# Patient Record
Sex: Female | Born: 1935
Health system: Southern US, Community
[De-identification: ages and names within clinical notes are randomized; demographics above are authoritative.]

## PROBLEM LIST (undated history)

## (undated) DIAGNOSIS — E871 Hypo-osmolality and hyponatremia: Secondary | ICD-10-CM

## (undated) DIAGNOSIS — I4891 Unspecified atrial fibrillation: Secondary | ICD-10-CM

## (undated) DIAGNOSIS — I509 Heart failure, unspecified: Secondary | ICD-10-CM

## (undated) DIAGNOSIS — I272 Pulmonary hypertension, unspecified: Secondary | ICD-10-CM

## (undated) DIAGNOSIS — M1990 Unspecified osteoarthritis, unspecified site: Secondary | ICD-10-CM

## (undated) DIAGNOSIS — E7800 Pure hypercholesterolemia, unspecified: Secondary | ICD-10-CM

## (undated) DIAGNOSIS — R011 Cardiac murmur, unspecified: Secondary | ICD-10-CM

## (undated) DIAGNOSIS — I10 Essential (primary) hypertension: Secondary | ICD-10-CM

## (undated) DIAGNOSIS — T7840XA Allergy, unspecified, initial encounter: Secondary | ICD-10-CM

## (undated) DIAGNOSIS — N189 Chronic kidney disease, unspecified: Secondary | ICD-10-CM

## (undated) DIAGNOSIS — E039 Hypothyroidism, unspecified: Secondary | ICD-10-CM

## (undated) DIAGNOSIS — I519 Heart disease, unspecified: Secondary | ICD-10-CM

## (undated) DIAGNOSIS — K219 Gastro-esophageal reflux disease without esophagitis: Secondary | ICD-10-CM

## (undated) DIAGNOSIS — Z872 Personal history of diseases of the skin and subcutaneous tissue: Secondary | ICD-10-CM

## (undated) HISTORY — PX: CORNEAL TRANSPLANT: SHX108

## (undated) HISTORY — DX: Unspecified atrial fibrillation: I48.91

## (undated) HISTORY — DX: Hypothyroidism, unspecified: E03.9

## (undated) HISTORY — DX: Pulmonary hypertension, unspecified: I27.20

## (undated) HISTORY — PX: ABDOMINAL HYSTERECTOMY: SHX81

## (undated) HISTORY — DX: Personal history of diseases of the skin and subcutaneous tissue: Z87.2

## (undated) HISTORY — DX: Pure hypercholesterolemia, unspecified: E78.00

## (undated) HISTORY — PX: KNEE SURGERY: SHX244

## (undated) HISTORY — PX: CHOLECYSTECTOMY: SHX55

## (undated) HISTORY — DX: Unspecified osteoarthritis, unspecified site: M19.90

## (undated) HISTORY — DX: Chronic kidney disease, unspecified: N18.9

## (undated) HISTORY — DX: Allergy, unspecified, initial encounter: T78.40XA

## (undated) HISTORY — DX: Cardiac murmur, unspecified: R01.1

## (undated) HISTORY — DX: Heart disease, unspecified: I51.9

## (undated) HISTORY — DX: Essential (primary) hypertension: I10

## (undated) HISTORY — DX: Gastro-esophageal reflux disease without esophagitis: K21.9

## (undated) HISTORY — DX: Heart failure, unspecified: I50.9

## (undated) HISTORY — PX: CYST REMOVAL TRUNK: SHX6283

## (undated) HISTORY — DX: Hypo-osmolality and hyponatremia: E87.1

---

## 2004-03-07 ENCOUNTER — Other Ambulatory Visit: Payer: Self-pay

## 2004-04-27 ENCOUNTER — Ambulatory Visit: Payer: Self-pay | Admitting: Unknown Physician Specialty

## 2004-09-20 ENCOUNTER — Inpatient Hospital Stay: Payer: Self-pay | Admitting: Internal Medicine

## 2004-09-20 ENCOUNTER — Other Ambulatory Visit: Payer: Self-pay

## 2005-01-29 ENCOUNTER — Ambulatory Visit: Payer: Self-pay | Admitting: Internal Medicine

## 2005-02-20 ENCOUNTER — Ambulatory Visit: Payer: Self-pay | Admitting: Unknown Physician Specialty

## 2006-01-30 ENCOUNTER — Ambulatory Visit: Payer: Self-pay | Admitting: Internal Medicine

## 2007-05-13 ENCOUNTER — Ambulatory Visit: Payer: Self-pay | Admitting: Internal Medicine

## 2007-10-24 ENCOUNTER — Ambulatory Visit: Payer: Self-pay | Admitting: Internal Medicine

## 2008-03-02 ENCOUNTER — Emergency Department: Payer: Self-pay | Admitting: Emergency Medicine

## 2008-05-17 ENCOUNTER — Ambulatory Visit: Payer: Self-pay | Admitting: Internal Medicine

## 2008-07-30 ENCOUNTER — Ambulatory Visit: Payer: Self-pay | Admitting: Unknown Physician Specialty

## 2008-08-04 ENCOUNTER — Ambulatory Visit: Payer: Self-pay | Admitting: Unknown Physician Specialty

## 2008-08-10 ENCOUNTER — Ambulatory Visit: Payer: Self-pay | Admitting: Unknown Physician Specialty

## 2009-05-10 ENCOUNTER — Ambulatory Visit: Payer: Self-pay | Admitting: Urology

## 2009-05-23 ENCOUNTER — Ambulatory Visit: Payer: Self-pay | Admitting: Internal Medicine

## 2010-04-27 ENCOUNTER — Ambulatory Visit: Payer: Self-pay | Admitting: Internal Medicine

## 2010-08-03 ENCOUNTER — Ambulatory Visit: Payer: Self-pay | Admitting: Internal Medicine

## 2010-08-30 ENCOUNTER — Ambulatory Visit: Payer: Self-pay | Admitting: Unknown Physician Specialty

## 2011-02-06 ENCOUNTER — Ambulatory Visit: Payer: Self-pay | Admitting: Internal Medicine

## 2012-03-28 DIAGNOSIS — I2510 Atherosclerotic heart disease of native coronary artery without angina pectoris: Secondary | ICD-10-CM | POA: Insufficient documentation

## 2013-03-31 ENCOUNTER — Ambulatory Visit: Payer: Self-pay | Admitting: Cardiology

## 2013-03-31 LAB — BASIC METABOLIC PANEL
Anion Gap: 6 — ABNORMAL LOW (ref 7–16)
BUN: 25 mg/dL — ABNORMAL HIGH (ref 7–18)
Calcium, Total: 8.9 mg/dL (ref 8.5–10.1)
Chloride: 102 mmol/L (ref 98–107)
Co2: 26 mmol/L (ref 21–32)
EGFR (African American): 40 — ABNORMAL LOW
EGFR (Non-African Amer.): 35 — ABNORMAL LOW
Osmolality: 273 (ref 275–301)
Potassium: 4 mmol/L (ref 3.5–5.1)
Sodium: 134 mmol/L — ABNORMAL LOW (ref 136–145)

## 2013-03-31 LAB — CBC WITH DIFFERENTIAL/PLATELET
Basophil %: 1.3 %
Eosinophil #: 0.1 10*3/uL (ref 0.0–0.7)
HCT: 35.9 % (ref 35.0–47.0)
Lymphocyte #: 1.5 10*3/uL (ref 1.0–3.6)
MCHC: 33.8 g/dL (ref 32.0–36.0)
MCV: 84 fL (ref 80–100)
Neutrophil %: 69.7 %
RBC: 4.25 10*6/uL (ref 3.80–5.20)
RDW: 14.3 % (ref 11.5–14.5)
WBC: 7.6 10*3/uL (ref 3.6–11.0)

## 2013-03-31 LAB — PROTIME-INR: Prothrombin Time: 13.9 secs (ref 11.5–14.7)

## 2013-04-02 ENCOUNTER — Ambulatory Visit: Payer: Self-pay | Admitting: Cardiology

## 2013-04-07 ENCOUNTER — Ambulatory Visit: Payer: Self-pay | Admitting: Internal Medicine

## 2013-05-03 HISTORY — PX: PACEMAKER INSERTION: SHX728

## 2013-05-18 ENCOUNTER — Ambulatory Visit (INDEPENDENT_AMBULATORY_CARE_PROVIDER_SITE_OTHER): Payer: Medicare Other | Admitting: Internal Medicine

## 2013-05-18 ENCOUNTER — Encounter: Payer: Self-pay | Admitting: Internal Medicine

## 2013-05-18 VITALS — BP 120/70 | HR 70 | Temp 97.7°F | Ht 63.0 in | Wt 173.0 lb

## 2013-05-18 DIAGNOSIS — Z9109 Other allergy status, other than to drugs and biological substances: Secondary | ICD-10-CM

## 2013-05-18 DIAGNOSIS — I10 Essential (primary) hypertension: Secondary | ICD-10-CM

## 2013-05-18 DIAGNOSIS — I4891 Unspecified atrial fibrillation: Secondary | ICD-10-CM

## 2013-05-18 DIAGNOSIS — Z23 Encounter for immunization: Secondary | ICD-10-CM

## 2013-05-18 DIAGNOSIS — E7800 Pure hypercholesterolemia, unspecified: Secondary | ICD-10-CM

## 2013-05-18 DIAGNOSIS — E039 Hypothyroidism, unspecified: Secondary | ICD-10-CM

## 2013-05-20 ENCOUNTER — Encounter: Payer: Self-pay | Admitting: Internal Medicine

## 2013-05-20 DIAGNOSIS — I10 Essential (primary) hypertension: Secondary | ICD-10-CM | POA: Insufficient documentation

## 2013-05-20 DIAGNOSIS — I4891 Unspecified atrial fibrillation: Secondary | ICD-10-CM | POA: Insufficient documentation

## 2013-05-20 DIAGNOSIS — Z9109 Other allergy status, other than to drugs and biological substances: Secondary | ICD-10-CM | POA: Insufficient documentation

## 2013-05-20 DIAGNOSIS — E7800 Pure hypercholesterolemia, unspecified: Secondary | ICD-10-CM | POA: Insufficient documentation

## 2013-05-20 DIAGNOSIS — E039 Hypothyroidism, unspecified: Secondary | ICD-10-CM | POA: Insufficient documentation

## 2013-05-20 NOTE — Assessment & Plan Note (Signed)
Controlled.  

## 2013-05-20 NOTE — Assessment & Plan Note (Signed)
Blood pressure doing well.  Same medication regimen.  Follow met b  

## 2013-05-20 NOTE — Progress Notes (Signed)
  Subjective:    Patient ID: Heidi Baldwin, female    DOB: Nov 07, 1923, 79 y.o.   MRN: [id number]  HPI 79 year old female with past history of hypertension, hypercholesterolemia, GERD, hypothyroidism and atrial fibrillation.  Recently diagnosed with atrial fib.  Had problems with bradycardia (while trying to treat her afib).  Subsequently had pacemaker placed.  Followed by cardiology.  Feels better.  On Eloquis.  No chest pain or tightness.  Breathing stable.  Eating and drinking well.  No nausea or vomiting.  Bowels stable.  Cramps are better.     Past Medical History  Diagnosis Date  . Arthritis   . Allergy   . Heart murmur   . Hypothyroidism   . Hypertension   . Hypercholesterolemia   . Atrial fibrillation     Outpatient Encounter Prescriptions as of 05/18/2013  Medication Sig Dispense Refill  . amLODipine (NORVASC) 5 MG tablet Take 5 mg by mouth daily.      Marland Kitchen apixaban (ELIQUIS) 5 MG TABS tablet Take 5 mg by mouth 2 (two) times daily.       Marland Kitchen levothyroxine (SYNTHROID, LEVOTHROID) 50 MCG tablet Take 50 mcg by mouth daily before breakfast.      . losartan (COZAAR) 100 MG tablet Take 100 mg by mouth daily.      . pantoprazole (PROTONIX) 40 MG tablet Take 40 mg by mouth 2 (two) times daily.       Marland Kitchen spironolactone (ALDACTONE) 25 MG tablet Take 25 mg by mouth daily.       No facility-administered encounter medications on file as of 05/18/2013.    Review of Systems Patient denies any headache, lightheadedness or dizziness.  No sinus or allergy symptoms.  No chest pain, tightness or palpitations.  No increased shortness of breath, cough or congestion.  No nausea or vomiting.  No acid reflux.  On protonix.  No abdominal pain or cramping.  No bowel change, such as diarrhea, constipation, BRBPR or melana.  No urine change.   Leg cramps better.  Feels better.      Objective:   Physical Exam Filed Vitals:   05/18/13 1329  BP: 120/70  Pulse: 70  Temp: 97.7 F (53.63 C)   79 year old female  in no acute distress.   HEENT:  Nares- clear.  Oropharynx - without lesions. NECK:  Supple.  Nontender.  No audible bruit.  HEART:  Appears to be regular. LUNGS:  No crackles or wheezing audible.  Respirations even and unlabored.  RADIAL PULSE:  Equal bilaterally.  CHEST WALL:  Incision site appears to be healing well.  Minimal tenderness.      ABDOMEN:  Soft, nontender.  Bowel sounds present and normal.  No audible abdominal bruit.    EXTREMITIES:  No increased edema present.  DP pulses palpable and equal bilaterally.          Assessment & Plan:  HEALTH MAINTENANCE.  Will schedule her for a physical next visit.  Obtain records for review.  Wants to hold on mammogram given recent pacemaker insertion.  Still sore.

## 2013-05-20 NOTE — Assessment & Plan Note (Signed)
Low cholesterol diet.  Follow lipid panel.    

## 2013-05-20 NOTE — Assessment & Plan Note (Signed)
On thyroid replacement.  Obtain outside labs.  Follow tsh.

## 2013-05-20 NOTE — Assessment & Plan Note (Signed)
Seeing cardiology.  Has pacemaker in place.  On eloquis.  Follow.

## 2013-05-21 ENCOUNTER — Telehealth: Payer: Self-pay | Admitting: Internal Medicine

## 2013-05-21 NOTE — Telephone Encounter (Signed)
Informed pt that we are completely out of samples. I am leaving her a card for a 30 day trial up front to pick up.

## 2013-05-21 NOTE — Telephone Encounter (Signed)
The patient has hit the doughnut hole for medicare and they want to charge her 160.00 for her medication co pay. She is wanting samples of apixaban (ELIQUIS) 5 MG TABS tablet.

## 2013-09-21 ENCOUNTER — Encounter: Payer: Self-pay | Admitting: Internal Medicine

## 2013-09-21 ENCOUNTER — Ambulatory Visit (INDEPENDENT_AMBULATORY_CARE_PROVIDER_SITE_OTHER): Payer: Medicare HMO | Admitting: Internal Medicine

## 2013-09-21 VITALS — BP 120/80 | HR 82 | Temp 98.0°F | Ht 63.0 in | Wt 176.0 lb

## 2013-09-21 DIAGNOSIS — E7800 Pure hypercholesterolemia, unspecified: Secondary | ICD-10-CM

## 2013-09-21 DIAGNOSIS — E039 Hypothyroidism, unspecified: Secondary | ICD-10-CM

## 2013-09-21 DIAGNOSIS — Z9109 Other allergy status, other than to drugs and biological substances: Secondary | ICD-10-CM

## 2013-09-21 DIAGNOSIS — N649 Disorder of breast, unspecified: Secondary | ICD-10-CM

## 2013-09-21 DIAGNOSIS — I4891 Unspecified atrial fibrillation: Secondary | ICD-10-CM

## 2013-09-21 DIAGNOSIS — I10 Essential (primary) hypertension: Secondary | ICD-10-CM

## 2013-09-21 DIAGNOSIS — L988 Other specified disorders of the skin and subcutaneous tissue: Secondary | ICD-10-CM

## 2013-09-21 NOTE — Assessment & Plan Note (Addendum)
Seeing cardiology.  Has pacemaker in place.  On eloquis.  Follow.  Soreness better.

## 2013-09-21 NOTE — Progress Notes (Signed)
  Subjective:    Patient ID: Heidi Baldwin, female    DOB: 1924-05-03, 80 y.o.   MRN: [id number]  HPI 80 year old female with past history of hypertension, hypercholesterolemia, GERD, hypothyroidism and atrial fibrillation.  Recently diagnosed with atrial fib.  Had problems with bradycardia (while trying to treat her afib).  Subsequently had pacemaker placed.  Followed by cardiology.  She comes in today to follow up on these issues as well as for  complete physical exam.  On Eloquis.  No chest pain or tightness.  Breathing stable.  Eating and drinking well.  No nausea or vomiting.  Bowels stable.  Has some issues with leg aching and some burning sensation in her feet.  Discussed other treatment options.  Discussed gabapentin.   She desires to continue tylenol for now.  Has a left breast lesion and some moles on her back.  Wants dermatology referral.  Soreness better around her pacemaker site.  Has improved.     Past Medical History  Diagnosis Date  . Arthritis   . Allergy   . Heart murmur   . Hypothyroidism   . Hypertension   . Hypercholesterolemia   . Atrial fibrillation     Outpatient Encounter Prescriptions as of 09/21/2013  Medication Sig  . amLODipine (NORVASC) 5 MG tablet Take 5 mg by mouth daily.  Marland Kitchen apixaban (ELIQUIS) 5 MG TABS tablet Take 5 mg by mouth 2 (two) times daily.   Marland Kitchen levothyroxine (SYNTHROID, LEVOTHROID) 50 MCG tablet Take 50 mcg by mouth daily before breakfast.  . losartan (COZAAR) 100 MG tablet Take 100 mg by mouth daily.  . pantoprazole (PROTONIX) 40 MG tablet Take 40 mg by mouth 2 (two) times daily.   Marland Kitchen spironolactone (ALDACTONE) 25 MG tablet Take 25 mg by mouth daily.    Review of Systems Patient denies any headache, lightheadedness or dizziness.  No sinus or allergy symptoms.  No chest pain, tightness or palpitations.  No increased shortness of breath, cough or congestion.  No nausea or vomiting.  No acid reflux.  On protonix.  No abdominal pain or cramping.  No bowel  change, such as diarrhea, constipation, BRBPR or melana.  No urine change.   Leg pain and burning in her feet as outlined.  See above.        Objective:   Physical Exam  Filed Vitals:   09/21/13 1331  BP: 120/80  Pulse: 82  Temp: 98 F (36.7 C)   Blood pressure recheck:  6/65  80 year old female in no acute distress.   HEENT:  Nares- clear.  Oropharynx - without lesions. NECK:  Supple.  Nontender.  No audible bruit.  HEART:  Appears to be regular. LUNGS:  No crackles or wheezing audible.  Respirations even and unlabored.  RADIAL PULSE:  Equal bilaterally.    BREASTS:  No nipple discharge or nipple retraction present.  Could not appreciate any distinct nodules or axillary adenopathy.  Erythematous lesion left breast.   ABDOMEN:  Soft, nontender.  Bowel sounds present and normal.  No audible abdominal bruit.  GU:  Not performed.    EXTREMITIES:  No increased edema present.  DP pulses palpable and equal bilaterally.          Assessment & Plan:  HEALTH MAINTENANCE.  Physical today.   Wants to hold on mammogram given recent pacemaker insertion.  Still sore.  Will let me know when agreeable.

## 2013-09-21 NOTE — Assessment & Plan Note (Addendum)
Controlled.  

## 2013-09-21 NOTE — Progress Notes (Signed)
Pre-visit discussion using our clinic review tool. No additional management support is needed unless otherwise documented below in the visit note.  

## 2013-09-21 NOTE — Assessment & Plan Note (Addendum)
Blood pressure doing well.  Same medication regimen.  Follow met b  

## 2013-09-21 NOTE — Assessment & Plan Note (Addendum)
Low cholesterol diet.  Follow lipid panel.    

## 2013-09-24 ENCOUNTER — Encounter: Payer: Self-pay | Admitting: Internal Medicine

## 2013-09-24 DIAGNOSIS — L988 Other specified disorders of the skin and subcutaneous tissue: Secondary | ICD-10-CM | POA: Insufficient documentation

## 2013-09-24 DIAGNOSIS — N649 Disorder of breast, unspecified: Secondary | ICD-10-CM | POA: Insufficient documentation

## 2013-09-24 NOTE — Assessment & Plan Note (Signed)
Persistent left breast skin lesion and moles on her back.  Refer to dermatology for evaluation.  Wants to see Dr Phillip Heal.

## 2013-09-24 NOTE — Assessment & Plan Note (Signed)
On thyroid replacement.  Follow tsh.  

## 2013-10-01 ENCOUNTER — Other Ambulatory Visit (INDEPENDENT_AMBULATORY_CARE_PROVIDER_SITE_OTHER): Payer: Medicare HMO

## 2013-10-01 DIAGNOSIS — E7800 Pure hypercholesterolemia, unspecified: Secondary | ICD-10-CM

## 2013-10-01 DIAGNOSIS — I10 Essential (primary) hypertension: Secondary | ICD-10-CM

## 2013-10-01 DIAGNOSIS — I4891 Unspecified atrial fibrillation: Secondary | ICD-10-CM

## 2013-10-01 LAB — COMPREHENSIVE METABOLIC PANEL
ALT: 17 U/L (ref 0–35)
AST: 20 U/L (ref 0–37)
Albumin: 4.3 g/dL (ref 3.5–5.2)
Alkaline Phosphatase: 61 U/L (ref 39–117)
BUN: 18 mg/dL (ref 6–23)
CO2: 27 mEq/L (ref 19–32)
CREATININE: 1.1 mg/dL (ref 0.4–1.2)
Calcium: 9.5 mg/dL (ref 8.4–10.5)
Chloride: 101 mEq/L (ref 96–112)
GFR: 50.89 mL/min — ABNORMAL LOW (ref >60.00)
Glucose, Bld: 94 mg/dL (ref 70–99)
POTASSIUM: 4.3 meq/L (ref 3.5–5.1)
SODIUM: 136 meq/L (ref 135–145)
Total Bilirubin: 0.9 mg/dL (ref 0.3–1.2)
Total Protein: 7.1 g/dL (ref 6.0–8.3)

## 2013-10-01 LAB — CBC WITH DIFFERENTIAL/PLATELET
Basophils Absolute: 0.1 10*3/uL (ref 0.0–0.1)
Basophils Relative: 1.1 % (ref 0.0–3.0)
EOS ABS: 0.1 10*3/uL (ref 0.0–0.7)
Eosinophils Relative: 1.8 % (ref 0.0–5.0)
HCT: 40.5 % (ref 36.0–46.0)
Hemoglobin: 13.4 g/dL (ref 12.0–15.0)
Lymphocytes Relative: 25.3 % (ref 12.0–46.0)
Lymphs Abs: 1.4 10*3/uL (ref 0.7–4.0)
MCHC: 33 g/dL (ref 30.0–36.0)
MCV: 86.9 fl (ref 78.0–100.0)
MONO ABS: 0.5 10*3/uL (ref 0.1–1.0)
Monocytes Relative: 8.5 % (ref 3.0–12.0)
NEUTROS PCT: 63.3 % (ref 43.0–77.0)
Neutro Abs: 3.6 10*3/uL (ref 1.4–7.7)
PLATELETS: 242 10*3/uL (ref 150.0–400.0)
RBC: 4.66 Mil/uL (ref 3.87–5.11)
RDW: 13.1 % (ref 11.5–14.6)
WBC: 5.7 10*3/uL (ref 4.5–10.5)

## 2013-10-01 LAB — LIPID PANEL
CHOLESTEROL: 213 mg/dL — AB (ref 0–200)
HDL: 75.5 mg/dL (ref >39.00)
LDL Cholesterol: 121 mg/dL — ABNORMAL HIGH (ref 0–99)
TRIGLYCERIDES: 81 mg/dL (ref 0.0–149.0)
Total CHOL/HDL Ratio: 3
VLDL: 16.2 mg/dL (ref 0.0–40.0)

## 2013-10-01 LAB — TSH: TSH: 1.99 u[IU]/mL (ref 0.35–5.50)

## 2013-10-02 ENCOUNTER — Encounter: Payer: Self-pay | Admitting: Internal Medicine

## 2013-10-08 ENCOUNTER — Encounter: Payer: Self-pay | Admitting: Internal Medicine

## 2013-10-08 DIAGNOSIS — M79676 Pain in unspecified toe(s): Principal | ICD-10-CM

## 2013-10-08 DIAGNOSIS — B351 Tinea unguium: Secondary | ICD-10-CM

## 2013-10-09 NOTE — Telephone Encounter (Signed)
Order placed for podiatry referral.   

## 2013-10-20 DIAGNOSIS — I517 Cardiomegaly: Secondary | ICD-10-CM | POA: Insufficient documentation

## 2013-10-20 DIAGNOSIS — I493 Ventricular premature depolarization: Secondary | ICD-10-CM | POA: Insufficient documentation

## 2013-10-21 ENCOUNTER — Encounter: Payer: Self-pay | Admitting: *Deleted

## 2013-11-20 DIAGNOSIS — Z872 Personal history of diseases of the skin and subcutaneous tissue: Secondary | ICD-10-CM

## 2013-11-20 HISTORY — DX: Personal history of diseases of the skin and subcutaneous tissue: Z87.2

## 2013-12-01 ENCOUNTER — Encounter: Payer: Self-pay | Admitting: Internal Medicine

## 2013-12-10 DIAGNOSIS — Z950 Presence of cardiac pacemaker: Secondary | ICD-10-CM | POA: Insufficient documentation

## 2013-12-15 ENCOUNTER — Telehealth: Payer: Self-pay | Admitting: Internal Medicine

## 2013-12-15 NOTE — Telephone Encounter (Signed)
[facility] fax  12/11/13  2:17 Jacques Navy [phone] Dr Serafina Royals Cpt [phone] Dx [phone] # visits 6 Start date 12/10/13 Expires 11/21.15

## 2014-01-20 ENCOUNTER — Encounter: Payer: Self-pay | Admitting: Internal Medicine

## 2014-01-20 ENCOUNTER — Ambulatory Visit (INDEPENDENT_AMBULATORY_CARE_PROVIDER_SITE_OTHER): Payer: Medicare HMO | Admitting: Internal Medicine

## 2014-01-20 VITALS — BP 130/70 | HR 78 | Temp 98.1°F | Ht 63.0 in | Wt 178.5 lb

## 2014-01-20 DIAGNOSIS — Z9109 Other allergy status, other than to drugs and biological substances: Secondary | ICD-10-CM

## 2014-01-20 DIAGNOSIS — M545 Low back pain, unspecified: Secondary | ICD-10-CM

## 2014-01-20 DIAGNOSIS — I4891 Unspecified atrial fibrillation: Secondary | ICD-10-CM

## 2014-01-20 DIAGNOSIS — I482 Chronic atrial fibrillation, unspecified: Secondary | ICD-10-CM

## 2014-01-20 DIAGNOSIS — I10 Essential (primary) hypertension: Secondary | ICD-10-CM

## 2014-01-20 DIAGNOSIS — M5430 Sciatica, unspecified side: Secondary | ICD-10-CM

## 2014-01-20 DIAGNOSIS — E039 Hypothyroidism, unspecified: Secondary | ICD-10-CM

## 2014-01-20 DIAGNOSIS — M5441 Lumbago with sciatica, right side: Secondary | ICD-10-CM

## 2014-01-20 DIAGNOSIS — E7800 Pure hypercholesterolemia, unspecified: Secondary | ICD-10-CM

## 2014-01-20 NOTE — Progress Notes (Signed)
Pre visit review using our clinic review tool, if applicable. No additional management support is needed unless otherwise documented below in the visit note. 

## 2014-01-21 ENCOUNTER — Ambulatory Visit: Payer: Medicare HMO | Admitting: Internal Medicine

## 2014-01-24 ENCOUNTER — Encounter: Payer: Self-pay | Admitting: Internal Medicine

## 2014-01-24 DIAGNOSIS — M549 Dorsalgia, unspecified: Secondary | ICD-10-CM | POA: Insufficient documentation

## 2014-01-24 NOTE — Assessment & Plan Note (Signed)
Back and hip pain as outlined.  Increased.  Will check L-S spine xray and right hip xray.  Avoid increased antiinflammatories and narcotics.  Intolerant to gabapentin.  Discussed referral to Dr Sharlet Salina.  Follow.

## 2014-01-24 NOTE — Assessment & Plan Note (Signed)
Blood pressure doing well.  Same medication regimen.  Follow met b  

## 2014-01-24 NOTE — Assessment & Plan Note (Signed)
On thyroid replacement.  Follow tsh.  

## 2014-01-24 NOTE — Progress Notes (Signed)
  Subjective:    Patient ID: Heidi Baldwin, female    DOB: [birth year], 80 y.o.   MRN: [id number]  HPI 80 year old female with past history of hypertension, hypercholesterolemia, GERD, hypothyroidism and atrial fibrillation.  Recently diagnosed with atrial fib.  Had problems with bradycardia (while trying to treat her afib).  Subsequently had pacemaker placed.  Followed by cardiology.  She comes in today for a scheduled follow up.  On Eloquis.  No chest pain or tightness.  Breathing stable.  Eating and drinking well.  No nausea or vomiting.  Bowels stable.  Has some issues with leg aching and some burning sensation in her feet.  Discussed other treatment options.  Discussed gabapentin.   States she has taken gabapentin previously and did not tolerate.  She desires to continue tylenol for now.   Increased pain recently.  Avoid increased antiinflammatories and narcotics.  Intolerant to gabapentin.      Past Medical History  Diagnosis Date  . Arthritis   . Allergy   . Heart murmur   . Hypothyroidism   . Hypertension   . Hypercholesterolemia   . Atrial fibrillation   . Hx of actinic keratosis May 2015    under left breast    Outpatient Encounter Prescriptions as of 01/20/2014  Medication Sig  . amLODipine (NORVASC) 5 MG tablet Take 5 mg by mouth daily.  Marland Kitchen apixaban (ELIQUIS) 5 MG TABS tablet Take 5 mg by mouth 2 (two) times daily.   Marland Kitchen levothyroxine (SYNTHROID, LEVOTHROID) 50 MCG tablet Take 50 mcg by mouth daily before breakfast.  . losartan (COZAAR) 100 MG tablet Take 100 mg by mouth daily.  . pantoprazole (PROTONIX) 40 MG tablet Take 40 mg by mouth 2 (two) times daily.   Marland Kitchen spironolactone (ALDACTONE) 25 MG tablet Take 25 mg by mouth daily.    Review of Systems Patient denies any headache, lightheadedness or dizziness.  No sinus or allergy symptoms.  No chest pain, tightness or palpitations.  No increased shortness of breath, cough or congestion.  No nausea or vomiting.  No acid reflux.  On  protonix.  No abdominal pain or cramping.  No bowel change, such as diarrhea, constipation, BRBPR or melana.  No urine change.   Leg pain and burning in her feet as outlined.  See above.  Increased pain.  Feels needs something done for the pain.       Objective:   Physical Exam  Filed Vitals:   01/20/14 1351  BP: 130/70  Pulse: 78  Temp: 98.1 F (64.12 C)   80 year old female in no acute distress.   HEENT:  Nares- clear.  Oropharynx - without lesions. NECK:  Supple.  Nontender.  No audible bruit.  HEART:  Appears to be regular. LUNGS:  No crackles or wheezing audible.  Respirations even and unlabored.  RADIAL PULSE:  Equal bilaterally.   ABDOMEN:  Soft, nontender.  Bowel sounds present and normal.  No audible abdominal bruit.    EXTREMITIES:  No increased edema present.  DP pulses palpable and equal bilaterally.          Assessment & Plan:  HEALTH MAINTENANCE.  Physical 10/01/13.   Still wants to hold on mammogram given recent pacemaker insertion.  Still sore.  Will let me know when agreeable.   I spent 25 minutes with the patient and more than 50% of the time was spent in consultation regarding the above.

## 2014-01-24 NOTE — Assessment & Plan Note (Signed)
Seeing cardiology.  Has pacemaker in place.  On eloquis.  Follow.  Soreness better.  Still present.  Follow.

## 2014-01-24 NOTE — Assessment & Plan Note (Signed)
Low cholesterol diet.  Follow lipid panel.    

## 2014-01-24 NOTE — Assessment & Plan Note (Signed)
Controlled.  

## 2014-02-03 ENCOUNTER — Other Ambulatory Visit (INDEPENDENT_AMBULATORY_CARE_PROVIDER_SITE_OTHER): Payer: Medicare HMO

## 2014-02-03 DIAGNOSIS — I10 Essential (primary) hypertension: Secondary | ICD-10-CM

## 2014-02-03 LAB — COMPREHENSIVE METABOLIC PANEL
ALBUMIN: 4 g/dL (ref 3.5–5.2)
ALK PHOS: 57 U/L (ref 39–117)
ALT: 16 U/L (ref 0–35)
AST: 22 U/L (ref 0–37)
BUN: 20 mg/dL (ref 6–23)
CO2: 28 mEq/L (ref 19–32)
Calcium: 9.4 mg/dL (ref 8.4–10.5)
Chloride: 99 mEq/L (ref 96–112)
Creatinine, Ser: 1.1 mg/dL (ref 0.4–1.2)
GFR: 47.77 mL/min — ABNORMAL LOW (ref >60.00)
Glucose, Bld: 108 mg/dL — ABNORMAL HIGH (ref 70–99)
POTASSIUM: 4.3 meq/L (ref 3.5–5.1)
SODIUM: 134 meq/L — AB (ref 135–145)
TOTAL PROTEIN: 7 g/dL (ref 6.0–8.3)
Total Bilirubin: 0.9 mg/dL (ref 0.2–1.2)

## 2014-02-03 LAB — LIPID PANEL
Cholesterol: 204 mg/dL — ABNORMAL HIGH (ref 0–200)
HDL: 70.8 mg/dL (ref >39.00)
LDL CALC: 120 mg/dL — AB (ref 0–99)
NonHDL: 133.2
Total CHOL/HDL Ratio: 3
Triglycerides: 66 mg/dL (ref 0.0–149.0)
VLDL: 13.2 mg/dL (ref 0.0–40.0)

## 2014-02-04 ENCOUNTER — Encounter: Payer: Self-pay | Admitting: Internal Medicine

## 2014-02-04 ENCOUNTER — Other Ambulatory Visit: Payer: Self-pay | Admitting: Internal Medicine

## 2014-02-04 DIAGNOSIS — R739 Hyperglycemia, unspecified: Secondary | ICD-10-CM

## 2014-02-04 DIAGNOSIS — E871 Hypo-osmolality and hyponatremia: Secondary | ICD-10-CM

## 2014-02-04 NOTE — Progress Notes (Signed)
Order placed for f/u lab.   

## 2014-02-04 NOTE — Progress Notes (Signed)
Pt scheduled and notified

## 2014-02-08 NOTE — Telephone Encounter (Signed)
Mailed unread message to pt  

## 2014-02-11 ENCOUNTER — Other Ambulatory Visit (INDEPENDENT_AMBULATORY_CARE_PROVIDER_SITE_OTHER): Payer: Medicare HMO

## 2014-02-11 DIAGNOSIS — E871 Hypo-osmolality and hyponatremia: Secondary | ICD-10-CM

## 2014-02-11 DIAGNOSIS — R7309 Other abnormal glucose: Secondary | ICD-10-CM

## 2014-02-11 DIAGNOSIS — R739 Hyperglycemia, unspecified: Secondary | ICD-10-CM

## 2014-02-11 LAB — HEMOGLOBIN A1C: Hgb A1c MFr Bld: 5.8 % (ref 4.6–6.5)

## 2014-02-11 LAB — SODIUM: Sodium: 137 mEq/L (ref 135–145)

## 2014-02-12 ENCOUNTER — Encounter: Payer: Self-pay | Admitting: Internal Medicine

## 2014-02-12 LAB — GLUCOSE, FASTING: GLUCOSE, FASTING: 83 mg/dL (ref 70–99)

## 2014-04-13 ENCOUNTER — Encounter: Payer: Self-pay | Admitting: Internal Medicine

## 2014-04-13 DIAGNOSIS — M25551 Pain in right hip: Secondary | ICD-10-CM

## 2014-04-14 NOTE — Telephone Encounter (Signed)
Order placed for referral to ortho 

## 2014-04-23 ENCOUNTER — Ambulatory Visit: Payer: Medicare HMO | Admitting: Internal Medicine

## 2014-04-29 DIAGNOSIS — M1710 Unilateral primary osteoarthritis, unspecified knee: Secondary | ICD-10-CM | POA: Insufficient documentation

## 2014-04-29 DIAGNOSIS — M179 Osteoarthritis of knee, unspecified: Secondary | ICD-10-CM | POA: Insufficient documentation

## 2014-05-19 ENCOUNTER — Ambulatory Visit: Payer: Medicare HMO | Admitting: Internal Medicine

## 2014-05-22 ENCOUNTER — Other Ambulatory Visit: Payer: Self-pay | Admitting: Internal Medicine

## 2014-05-25 DIAGNOSIS — I340 Nonrheumatic mitral (valve) insufficiency: Secondary | ICD-10-CM | POA: Insufficient documentation

## 2014-05-25 DIAGNOSIS — R001 Bradycardia, unspecified: Secondary | ICD-10-CM | POA: Insufficient documentation

## 2014-05-25 DIAGNOSIS — I495 Sick sinus syndrome: Secondary | ICD-10-CM | POA: Insufficient documentation

## 2014-05-25 DIAGNOSIS — I272 Pulmonary hypertension, unspecified: Secondary | ICD-10-CM | POA: Insufficient documentation

## 2014-06-22 ENCOUNTER — Other Ambulatory Visit: Payer: Self-pay | Admitting: Internal Medicine

## 2014-07-09 ENCOUNTER — Ambulatory Visit (INDEPENDENT_AMBULATORY_CARE_PROVIDER_SITE_OTHER): Payer: Medicare HMO | Admitting: Internal Medicine

## 2014-07-09 ENCOUNTER — Encounter: Payer: Self-pay | Admitting: Internal Medicine

## 2014-07-09 VITALS — BP 140/80 | HR 77 | Temp 98.5°F | Ht 63.0 in | Wt 173.2 lb

## 2014-07-09 DIAGNOSIS — E7800 Pure hypercholesterolemia, unspecified: Secondary | ICD-10-CM

## 2014-07-09 DIAGNOSIS — I482 Chronic atrial fibrillation, unspecified: Secondary | ICD-10-CM

## 2014-07-09 DIAGNOSIS — R739 Hyperglycemia, unspecified: Secondary | ICD-10-CM

## 2014-07-09 DIAGNOSIS — L988 Other specified disorders of the skin and subcutaneous tissue: Secondary | ICD-10-CM

## 2014-07-09 DIAGNOSIS — N649 Disorder of breast, unspecified: Secondary | ICD-10-CM

## 2014-07-09 DIAGNOSIS — I10 Essential (primary) hypertension: Secondary | ICD-10-CM

## 2014-07-09 DIAGNOSIS — E039 Hypothyroidism, unspecified: Secondary | ICD-10-CM

## 2014-07-09 DIAGNOSIS — Z23 Encounter for immunization: Secondary | ICD-10-CM

## 2014-07-09 DIAGNOSIS — E669 Obesity, unspecified: Secondary | ICD-10-CM

## 2014-07-09 DIAGNOSIS — Z1239 Encounter for other screening for malignant neoplasm of breast: Secondary | ICD-10-CM

## 2014-07-09 NOTE — Progress Notes (Signed)
Pre visit review using our clinic review tool, if applicable. No additional management support is needed unless otherwise documented below in the visit note. 

## 2014-07-18 ENCOUNTER — Encounter: Payer: Self-pay | Admitting: Internal Medicine

## 2014-07-18 DIAGNOSIS — E669 Obesity, unspecified: Secondary | ICD-10-CM | POA: Insufficient documentation

## 2014-07-18 NOTE — Progress Notes (Signed)
Subjective:    Patient ID: Heidi Baldwin, female    DOB: 1924-07-01, 80 y.o.   MRN: [id number]  HPI 80 year old female with past history of hypertension, hypercholesterolemia, GERD, hypothyroidism and atrial fibrillation.  Had problems with bradycardia (while trying to treat her afib).  Subsequently had pacemaker placed.  Followed by cardiology.  She comes in today for a scheduled follow up.  On Eloquis.  No chest pain or tightness.  Breathing stable.  Eating and drinking well.  No nausea or vomiting.  Bowels stable.  Has some issues with leg aching and some burning sensation in her feet.  Have discussed other treatment options.  Discussed gabapentin.   States she has taken gabapentin previously and did not tolerate.  She desires to continue tylenol.   Stable.  She had pneumovax in 2009.  Discussed prevnar.  Has persistent skin lesion uner her left breast.  Refer to dermatology.      Past Medical History  Diagnosis Date  . Arthritis   . Allergy   . Heart murmur   . Hypothyroidism   . Hypertension   . Hypercholesterolemia   . Atrial fibrillation   . Hx of actinic keratosis May 2015    under left breast    Outpatient Encounter Prescriptions as of 07/09/2014  Medication Sig  . amLODipine (NORVASC) 5 MG tablet TAKE 1 TABLET (5 MG TOTAL) BY MOUTH DAILY.  Marland Kitchen apixaban (ELIQUIS) 5 MG TABS tablet Take 5 mg by mouth 2 (two) times daily.   Marland Kitchen levothyroxine (SYNTHROID, LEVOTHROID) 50 MCG tablet TAKE 1 TABLET BY MOUTH DAILY ON EMPTY STOMACH WITH GLASS OF WATER 30-60MINS BEFORE BREAKFAST  . losartan (COZAAR) 100 MG tablet TAKE 1 TABLET BY MOUTH EVERY DAY  . pantoprazole (PROTONIX) 40 MG tablet Take 40 mg by mouth 2 (two) times daily.   Marland Kitchen spironolactone (ALDACTONE) 25 MG tablet TAKE 1/2 TABLET BY MOUTH EVERY DAY    Review of Systems Patient denies any headache, lightheadedness or dizziness.  No sinus or allergy symptoms.  No chest pain, tightness or palpitations. No increased shortness of breath, cough  or congestion.  No nausea or vomiting.  No acid reflux.  On protonix.  No abdominal pain or cramping.  No bowel change, such as diarrhea, constipation, BRBPR or melana.  No urine change.   Leg pain and burning in her feet as outlined.  See above. Stable.  Some arthritis.  Overall she feels she is doing well.       Objective:   Physical Exam  Filed Vitals:   07/09/14 1137  BP: 140/80  Pulse: 77  Temp: 98.5 F (58.52 C)   80 year old female in no acute distress.   HEENT:  Nares- clear.  Oropharynx - without lesions. NECK:  Supple.  Nontender.  No audible bruit.  HEART:  Appears to be regular. LUNGS:  No crackles or wheezing audible.  Respirations even and unlabored.  RADIAL PULSE:  Equal bilaterally.   ABDOMEN:  Soft, nontender.  Bowel sounds present and normal.  No audible abdominal bruit.    EXTREMITIES:  No increased edema present.  DP pulses palpable and equal bilaterally.          Assessment & Plan:  1. Need for prophylactic vaccination against Streptococcus pneumoniae (pneumococcus) - Pneumococcal conjugate vaccine 13-valent  2. Obesity (BMI 30-39.9) Diet and exercise.   3. Skin lesion of breast Refer to dermatology.  Pt request Dr Nehemiah Massed.   4. Hypercholesterolemia Low cholesterol diet and exercise.    -  Lipid panel; Future - Hepatic function panel; Future  5. Hypothyroidism, unspecified hypothyroidism type On thyroid replacement.  Follow tsh.    6. Essential hypertension, benign Blood pressure under reasonable control.  Follow.  Same medication regimen.   - Basic metabolic panel; Future  7. Chronic atrial fibrillation On eloquis.  Rate controlled. Followed by cardiology.    8. Hyperglycemia Low carb diet.  - Hemoglobin A1c; Future  HEALTH MAINTENANCE.  Physical 10/01/13.   Still had wanted to hold on mammogram given recent pacemaker insertion.  She is agreeable now.  Schedule mammogram.    I spent 25 minutes with the patient and more than 50% of the time was  spent in consultation regarding the above.

## 2014-07-27 ENCOUNTER — Encounter: Payer: Self-pay | Admitting: Internal Medicine

## 2014-08-03 ENCOUNTER — Other Ambulatory Visit (INDEPENDENT_AMBULATORY_CARE_PROVIDER_SITE_OTHER): Payer: Commercial Managed Care - HMO

## 2014-08-03 DIAGNOSIS — R739 Hyperglycemia, unspecified: Secondary | ICD-10-CM

## 2014-08-03 DIAGNOSIS — E7800 Pure hypercholesterolemia, unspecified: Secondary | ICD-10-CM

## 2014-08-03 DIAGNOSIS — I10 Essential (primary) hypertension: Secondary | ICD-10-CM | POA: Diagnosis not present

## 2014-08-03 LAB — BASIC METABOLIC PANEL
BUN: 18 mg/dL (ref 6–23)
CHLORIDE: 102 meq/L (ref 96–112)
CO2: 29 mEq/L (ref 19–32)
Calcium: 9.6 mg/dL (ref 8.4–10.5)
Creatinine, Ser: 1 mg/dL (ref 0.4–1.2)
GFR: 53.64 mL/min — ABNORMAL LOW (ref >60.00)
Glucose, Bld: 92 mg/dL (ref 70–99)
Potassium: 5 mEq/L (ref 3.5–5.1)
Sodium: 136 mEq/L (ref 135–145)

## 2014-08-03 LAB — LIPID PANEL
CHOL/HDL RATIO: 3
Cholesterol: 202 mg/dL — ABNORMAL HIGH (ref 0–200)
HDL: 64 mg/dL (ref >39.00)
LDL CALC: 122 mg/dL — AB (ref 0–99)
NONHDL: 138
TRIGLYCERIDES: 78 mg/dL (ref 0.0–149.0)
VLDL: 15.6 mg/dL (ref 0.0–40.0)

## 2014-08-03 LAB — HEPATIC FUNCTION PANEL
ALK PHOS: 67 U/L (ref 39–117)
ALT: 16 U/L (ref 0–35)
AST: 22 U/L (ref 0–37)
Albumin: 4.4 g/dL (ref 3.5–5.2)
BILIRUBIN DIRECT: 0.2 mg/dL (ref 0.0–0.3)
TOTAL PROTEIN: 7 g/dL (ref 6.0–8.3)
Total Bilirubin: 1 mg/dL (ref 0.2–1.2)

## 2014-08-03 LAB — HEMOGLOBIN A1C: HEMOGLOBIN A1C: 5.8 % (ref 4.6–6.5)

## 2014-08-04 ENCOUNTER — Encounter: Payer: Self-pay | Admitting: Internal Medicine

## 2014-08-05 ENCOUNTER — Ambulatory Visit: Payer: Self-pay | Admitting: Internal Medicine

## 2014-08-05 DIAGNOSIS — Z1231 Encounter for screening mammogram for malignant neoplasm of breast: Secondary | ICD-10-CM | POA: Diagnosis not present

## 2014-08-05 LAB — HM MAMMOGRAPHY: HM Mammogram: NEGATIVE

## 2014-08-06 ENCOUNTER — Encounter: Payer: Self-pay | Admitting: *Deleted

## 2014-08-19 DIAGNOSIS — R21 Rash and other nonspecific skin eruption: Secondary | ICD-10-CM | POA: Diagnosis not present

## 2014-08-19 DIAGNOSIS — L918 Other hypertrophic disorders of the skin: Secondary | ICD-10-CM | POA: Diagnosis not present

## 2014-08-19 DIAGNOSIS — L821 Other seborrheic keratosis: Secondary | ICD-10-CM | POA: Diagnosis not present

## 2014-08-20 ENCOUNTER — Encounter: Payer: Self-pay | Admitting: Internal Medicine

## 2014-10-11 ENCOUNTER — Other Ambulatory Visit: Payer: Self-pay | Admitting: Internal Medicine

## 2014-10-27 DIAGNOSIS — Z947 Corneal transplant status: Secondary | ICD-10-CM | POA: Diagnosis not present

## 2014-11-08 ENCOUNTER — Ambulatory Visit (INDEPENDENT_AMBULATORY_CARE_PROVIDER_SITE_OTHER): Payer: Commercial Managed Care - HMO | Admitting: Internal Medicine

## 2014-11-08 ENCOUNTER — Encounter: Payer: Self-pay | Admitting: Internal Medicine

## 2014-11-08 VITALS — BP 138/80 | HR 85 | Temp 97.9°F | Ht 63.0 in | Wt 176.2 lb

## 2014-11-08 DIAGNOSIS — I10 Essential (primary) hypertension: Secondary | ICD-10-CM

## 2014-11-08 DIAGNOSIS — I482 Chronic atrial fibrillation, unspecified: Secondary | ICD-10-CM

## 2014-11-08 DIAGNOSIS — R739 Hyperglycemia, unspecified: Secondary | ICD-10-CM

## 2014-11-08 DIAGNOSIS — L988 Other specified disorders of the skin and subcutaneous tissue: Secondary | ICD-10-CM

## 2014-11-08 DIAGNOSIS — Z0000 Encounter for general adult medical examination without abnormal findings: Secondary | ICD-10-CM

## 2014-11-08 DIAGNOSIS — E669 Obesity, unspecified: Secondary | ICD-10-CM

## 2014-11-08 DIAGNOSIS — M545 Low back pain: Secondary | ICD-10-CM | POA: Diagnosis not present

## 2014-11-08 DIAGNOSIS — E039 Hypothyroidism, unspecified: Secondary | ICD-10-CM | POA: Diagnosis not present

## 2014-11-08 DIAGNOSIS — E7800 Pure hypercholesterolemia, unspecified: Secondary | ICD-10-CM

## 2014-11-08 DIAGNOSIS — N649 Disorder of breast, unspecified: Secondary | ICD-10-CM

## 2014-11-08 NOTE — Progress Notes (Signed)
Patient ID: Heidi Baldwin, female   DOB: [birth year], 81 y.o.   MRN: [id number]   Subjective:    Patient ID: Heidi Baldwin, female    DOB: [birth year], 81 y.o.   MRN: [id number]  HPI  Patient here for a physical exam.  Overall appears to be doing relatively well.  Her leg pain (arthritis) is her main complaint.  She is only taking tylenol occasionally.  We discussed taking tylenol on a scheduled basis.  Eating and drinking well.  Breathing stable.  Bowels stable.  Saw dermatology.  Diagnosed with psoriasis.  Better.     Past Medical History  Diagnosis Date  . Arthritis   . Allergy   . Heart murmur   . Hypothyroidism   . Hypertension   . Hypercholesterolemia   . Atrial fibrillation   . Hx of actinic keratosis May 2015    under left breast    Current Outpatient Prescriptions on File Prior to Visit  Medication Sig Dispense Refill  . amLODipine (NORVASC) 5 MG tablet TAKE 1 TABLET (5 MG TOTAL) BY MOUTH DAILY. 90 tablet 1  . apixaban (ELIQUIS) 5 MG TABS tablet Take 5 mg by mouth 2 (two) times daily.     Marland Kitchen levothyroxine (SYNTHROID, LEVOTHROID) 50 MCG tablet TAKE 1 TABLET BY MOUTH DAILY ON EMPTY STOMACH WITH GLASS OF WATER 30-60MINS BEFORE BREAKFAST 90 tablet 3  . losartan (COZAAR) 100 MG tablet TAKE 1 TABLET BY MOUTH EVERY DAY 90 tablet 1  . pantoprazole (PROTONIX) 40 MG tablet TAKE 1 TABLET BY MOUTH TWICE A DAY 180 tablet 1  . spironolactone (ALDACTONE) 25 MG tablet TAKE 1/2 TABLET BY MOUTH EVERY DAY 45 tablet 1   No current facility-administered medications on file prior to visit.    Review of Systems  Constitutional: Negative for appetite change and unexpected weight change.  HENT: Negative for congestion and sinus pressure.   Eyes: Negative for pain and visual disturbance.  Respiratory: Negative for cough, chest tightness and shortness of breath.   Cardiovascular: Negative for chest pain, palpitations and leg swelling.  Gastrointestinal: Negative for nausea, abdominal pain and diarrhea.   Genitourinary: Negative for dysuria and difficulty urinating.  Musculoskeletal: Negative for joint swelling.       Pain with arthritis.  Leg pain.    Skin: Negative for color change and rash.  Neurological: Negative for dizziness, light-headedness and headaches.  Hematological: Negative for adenopathy. Does not bruise/bleed easily.  Psychiatric/Behavioral: Negative for dysphoric mood and agitation.       Objective:     Blood pressure recheck 128/68  Physical Exam  Constitutional: She is oriented to person, place, and time. She appears well-developed and well-nourished.  HENT:  Nose: Nose normal.  Mouth/Throat: Oropharynx is clear and moist.  Eyes: Right eye exhibits no discharge. Left eye exhibits no discharge. No scleral icterus.  Neck: Neck supple. No thyromegaly present.  Cardiovascular: Normal rate and regular rhythm.   2/6 systolic murmur.    Pulmonary/Chest: Breath sounds normal. No accessory muscle usage. No tachypnea. No respiratory distress. She has no decreased breath sounds. She has no wheezes. She has no rhonchi. Right breast exhibits no inverted nipple, no mass, no nipple discharge and no tenderness (no axillary adenopathy). Left breast exhibits no inverted nipple, no mass, no nipple discharge and no tenderness (no axilarry adenopathy).  Abdominal: Soft. Bowel sounds are normal. There is no tenderness.  Musculoskeletal: She exhibits no edema or tenderness.  Lymphadenopathy:    She has no cervical adenopathy.  Neurological: She is alert and oriented to person, place, and time.  Skin: Skin is warm. No rash noted.  Psychiatric: She has a normal mood and affect. Her behavior is normal.    BP 138/80 mmHg  Pulse 85  Temp(Src) 97.9 F (36.6 C) (Oral)  Ht 5\' 3"  (1.6 m)  Wt 176 lb 4 oz (79.946 kg)  BMI 31.23 kg/m2  SpO2 96% Wt Readings from Last 3 Encounters:  11/08/14 176 lb 4 oz (79.946 kg)  07/09/14 173 lb 4 oz (78.586 kg)  01/20/14 178 lb 8 oz (80.967 kg)      Lab Results  Component Value Date   WBC 5.7 10/01/2013   HGB 13.4 10/01/2013   HCT 40.5 10/01/2013   PLT 242.0 10/01/2013   GLUCOSE 92 08/03/2014   CHOL 202* 08/03/2014   TRIG 78.0 08/03/2014   HDL 64.00 08/03/2014   LDLCALC 122* 08/03/2014   ALT 16 08/03/2014   AST 22 08/03/2014   NA 136 08/03/2014   K 5.0 08/03/2014   CL 102 08/03/2014   CREATININE 1.0 08/03/2014   BUN 18 08/03/2014   CO2 29 08/03/2014   TSH 1.99 10/01/2013   HGBA1C 5.8 08/03/2014       Assessment & Plan:   Problem List Items Addressed This Visit    Atrial fibrillation - Primary    Seeing cardiology.  Has pacemaker in place.  On eliquis.  Stable.        Relevant Orders   CBC with Differential/Platelet   Back pain    Discussed taking tylenol scheduled.  Avoid increased antiinflammatories.  Intolerant to gabapentin.  Follow.        Essential hypertension, benign    Blood pressure doing well.  Same medication regimen.  Follow pressures.  Follow metabolic panel.        Relevant Orders   Basic metabolic panel   Health care maintenance    Physical 11/08/14.  Mammogram 08/05/14 - Birads I.        Hypercholesterolemia    Low cholesterol diet and exercise.  Follow lipid panel.        Relevant Orders   Lipid panel   Hepatic function panel   Hyperglycemia    Sugar slightly elevated.  Check a1c.        Relevant Orders   Hemoglobin A1c   Hypothyroidism    On thyroid replacement.  Follow tsh.        Relevant Orders   TSH   Obesity (BMI 30-39.9)    Diet and exercise.  Follow.        Skin lesion of breast    Saw dermatology.  Diagnosed with psoriasis.  Better.          I spent 25 minutes with the patient and more than 50% of the time was spent in consultation regarding the above.     Einar Pheasant, MD

## 2014-11-08 NOTE — Progress Notes (Signed)
Pre visit review using our clinic review tool, if applicable. No additional management support is needed unless otherwise documented below in the visit note. 

## 2014-11-12 DIAGNOSIS — I10 Essential (primary) hypertension: Secondary | ICD-10-CM | POA: Insufficient documentation

## 2014-11-12 NOTE — Op Note (Signed)
PATIENT NAME:  Heidi Baldwin, MAHRT MR#:  [id number] DATE OF BIRTH:  August 18, 1923  DATE OF PROCEDURE:  04/02/2013  PRIMARY CARE PHYSICIAN: Hortencia Pilar, MD  PREPROCEDURE DIAGNOSES:  1.  Sick sinus syndrome. 2.  Intermittent supraventricular tachycardia.   PROCEDURE: Single-chamber pacemaker implantation.   POSTPROCEDURE DIAGNOSIS: Ventricular pacing.   INDICATION: The patient is an 81 year old female with history of atrial fibrillation and intermittent SVT with bradycardia. The patient has had chronic atrial fibrillation and recently has been bradycardic with rates in the 50s associated with generalized fatigue and lack of exercise tolerance. The procedure, risks, benefits and alternatives of single pacemaker implantation were explained to the patient and informed written consent was obtained.   DESCRIPTION OF PROCEDURE: She was brought to the operating room in a fasting state. The left pectoral region was prepped and draped in the usual sterile manner. Anesthesia was obtained with 1% Xylocaine. A 6 cm incision was performed over the left pectoral region. The pacemaker pocket was generated by electrocautery and blunt dissection. Access was obtained to the left subclavian vein by fine needle aspiration. A ventricular lead was positioned into the right ventricular apical septum ([employer]tronic [id number]).  After proper thresholds were obtained, the lead was sutured in place. The lead was connected to a single-chamber rate responsive pacemaker generator ([employer]tronic Adapta [id number]). The pacemaker pocket was irrigated with gentamicin solution. The pacemaker generator was positioned in the pocket and the pocket was closed with 2-0 and 4-0 Vicryl, respectively. Steri-Strips and pressure dressing were applied.  ____________________________ Isaias Cowman, MD ap:sb D: 04/02/2013 13:00:28 ET T: 04/02/2013 13:23:43 ET JOB#: [id number]  cc: Isaias Cowman, MD, <Dictator> Isaias Cowman MD ELECTRONICALLY SIGNED  05/04/2013 11:22

## 2014-11-14 ENCOUNTER — Encounter: Payer: Self-pay | Admitting: Internal Medicine

## 2014-11-14 DIAGNOSIS — R739 Hyperglycemia, unspecified: Secondary | ICD-10-CM | POA: Insufficient documentation

## 2014-11-14 DIAGNOSIS — Z0000 Encounter for general adult medical examination without abnormal findings: Secondary | ICD-10-CM | POA: Insufficient documentation

## 2014-11-14 NOTE — Assessment & Plan Note (Signed)
Physical 11/08/14.  Mammogram 08/05/14 - Birads I.

## 2014-11-14 NOTE — Assessment & Plan Note (Signed)
On thyroid replacement.  Follow tsh.  

## 2014-11-14 NOTE — Assessment & Plan Note (Signed)
Seeing cardiology.  Has pacemaker in place.  On eliquis.  Stable.

## 2014-11-14 NOTE — Assessment & Plan Note (Signed)
Sugar slightly elevated.  Check a1c.

## 2014-11-14 NOTE — Assessment & Plan Note (Signed)
Blood pressure doing well.  Same medication regimen.  Follow pressures.  Follow metabolic panel.   

## 2014-11-14 NOTE — Assessment & Plan Note (Signed)
Diet and exercise.  Follow.  

## 2014-11-14 NOTE — Assessment & Plan Note (Signed)
Discussed taking tylenol scheduled.  Avoid increased antiinflammatories.  Intolerant to gabapentin.  Follow.

## 2014-11-14 NOTE — Assessment & Plan Note (Signed)
Low cholesterol diet and exercise.  Follow lipid panel.   

## 2014-11-14 NOTE — Assessment & Plan Note (Signed)
Saw dermatology.  Diagnosed with psoriasis.  Better.

## 2014-11-16 DIAGNOSIS — I495 Sick sinus syndrome: Secondary | ICD-10-CM | POA: Diagnosis not present

## 2014-11-25 DIAGNOSIS — E782 Mixed hyperlipidemia: Secondary | ICD-10-CM | POA: Diagnosis not present

## 2014-11-25 DIAGNOSIS — I119 Hypertensive heart disease without heart failure: Secondary | ICD-10-CM | POA: Diagnosis not present

## 2014-11-25 DIAGNOSIS — I495 Sick sinus syndrome: Secondary | ICD-10-CM | POA: Diagnosis not present

## 2014-11-25 DIAGNOSIS — I10 Essential (primary) hypertension: Secondary | ICD-10-CM | POA: Diagnosis not present

## 2014-12-07 ENCOUNTER — Other Ambulatory Visit: Payer: Self-pay | Admitting: Internal Medicine

## 2014-12-15 ENCOUNTER — Other Ambulatory Visit: Payer: Self-pay | Admitting: Internal Medicine

## 2015-03-01 ENCOUNTER — Other Ambulatory Visit: Payer: Self-pay | Admitting: Unknown Physician Specialty

## 2015-03-01 DIAGNOSIS — R1084 Generalized abdominal pain: Secondary | ICD-10-CM | POA: Diagnosis not present

## 2015-03-04 ENCOUNTER — Ambulatory Visit
Admission: RE | Admit: 2015-03-04 | Discharge: 2015-03-04 | Disposition: A | Discharge status: Home / Self Care | Payer: Commercial Managed Care - HMO | Source: Ambulatory Visit | Attending: Unknown Physician Specialty | Admitting: Unknown Physician Specialty

## 2015-03-04 DIAGNOSIS — R1084 Generalized abdominal pain: Secondary | ICD-10-CM

## 2015-03-04 DIAGNOSIS — K769 Liver disease, unspecified: Secondary | ICD-10-CM | POA: Insufficient documentation

## 2015-03-04 DIAGNOSIS — I517 Cardiomegaly: Secondary | ICD-10-CM | POA: Diagnosis not present

## 2015-03-04 DIAGNOSIS — N289 Disorder of kidney and ureter, unspecified: Secondary | ICD-10-CM | POA: Diagnosis not present

## 2015-03-04 DIAGNOSIS — J90 Pleural effusion, not elsewhere classified: Secondary | ICD-10-CM | POA: Insufficient documentation

## 2015-03-04 DIAGNOSIS — N2889 Other specified disorders of kidney and ureter: Secondary | ICD-10-CM | POA: Diagnosis not present

## 2015-03-04 MED ORDER — IOHEXOL 300 MG/ML  SOLN
100.0000 mL | Freq: Once | INTRAMUSCULAR | Status: AC | PRN
  Administered 2015-03-04: 80 mL via INTRAVENOUS

## 2015-03-06 ENCOUNTER — Other Ambulatory Visit: Payer: Self-pay | Admitting: Internal Medicine

## 2015-03-16 ENCOUNTER — Other Ambulatory Visit (INDEPENDENT_AMBULATORY_CARE_PROVIDER_SITE_OTHER): Payer: Commercial Managed Care - HMO

## 2015-03-16 DIAGNOSIS — I482 Chronic atrial fibrillation, unspecified: Secondary | ICD-10-CM

## 2015-03-16 DIAGNOSIS — E7800 Pure hypercholesterolemia, unspecified: Secondary | ICD-10-CM

## 2015-03-16 DIAGNOSIS — R739 Hyperglycemia, unspecified: Secondary | ICD-10-CM | POA: Diagnosis not present

## 2015-03-16 DIAGNOSIS — E039 Hypothyroidism, unspecified: Secondary | ICD-10-CM | POA: Diagnosis not present

## 2015-03-16 DIAGNOSIS — I10 Essential (primary) hypertension: Secondary | ICD-10-CM | POA: Diagnosis not present

## 2015-03-16 LAB — BASIC METABOLIC PANEL
BUN: 17 mg/dL (ref 6–23)
CALCIUM: 9.6 mg/dL (ref 8.4–10.5)
CO2: 30 mEq/L (ref 19–32)
Chloride: 99 mEq/L (ref 96–112)
Creatinine, Ser: 1.05 mg/dL (ref 0.40–1.20)
GFR: 52.39 mL/min — ABNORMAL LOW (ref >60.00)
Glucose, Bld: 87 mg/dL (ref 70–99)
POTASSIUM: 4.4 meq/L (ref 3.5–5.1)
Sodium: 136 mEq/L (ref 135–145)

## 2015-03-16 LAB — LIPID PANEL
Cholesterol: 200 mg/dL (ref 0–200)
HDL: 63.4 mg/dL (ref >39.00)
LDL Cholesterol: 119 mg/dL — ABNORMAL HIGH (ref 0–99)
NONHDL: 136.2
Total CHOL/HDL Ratio: 3
Triglycerides: 86 mg/dL (ref 0.0–149.0)
VLDL: 17.2 mg/dL (ref 0.0–40.0)

## 2015-03-16 LAB — CBC WITH DIFFERENTIAL/PLATELET
Basophils Absolute: 0 10*3/uL (ref 0.0–0.1)
Basophils Relative: 0.8 % (ref 0.0–3.0)
EOS PCT: 1.9 % (ref 0.0–5.0)
Eosinophils Absolute: 0.1 10*3/uL (ref 0.0–0.7)
HEMATOCRIT: 39.9 % (ref 36.0–46.0)
HEMOGLOBIN: 13.5 g/dL (ref 12.0–15.0)
LYMPHS PCT: 25.8 % (ref 12.0–46.0)
Lymphs Abs: 1.4 10*3/uL (ref 0.7–4.0)
MCHC: 33.8 g/dL (ref 30.0–36.0)
MCV: 87.4 fl (ref 78.0–100.0)
MONO ABS: 0.5 10*3/uL (ref 0.1–1.0)
Monocytes Relative: 9.4 % (ref 3.0–12.0)
Neutro Abs: 3.4 10*3/uL (ref 1.4–7.7)
Neutrophils Relative %: 62.1 % (ref 43.0–77.0)
Platelets: 231 10*3/uL (ref 150.0–400.0)
RBC: 4.56 Mil/uL (ref 3.87–5.11)
RDW: 13 % (ref 11.5–15.5)
WBC: 5.5 10*3/uL (ref 4.0–10.5)

## 2015-03-16 LAB — HEPATIC FUNCTION PANEL
ALT: 13 U/L (ref 0–35)
AST: 16 U/L (ref 0–37)
Albumin: 4.1 g/dL (ref 3.5–5.2)
Alkaline Phosphatase: 54 U/L (ref 39–117)
Bilirubin, Direct: 0.2 mg/dL (ref 0.0–0.3)
Total Bilirubin: 0.9 mg/dL (ref 0.2–1.2)
Total Protein: 6.9 g/dL (ref 6.0–8.3)

## 2015-03-16 LAB — HEMOGLOBIN A1C: HEMOGLOBIN A1C: 5.6 % (ref 4.6–6.5)

## 2015-03-16 LAB — TSH: TSH: 3.38 u[IU]/mL (ref 0.35–4.50)

## 2015-03-17 ENCOUNTER — Encounter: Payer: Self-pay | Admitting: Internal Medicine

## 2015-03-21 ENCOUNTER — Encounter: Payer: Self-pay | Admitting: Internal Medicine

## 2015-03-21 ENCOUNTER — Ambulatory Visit (INDEPENDENT_AMBULATORY_CARE_PROVIDER_SITE_OTHER): Payer: Commercial Managed Care - HMO | Admitting: Internal Medicine

## 2015-03-21 VITALS — BP 128/80 | HR 85 | Temp 97.6°F | Ht 63.0 in | Wt 175.4 lb

## 2015-03-21 DIAGNOSIS — E7800 Pure hypercholesterolemia, unspecified: Secondary | ICD-10-CM

## 2015-03-21 DIAGNOSIS — Z23 Encounter for immunization: Secondary | ICD-10-CM

## 2015-03-21 DIAGNOSIS — I482 Chronic atrial fibrillation, unspecified: Secondary | ICD-10-CM

## 2015-03-21 DIAGNOSIS — N2889 Other specified disorders of kidney and ureter: Secondary | ICD-10-CM

## 2015-03-21 DIAGNOSIS — E039 Hypothyroidism, unspecified: Secondary | ICD-10-CM

## 2015-03-21 DIAGNOSIS — R739 Hyperglycemia, unspecified: Secondary | ICD-10-CM

## 2015-03-21 DIAGNOSIS — I10 Essential (primary) hypertension: Secondary | ICD-10-CM

## 2015-03-21 DIAGNOSIS — J948 Other specified pleural conditions: Secondary | ICD-10-CM

## 2015-03-21 DIAGNOSIS — J90 Pleural effusion, not elsewhere classified: Secondary | ICD-10-CM

## 2015-03-21 NOTE — Progress Notes (Signed)
Patient ID: Heidi Baldwin, female   DOB: [birth year], 81 y.o.   MRN: [id number]   Subjective:    Patient ID: Heidi Baldwin, female    DOB: 01-Sep-1923, 81 y.o.   MRN: [id number]  HPI  Patient here for a scheduled follow up.  She saw Dr Tiffany Kocher recently.  Had CT scan abdomen and pelvis.  Found kidney lesion that per report appears to be c/w renal cell carcinoma.  She has been referred to urology and has an appt next week with Dr Elnoria Howard.  Also noted on scan - pleural effusion (small left pleural effusion).  We discussed this as well.  Discussed my desire to obtain chest CT.  She declines at this time.  Wants to see urology first and then will let me know if desires to pursue CT.  Abdominal pain is better.  Eating well.  Bowels are better.  Knee discomfort limits her.     Past Medical History  Diagnosis Date  . Arthritis   . Allergy   . Heart murmur   . Hypothyroidism   . Hypertension   . Hypercholesterolemia   . Atrial fibrillation   . Hx of actinic keratosis May 2015    under left breast   Past Surgical History  Procedure Laterality Date  . Cholecystectomy    . Abdominal hysterectomy    . Pacemaker insertion  05/03/13   Family History  Problem Relation Age of Onset  . Diabetes Mother   . Heart disease Father   . Hyperlipidemia Father   . Heart disease Sister   . Arthritis Son    Social History   Social History  . Marital Status: Widowed    Spouse Name: N/A  . Number of Children: N/A  . Years of Education: N/A   Social History Main Topics  . Smoking status: Never Smoker   . Smokeless tobacco: Never Used  . Alcohol Use: No  . Drug Use: No  . Sexual Activity: Not Asked   Other Topics Concern  . None   Social History Narrative     Review of Systems  Constitutional: Negative for appetite change and unexpected weight change.  HENT: Negative for congestion and sinus pressure.   Respiratory: Negative for cough, chest tightness and shortness of breath.   Cardiovascular:  Negative for chest pain and palpitations.  Gastrointestinal: Negative for nausea, vomiting and diarrhea.       Abdominal pain better.    Genitourinary: Negative for dysuria and difficulty urinating.  Musculoskeletal:       Leg pain limits her activity at times.  She tries to stay active.    Skin: Negative for color change and rash.  Neurological: Negative for dizziness, light-headedness and headaches.  Psychiatric/Behavioral: Negative for dysphoric mood and agitation.       Objective:     Blood pressure rechecked by me:  128/70  Physical Exam  Constitutional: She appears well-developed and well-nourished. No distress.  HENT:  Nose: Nose normal.  Mouth/Throat: Oropharynx is clear and moist.  Neck: Neck supple. No thyromegaly present.  Cardiovascular: Normal rate and regular rhythm.   1/VI systolic murmur.   Pulmonary/Chest: Breath sounds normal. No respiratory distress. She has no wheezes.  Abdominal: Soft. Bowel sounds are normal. There is no tenderness.  Musculoskeletal: She exhibits no edema or tenderness.  Lymphadenopathy:    She has no cervical adenopathy.  Skin: No rash noted. No erythema.  Psychiatric: She has a normal mood and affect. Her behavior is normal.  BP 128/80 mmHg  Pulse 85  Temp(Src) 97.6 F (36.4 C) (Oral)  Ht 5' 3"  (1.6 m)  Wt 175 lb 6 oz (79.55 kg)  BMI 31.07 kg/m2  SpO2 97% Wt Readings from Last 3 Encounters:  03/21/15 175 lb 6 oz (79.55 kg)  11/08/14 176 lb 4 oz (79.946 kg)  07/09/14 173 lb 4 oz (78.586 kg)     Lab Results  Component Value Date   WBC 5.5 03/16/2015   HGB 13.5 03/16/2015   HCT 39.9 03/16/2015   PLT 231.0 03/16/2015   GLUCOSE 87 03/16/2015   CHOL 200 03/16/2015   TRIG 86.0 03/16/2015   HDL 63.40 03/16/2015   LDLCALC 119* 03/16/2015   ALT 13 03/16/2015   AST 16 03/16/2015   NA 136 03/16/2015   K 4.4 03/16/2015   CL 99 03/16/2015   CREATININE 1.05 03/16/2015   BUN 17 03/16/2015   CO2 30 03/16/2015   TSH 3.38  03/16/2015   INR 1.1 03/31/2013   HGBA1C 5.6 03/16/2015    Ct Abdomen Pelvis W Contrast  03/04/2015   CLINICAL DATA:  81 year old female with history of left lower quadrant abdominal pain and left mid abdominal pain for the past 6-7 months. No associated nausea, vomiting or diarrhea.  EXAM: CT ABDOMEN AND PELVIS WITH CONTRAST  TECHNIQUE: Multidetector CT imaging of the abdomen and pelvis was performed using the standard protocol following bolus administration of intravenous contrast.  CONTRAST:  6m OMNIPAQUE IOHEXOL 300 MG/ML  SOLN  COMPARISON:  CT the abdomen and pelvis 08/30/2010.  FINDINGS: Lower chest: Small left pleural effusion. Cardiomegaly. Calcifications of the mitral annulus. Dilatation of right atrium and right ventricle. Pacemaker lead terminating in the right ventricular apex. Small hiatal hernia.  Hepatobiliary: Sub cm low-attenuation lesions are noted throughout the hepatic parenchyma, too small to definitively characterize, but favored to represent tiny cysts. In addition, there is a small area of hyperenhancement in segment 7 of the liver measuring approximately 1.3 cm in diameter (image 18 of series 2), which is very similar in retrospect to prior study from 07/30/2008, presumably a benign lesion such as a small cavernous hemangioma. No other larger more suspicious appearing hepatic lesions are noted. No intra or extrahepatic biliary ductal dilatation. Status post cholecystectomy.  Pancreas: No pancreatic mass. No pancreatic ductal dilatation. No pancreatic or peripancreatic fluid or inflammatory changes.  Spleen: Unremarkable.  Adrenals/Urinary Tract: 4.9 x 4.9 x 5.1 cm hypervascular mass in the posterior aspect of the upper pole of the right kidney. This mass appears to be well-circumscribed, likely encapsulated within Gerota's fascia. The mass does come in close contact with the undersurface of the liver, particularly in the region of segment 7, where there is obliteration of the  intervening fat plane, however, there is no definitive evidence of direct invasion of the liver at this time. The mass is well separated from the right renal vein which is widely patent. Multiple sub cm low-attenuation lesions are noted in the kidneys bilaterally, which are too small to definitively characterize, but favored to represent tiny cysts. In addition, there is a 1.6 cm low-attenuation lesion in the lower pole of the left kidney, compatible with a simple cyst. No hydroureteronephrosis. Urinary bladder is normal in appearance. Bilateral adrenal glands are normal in appearance.  Stomach/Bowel: Normal appearance of the stomach. No pathologic dilatation of small bowel or colon. Numerous colonic diverticulae are noted, most severe in the region of the sigmoid colon, without surrounding inflammatory changes to suggest an acute diverticulitis at  this time.  Vascular/Lymphatic: Single renal arteries bilaterally. Circumaortic left renal vein (normal anatomical variant) incidentally noted. Extensive atherosclerosis throughout the abdominal and pelvic vasculature, without evidence of aneurysm or dissection. No lymphadenopathy noted in the abdomen or pelvis. Specifically, no right perinephric or retroperitoneal lymphadenopathy identified.  Reproductive: Status post hysterectomy. Ovaries are unremarkable in appearance.  Other: No significant volume of ascites.  No pneumoperitoneum.  Musculoskeletal: There are no aggressive appearing lytic or blastic lesions noted in the visualized portions of the skeleton.  IMPRESSION: 1. 4.9 x 4.9 x 5.1 cm avidly enhancing mass in the upper pole of the right kidney, highly concerning for renal cell carcinoma. This does come in close contact with the posterior aspect of segment 7 of the liver, but is favored to be completely encapsulated within Gerota's fascia at this time, and does not involve the right renal vein. No lymphadenopathy and no definite signs of metastatic disease in the  abdomen or pelvis. Based on these findings, this appears represent T1b, N0, Mx disease. Urologic consultation is strongly recommended. 2. Small left pleural effusion. This is of uncertain etiology and significance. Further evaluation with contrast enhanced chest CT is recommended at this time to exclude the possibility of pulmonary or pleural metastasis. 3. Small hypervascular lesion in segment 7 of the liver is unchanged compared to remote prior examination from 07/30/2008, presumably a benign lesion such as a small cavernous hemangioma. 4. Cardiomegaly with dilatation of the right atrium and right ventricle. These results will be called to the ordering clinician or representative by the Radiologist Assistant, and communication documented in the PACS or zVision Dashboard.   Electronically Signed   By: Vinnie Langton M.D.   On: 03/04/2015 10:23       Assessment & Plan:   Problem List Items Addressed This Visit    Atrial fibrillation    Followed by cardiology.  Has pacemaker in place.  On eliquis.  Stable.        Essential hypertension, benign    Blood pressure doing well.  Same medication regimen.  Follow pressures.  Follow metabolic panel.        Hypercholesterolemia    Low cholesterol diet and exercise.  Follow lipid panel.    Lab Results  Component Value Date   CHOL 200 03/16/2015   HDL 63.40 03/16/2015   LDLCALC 119* 03/16/2015   TRIG 86.0 03/16/2015   CHOLHDL 3 03/16/2015        Hyperglycemia    Low carb diet and exercise.  Follow met b and a1c.   Lab Results  Component Value Date   HGBA1C 5.6 03/16/2015        Hypothyroidism    On thyroid replacement.  Follow tsh.        Kidney mass    Found on CT scan.  Due to follow up with urology next week.  Further w/up pending their assessment.        Pleural effusion, left    Discussed with her regarding further w/up - including chest CT.  She declines.  Wants to see what urology recommends.  Will notify me if she changes  her mind.         Other Visit Diagnoses    Encounter for immunization    -  Primary        Einar Pheasant, MD

## 2015-03-21 NOTE — Progress Notes (Signed)
Pre-visit discussion using our clinic review tool. No additional management support is needed unless otherwise documented below in the visit note.  

## 2015-03-27 ENCOUNTER — Encounter: Payer: Self-pay | Admitting: Internal Medicine

## 2015-03-27 DIAGNOSIS — N2889 Other specified disorders of kidney and ureter: Secondary | ICD-10-CM | POA: Insufficient documentation

## 2015-03-27 DIAGNOSIS — J90 Pleural effusion, not elsewhere classified: Secondary | ICD-10-CM | POA: Insufficient documentation

## 2015-03-27 NOTE — Assessment & Plan Note (Signed)
Blood pressure doing well.  Same medication regimen.  Follow pressures.  Follow metabolic panel.   

## 2015-03-27 NOTE — Assessment & Plan Note (Addendum)
Low cholesterol diet and exercise.  Follow lipid panel.    Lab Results  Component Value Date   CHOL 200 03/16/2015   HDL 63.40 03/16/2015   LDLCALC 119* 03/16/2015   TRIG 86.0 03/16/2015   CHOLHDL 3 03/16/2015

## 2015-03-27 NOTE — Assessment & Plan Note (Signed)
Found on CT scan.  Due to follow up with urology next week.  Further w/up pending their assessment.

## 2015-03-27 NOTE — Assessment & Plan Note (Signed)
On thyroid replacement.  Follow tsh.  

## 2015-03-27 NOTE — Assessment & Plan Note (Addendum)
Low carb diet and exercise.  Follow met b and a1c.   Lab Results  Component Value Date   HGBA1C 5.6 03/16/2015

## 2015-03-27 NOTE — Assessment & Plan Note (Signed)
Followed by cardiology.  Has pacemaker in place.  On eliquis.  Stable.

## 2015-03-27 NOTE — Assessment & Plan Note (Signed)
Discussed with her regarding further w/up - including chest CT.  She declines.  Wants to see what urology recommends.  Will notify me if she changes her mind.

## 2015-04-01 ENCOUNTER — Ambulatory Visit (INDEPENDENT_AMBULATORY_CARE_PROVIDER_SITE_OTHER): Payer: Commercial Managed Care - HMO | Admitting: Urology

## 2015-04-01 ENCOUNTER — Encounter: Payer: Self-pay | Admitting: Urology

## 2015-04-01 ENCOUNTER — Encounter: Payer: Self-pay | Admitting: *Deleted

## 2015-04-01 VITALS — BP 157/75 | HR 80 | Ht 64.0 in | Wt 176.4 lb

## 2015-04-01 DIAGNOSIS — N2889 Other specified disorders of kidney and ureter: Secondary | ICD-10-CM | POA: Diagnosis not present

## 2015-04-01 DIAGNOSIS — H547 Unspecified visual loss: Secondary | ICD-10-CM | POA: Insufficient documentation

## 2015-04-01 DIAGNOSIS — K219 Gastro-esophageal reflux disease without esophagitis: Secondary | ICD-10-CM | POA: Insufficient documentation

## 2015-04-01 NOTE — Progress Notes (Signed)
04/01/2015 2:31 PM   Heidi Baldwin July 21, 1924 [id number]  Referring provider: Einar Pheasant, MD [address] [place], [place] [address]  Chief Complaint  Patient presents with  . Abnormal CT scan    referred by Dr. Vira Agar    HPI: Heidi Baldwin is a 81yo seen in consultation for right renal mass. She is referred by Dr. Nicki Reaper. She had an [place] 7 years ago and a mass was found on [place] but was told by her urologist not to worry about mass. She had a CT scan on 03/04/2015 and she was found to have a 5cm right mid pole renal mass. She denies hematuria. She denies LUTS minus nocturia. She has lower extremity edema.  She has hx of cholecystectomy   PMH: Past Medical History  Diagnosis Date  . Arthritis   . Allergy   . Heart murmur   . Hypothyroidism   . Hypertension   . Hypercholesterolemia   . Atrial fibrillation   . Hx of actinic keratosis May 2015    under left breast  . Acid reflux   . Heart disease     Surgical History: Past Surgical History  Procedure Laterality Date  . Cholecystectomy    . Abdominal hysterectomy    . Pacemaker insertion  05/03/13  . Corneal transplant      Right x 3 Left x1  . Knee surgery Left   . Cyst removal trunk      Tail bone    Home Medications:    Medication List       This list is accurate as of: 04/01/15  2:31 PM.  Always use your most recent med list.               amLODipine 5 MG tablet  Commonly known as:  NORVASC  TAKE 1 TABLET (5 MG TOTAL) BY MOUTH DAILY.     ELIQUIS 5 MG Tabs tablet  Generic drug:  apixaban  Take 5 mg by mouth 2 (two) times daily.     levothyroxine 50 MCG tablet  Commonly known as:  SYNTHROID, LEVOTHROID  TAKE 1 TABLET BY MOUTH DAILY ON EMPTY STOMACH WITH GLASS OF WATER 30-60MINS BEFORE BREAKFAST     losartan 100 MG tablet  Commonly known as:  COZAAR  TAKE 1 TABLET BY MOUTH EVERY DAY     pantoprazole 40 MG tablet  Commonly known as:  PROTONIX  TAKE 1 TABLET BY MOUTH TWICE A DAY     spironolactone 25 MG tablet  Commonly known as:  ALDACTONE  TAKE 1/2 TABLET BY MOUTH EVERY DAY        Allergies:  Allergies  Allergen Reactions  . Codeine Other (See Comments)  . Meloxicam Other (See Comments)  . Metronidazole Other (See Comments)  . Nsaids Other (See Comments)  . Penicillins Other (See Comments)  . Statins Other (See Comments)  . Sulfa Antibiotics   . Tramadol Other (See Comments)    Family History: Family History  Problem Relation Age of Onset  . Diabetes Mother   . Heart disease Father   . Hyperlipidemia Father   . Heart disease Sister   . Arthritis Son     Social History:  reports that she has never smoked. She has never used smokeless tobacco. She reports that she does not drink alcohol or use illicit drugs.  ROS: UROLOGY Frequent Urination?: Yes Hard to postpone urination?: Yes Burning/pain with urination?: No Get up at night to urinate?: Yes Leakage of urine?:  Yes Urine stream starts and stops?: No Trouble starting stream?: No Do you have to strain to urinate?: No Blood in urine?: No Urinary tract infection?: No Sexually transmitted disease?: No Injury to kidneys or bladder?: No Painful intercourse?: No Weak stream?: No Currently pregnant?: No Vaginal bleeding?: No Last menstrual period?: n  Gastrointestinal Nausea?: No Vomiting?: No Indigestion/heartburn?: Yes Diarrhea?: No Constipation?: Yes  Constitutional Fever: No Night sweats?: No Weight loss?: No Fatigue?: Yes  Skin Skin rash/lesions?: No Itching?: No  Eyes Blurred vision?: No Double vision?: No  Ears/Nose/Throat Sore throat?: No Sinus problems?: No  Hematologic/Lymphatic Swollen glands?: No Easy bruising?: No  Cardiovascular Leg swelling?: Yes Chest pain?: No  Respiratory Cough?: No Shortness of breath?: No  Endocrine Excessive thirst?: No  Musculoskeletal Back pain?: Yes Joint pain?: Yes  Neurological Headaches?: No Dizziness?:  No  Psychologic Depression?: No Anxiety?: No  Physical Exam: BP 157/75 mmHg  Pulse 80  Ht 5\' 4"  (1.626 m)  Wt 80.015 kg (176 lb 6.4 oz)  BMI 30.26 kg/m2  Constitutional:  Alert and oriented, No acute distress. HEENT: [place] AT, moist mucus membranes.  Trachea midline, no masses. Cardiovascular: No clubbing, cyanosis, or edema. Respiratory: Normal respiratory effort, no increased work of breathing. GI: Abdomen is soft, nontender, nondistended, no abdominal masses GU: No CVA tenderness. Skin: No rashes, bruises or suspicious lesions. Lymph: No cervical or inguinal adenopathy. Neurologic: Grossly intact, no focal deficits, moving all 4 extremities. Psychiatric: Normal mood and affect.  Laboratory Data: Lab Results  Component Value Date   WBC 5.5 03/16/2015   HGB 13.5 03/16/2015   HCT 39.9 03/16/2015   MCV 87.4 03/16/2015   PLT 231.0 03/16/2015    Lab Results  Component Value Date   CREATININE 1.05 03/16/2015    No results found for: PSA  No results found for: TESTOSTERONE  Lab Results  Component Value Date   HGBA1C 5.6 03/16/2015    Urinalysis No results found for: COLORURINE, APPEARANCEUR, LABSPEC, PHURINE, GLUCOSEU, HGBUR, BILIRUBINUR, KETONESUR, PROTEINUR, UROBILINOGEN, NITRITE, LEUKOCYTESUR  Pertinent Imaging: CT w/wo contrast  Assessment & Plan:   1. Right renal mass -IR biopsy of right renal mass -pt will need to stop eliquis prior to biopsy and we will need clearance from cardiology -RTC after biopsy  We discussed the differential diagnosis of renal masses including benign etiologies  (oncocytoma, cysts, angiomyolipoma, etc..) with may compromise as much as 20% of  lesions as well as malignant causes, most commonly renal cell carcinoma and its variants  which compromise approximately 80% of lesions. We then discussed management  options of imaging-based active surveillance (usually reserved for masses &lt;3cm or  bosniak 33F or less cysts, or  patients which significant comorbidity), ablation (cryo,  radiofrequency ablation), or surgical extirpation with and without nephron-sparing  techniques with and without minimally-invasive assistance. We outlined risks / benefits  of each of these strategies as well as their respective possibility of oncologic control with  respect to the patient's overall health status and life-expectancy. We briefly discussed the  role of pre-treatment biopsy, which is controversial as it is may have significant false-  negative rate and may promote tumor spillage and is typically reserved at this point for  special circumstances.  After lengthy and careful discussion, including answering all questions to the patient's  satisfaction, the patient has elected to proceed with biopsy of renal mass.  There are no diagnoses linked to this encounter.  No Follow-up on file.  Heidi Baldwin, Candee Furbish, [facility]  [address] [place], [place] [address] [phone]

## 2015-04-11 ENCOUNTER — Other Ambulatory Visit: Payer: Self-pay | Admitting: Urology

## 2015-04-11 NOTE — Addendum Note (Signed)
Addended by: Cleon Gustin on: 04/11/2015 02:41 PM   Modules accepted: Orders

## 2015-04-12 ENCOUNTER — Telehealth: Payer: Self-pay | Admitting: Urology

## 2015-04-12 NOTE — Telephone Encounter (Signed)
I called and spoke of with Ebony Hail and she informed me that Dr. Melynda Keller had some hesitation with the pt Age/health for CT Renal Biopsy. I informed her that I spoke w/ Dr. Nyoka Cowden the Radiologist on yesterday and he just requested clarity on the test he was ordering. I text Dr.Mckenzie the radiologist phonel number Dr.Green.   I spoke back with Ebony Hail appt scheduled for Tuesday, Sept 27th @ 11:00am, but she needs to arrive 1 hr earlier.

## 2015-04-12 NOTE — Telephone Encounter (Signed)
Pt instructed to discontinue Eliquis 5 days prior to the CT biopsy.

## 2015-04-12 NOTE — Telephone Encounter (Signed)
Pt instructed to be NPO after midnight the night before the procedure and to make sure she have a driver the day of. She needs to arrive to [place] on Tuesday, Sept 27th at 10:00am. She verbally stated understanding, no questions or concerns.

## 2015-04-19 ENCOUNTER — Ambulatory Visit
Admission: RE | Admit: 2015-04-19 | Discharge: 2015-04-19 | Disposition: A | Discharge status: Home / Self Care | Payer: Commercial Managed Care - HMO | Source: Ambulatory Visit | Attending: Urology | Admitting: Urology

## 2015-04-19 DIAGNOSIS — N2889 Other specified disorders of kidney and ureter: Secondary | ICD-10-CM

## 2015-04-19 DIAGNOSIS — C641 Malignant neoplasm of right kidney, except renal pelvis: Secondary | ICD-10-CM | POA: Diagnosis not present

## 2015-04-19 LAB — PROTIME-INR
INR: 1.04
PROTHROMBIN TIME: 13.8 s (ref 11.4–15.0)

## 2015-04-19 LAB — APTT: aPTT: 29 seconds (ref 24–36)

## 2015-04-19 MED ORDER — SODIUM CHLORIDE 0.9 % IV SOLN
INTRAVENOUS | Status: DC
  Administered 2015-04-19: 09:00:00 via INTRAVENOUS

## 2015-04-19 MED ORDER — FENTANYL CITRATE (PF) 100 MCG/2ML IJ SOLN
INTRAMUSCULAR | Status: AC
  Filled 2015-04-19: qty 2

## 2015-04-19 MED ORDER — MIDAZOLAM HCL 2 MG/2ML IJ SOLN
INTRAMUSCULAR | Status: AC
  Filled 2015-04-19: qty 2

## 2015-04-19 MED ORDER — HYDROCODONE-ACETAMINOPHEN 5-325 MG PO TABS
1.0000 | ORAL_TABLET | ORAL | Status: DC | PRN
  Filled 2015-04-19: qty 2

## 2015-04-19 NOTE — OR Nursing (Signed)
Dr Golden Circle told pt discharge two hours post biopsy if stable. Son informed. Will be back to pick up pt, after call

## 2015-04-19 NOTE — Discharge Instructions (Signed)
Liver Biopsy, Care After °Refer to this sheet in the next few weeks. These instructions provide you with information on caring for yourself after your procedure. Your health care provider may also give you more specific instructions. Your treatment has been planned according to current medical practices, but problems sometimes occur. Call your health care provider if you have any problems or questions after your procedure. °WHAT TO EXPECT AFTER THE PROCEDURE °After your procedure, it is typical to have the following: °· A small amount of discomfort in the area where the biopsy was done and in the right shoulder or shoulder blade. °· A small amount of bruising around the area where the biopsy was done and on the skin over the liver. °· Sleepiness and fatigue for the rest of the day. °HOME CARE INSTRUCTIONS  °· Rest at home for 1-2 days or as directed by your health care provider. °· Have a friend or family member stay with you for at least 24 hours. °· Because of the medicines used during the procedure, you should not do the following things in the first 24 hours: °¨ Drive. °¨ Use machinery. °¨ Be responsible for the care of other people. °¨ Sign legal documents. °¨ Take a bath or shower. °· There are many different ways to close and cover an incision, including stitches, skin glue, and adhesive strips. Follow your health care provider's instructions on: °¨ Incision care. °¨ Bandage (dressing) changes and removal. °¨ Incision closure removal. °· Do not drink alcohol in the first week. °· Do not lift more than 5 pounds or play contact sports for 2 weeks after this test. °· Take medicines only as directed by your health care provider. Do not take medicine containing aspirin or non-steroidal anti-inflammatory medicines such as ibuprofen for 1 week after this test. °· It is your responsibility to get your test results. °SEEK MEDICAL CARE IF:  °· You have increased bleeding from an incision that results in more than a  small spot of blood. °· You have redness, swelling, or increasing pain in any incisions. °· You notice a discharge or a bad smell coming from any of your incisions. °· You have a fever or chills. °SEEK IMMEDIATE MEDICAL CARE IF:  °· You develop swelling, bloating, or pain in your abdomen. °· You become dizzy or faint. °· You develop a rash. °· You are nauseous or vomit. °· You have difficulty breathing, feel short of breath, or feel faint. °· You develop chest pain. °· You have problems with your speech or vision. °· You have trouble balancing or moving your arms or legs. °Document Released: 01/26/2005 Document Revised: 11/23/2013 Document Reviewed: 09/04/2013 °Exit[employer]® Patient Information ©2015 [employer]. This information is not intended to replace advice given to you by your health care provider. Make sure you discuss any questions you have with your health care provider. ° °

## 2015-04-19 NOTE — Procedures (Signed)
CT Right Renal biopsy   Complications:  None  Blood Loss: none  See dictation in canopy pacs

## 2015-04-20 ENCOUNTER — Telehealth: Payer: Self-pay | Admitting: *Deleted

## 2015-04-20 LAB — SURGICAL PATHOLOGY

## 2015-04-20 NOTE — Telephone Encounter (Signed)
Noted.  Followed by urology and ordered by urology.  Keep [place] posted.

## 2015-04-20 NOTE — Telephone Encounter (Signed)
Bx report of right renal mass: Impression: Clear cell renal cell carcinoma-grade 2  (Per  Dr. Luana Shu)

## 2015-04-25 ENCOUNTER — Encounter: Payer: Self-pay | Admitting: Urology

## 2015-04-25 ENCOUNTER — Ambulatory Visit (INDEPENDENT_AMBULATORY_CARE_PROVIDER_SITE_OTHER): Payer: Commercial Managed Care - HMO | Admitting: Urology

## 2015-04-25 VITALS — BP 165/80 | HR 86 | Ht 64.0 in | Wt 177.1 lb

## 2015-04-25 DIAGNOSIS — C649 Malignant neoplasm of unspecified kidney, except renal pelvis: Secondary | ICD-10-CM | POA: Insufficient documentation

## 2015-04-25 DIAGNOSIS — N2889 Other specified disorders of kidney and ureter: Secondary | ICD-10-CM

## 2015-04-25 DIAGNOSIS — C641 Malignant neoplasm of right kidney, except renal pelvis: Secondary | ICD-10-CM

## 2015-04-25 LAB — URINALYSIS, COMPLETE
BILIRUBIN UA: NEGATIVE
Glucose, UA: NEGATIVE
Ketones, UA: NEGATIVE
Nitrite, UA: NEGATIVE
PROTEIN UA: NEGATIVE
Specific Gravity, UA: 1.015 (ref 1.005–1.030)
Urobilinogen, Ur: 1 mg/dL (ref 0.2–1.0)
pH, UA: 6.5 (ref 5.0–7.5)

## 2015-04-25 LAB — MICROSCOPIC EXAMINATION

## 2015-04-25 NOTE — Progress Notes (Signed)
04/25/2015 1:42 PM   Heidi Baldwin 03/31/1924 [id number]  Referring provider: Einar Pheasant, MD [address] [place], [place] [address]  Chief Complaint  Patient presents with  . Results    HPI: Heidi Baldwin is a 81yo with multiple medical problems who presents today for followup for a right renal mass. She underwent CT guided biopsy which revealed RCC [place] grade 2. She denies any pain biopsy. No hematuria. She has a pacemaker and is on eliquis. She gets short of breathe with walking up a flight of stairs.   PMH: Past Medical History  Diagnosis Date  . Arthritis   . Allergy   . Heart murmur   . Hypothyroidism   . Hypertension   . Hypercholesterolemia   . Atrial fibrillation   . Hx of actinic keratosis May 2015    under left breast  . Acid reflux   . Heart disease     Surgical History: Past Surgical History  Procedure Laterality Date  . Cholecystectomy    . Abdominal hysterectomy    . Pacemaker insertion  05/03/13  . Corneal transplant      Right x 3 Left x1  . Knee surgery Left   . Cyst removal trunk      Tail bone    Home Medications:    Medication List       This list is accurate as of: 04/25/15  1:42 PM.  Always use your most recent med list.               amLODipine 5 MG tablet  Commonly known as:  NORVASC  TAKE 1 TABLET (5 MG TOTAL) BY MOUTH DAILY.     ELIQUIS 5 MG Tabs tablet  Generic drug:  apixaban  Take 5 mg by mouth 2 (two) times daily.     levothyroxine 50 MCG tablet  Commonly known as:  SYNTHROID, LEVOTHROID  TAKE 1 TABLET BY MOUTH DAILY ON EMPTY STOMACH WITH GLASS OF WATER 30-60MINS BEFORE BREAKFAST     losartan 100 MG tablet  Commonly known as:  COZAAR  TAKE 1 TABLET BY MOUTH EVERY DAY     pantoprazole 40 MG tablet  Commonly known as:  PROTONIX  TAKE 1 TABLET BY MOUTH TWICE A DAY     spironolactone 25 MG tablet  Commonly known as:  ALDACTONE  TAKE 1/2 TABLET BY MOUTH EVERY DAY        Allergies:    Allergies  Allergen Reactions  . Statins Other (See Comments)    weakness  . Sulfa Antibiotics     Caused increased intraocular pressure    Family History: Family History  Problem Relation Age of Onset  . Diabetes Mother   . Heart disease Father   . Hyperlipidemia Father   . Heart disease Sister   . Arthritis Son     Social History:  reports that she has never smoked. She has never used smokeless tobacco. She reports that she does not drink alcohol or use illicit drugs.  ROS: UROLOGY Frequent Urination?: No Hard to postpone urination?: No Burning/pain with urination?: No Get up at night to urinate?: Yes Leakage of urine?: No Urine stream starts and stops?: No Trouble starting stream?: No Do you have to strain to urinate?: No Blood in urine?: No Urinary tract infection?: No Sexually transmitted disease?: No Injury to kidneys or bladder?: No Painful intercourse?: No Weak stream?: No Currently pregnant?: No Vaginal bleeding?: No Last menstrual period?: n  Gastrointestinal Nausea?: No  Vomiting?: No Indigestion/heartburn?: Yes Diarrhea?: No Constipation?: No  Constitutional Fever: No Night sweats?: No Weight loss?: No Fatigue?: Yes  Skin Skin rash/lesions?: No Itching?: No  Eyes Blurred vision?: No Double vision?: No  Ears/Nose/Throat Sore throat?: No Sinus problems?: No  Hematologic/Lymphatic Swollen glands?: No Easy bruising?: No  Cardiovascular Leg swelling?: Yes Chest pain?: No  Respiratory Cough?: No Shortness of breath?: No  Endocrine Excessive thirst?: No  Musculoskeletal Back pain?: No Joint pain?: Yes  Neurological Headaches?: No Dizziness?: No  Psychologic Depression?: No Anxiety?: No  Physical Exam: BP 165/80 mmHg  Pulse 86  Ht 5\' 4"  (1.626 m)  Wt 80.332 kg (177 lb 1.6 oz)  BMI 30.38 kg/m2  Constitutional:  Alert and oriented, No acute distress. HEENT: [place] AT, moist mucus membranes.  Trachea midline, no  masses. Cardiovascular: No clubbing, cyanosis, or edema. Respiratory: Normal respiratory effort, no increased work of breathing. GI: Abdomen is soft, nontender, nondistended, no abdominal masses GU: No CVA tenderness. Skin: No rashes, bruises or suspicious lesions. Lymph: No cervical or inguinal adenopathy. Neurologic: Grossly intact, no focal deficits, moving all 4 extremities. Psychiatric: Normal mood and affect.  Laboratory Data: Lab Results  Component Value Date   WBC 5.5 03/16/2015   HGB 13.5 03/16/2015   HCT 39.9 03/16/2015   MCV 87.4 03/16/2015   PLT 231.0 03/16/2015    Lab Results  Component Value Date   CREATININE 1.05 03/16/2015    No results found for: PSA  No results found for: TESTOSTERONE  Lab Results  Component Value Date   HGBA1C 5.6 03/16/2015    Urinalysis No results found for: COLORURINE, APPEARANCEUR, LABSPEC, [place], GLUCOSEU, HGBUR, BILIRUBINUR, KETONESUR, PROTEINUR, UROBILINOGEN, NITRITE, LEUKOCYTESUR  Pertinent Imaging: CT renal biopsy  Assessment & Plan:    1. Kidney mass - referral to IR for consideration of ablation - we discussed the risks of surgery for radical nephrectomy and I do not think this patient is a surgical candidate for radical nephrectomy - Urinalysis, Complete   No Follow-up on file.  Heidi Baldwin, [facility] [address], [place], [place] [address] [phone]

## 2015-05-02 ENCOUNTER — Other Ambulatory Visit: Payer: Self-pay | Admitting: Urology

## 2015-05-02 DIAGNOSIS — C641 Malignant neoplasm of right kidney, except renal pelvis: Secondary | ICD-10-CM

## 2015-05-03 ENCOUNTER — Other Ambulatory Visit: Payer: Self-pay | Admitting: Internal Medicine

## 2015-05-05 DIAGNOSIS — I495 Sick sinus syndrome: Secondary | ICD-10-CM | POA: Diagnosis not present

## 2015-05-05 DIAGNOSIS — I2510 Atherosclerotic heart disease of native coronary artery without angina pectoris: Secondary | ICD-10-CM | POA: Diagnosis not present

## 2015-05-05 DIAGNOSIS — I340 Nonrheumatic mitral (valve) insufficiency: Secondary | ICD-10-CM | POA: Diagnosis not present

## 2015-05-05 DIAGNOSIS — I10 Essential (primary) hypertension: Secondary | ICD-10-CM | POA: Diagnosis not present

## 2015-05-05 DIAGNOSIS — I272 Other secondary pulmonary hypertension: Secondary | ICD-10-CM | POA: Diagnosis not present

## 2015-05-11 ENCOUNTER — Ambulatory Visit
Admission: RE | Admit: 2015-05-11 | Discharge: 2015-05-11 | Disposition: A | Discharge status: Home / Self Care | Payer: Commercial Managed Care - HMO | Source: Ambulatory Visit | Attending: Urology | Admitting: Urology

## 2015-05-11 DIAGNOSIS — C641 Malignant neoplasm of right kidney, except renal pelvis: Secondary | ICD-10-CM | POA: Diagnosis not present

## 2015-05-11 DIAGNOSIS — C649 Malignant neoplasm of unspecified kidney, except renal pelvis: Secondary | ICD-10-CM | POA: Insufficient documentation

## 2015-05-11 NOTE — Consult Note (Signed)
Chief Complaint: Patient was seen in consultation today for  Chief Complaint  Patient presents with  . Advice Only    Consult for Cryoablation of Right Renal Clear Cell Carcinoma   at the request of Carey L  Referring Physician(s): McKenzie,Patrick L  History of Present Illness: Heidi Baldwin is an 81 y.o. female with an incidentally detected 5 cm biopsy-proven clear cell renal carcinoma arising from the posterior aspect of the mid right kidney. She presents today at the kind request of Dr. Nicolette Bang to discuss the possibility of renal cryoablation. She has not a good candidate for radical nephrectomy given her age and prior cardiac history.  Heidi Baldwin is currently clinically asymptomatic. She denies hematuria or other urinary symptoms.  She is interested in pursuing treatment but cautious regarding how long she would need to stay off her anticoagulation. She has a history of chronic atrial fibrillation with a pacemaker in place and is on Eliquis for stroke prevention. She was recently off her Eliquis for 5 days following her renal biopsy and suffered no symptoms of transient ischemic attack or stroke.  She denies current chest pain, shortness of breath at rest (she does become short of breath if she walks up a flight of stairs), fever, chills or other systemic symptoms.  Past Medical History  Diagnosis Date  . Arthritis   . Allergy   . Heart murmur   . Hypothyroidism   . Hypertension   . Hypercholesterolemia   . Atrial fibrillation ([place])   . Hx of actinic keratosis May 2015    under left breast  . Acid reflux   . Heart disease     Past Surgical History  Procedure Laterality Date  . Cholecystectomy    . Abdominal hysterectomy    . Pacemaker insertion  05/03/13  . Corneal transplant      Right x 3 Left x1  . Knee surgery Left   . Cyst removal trunk      Tail bone    Allergies: Statins and Sulfa antibiotics  Medications: Prior to Admission  medications   Medication Sig Start Date End Date Taking? Authorizing Provider  amLODipine (NORVASC) 5 MG tablet TAKE 1 TABLET (5 MG TOTAL) BY MOUTH DAILY. 12/15/14  Yes Einar Pheasant, MD  apixaban (ELIQUIS) 5 MG TABS tablet Take 5 mg by mouth 2 (two) times daily.    Yes Historical Provider, MD  levothyroxine (SYNTHROID, LEVOTHROID) 50 MCG tablet TAKE 1 TABLET BY MOUTH DAILY ON EMPTY STOMACH WITH GLASS OF WATER 30-60MINS BEFORE BREAKFAST 05/03/15  Yes Einar Pheasant, MD  losartan (COZAAR) 100 MG tablet TAKE 1 TABLET BY MOUTH EVERY DAY 12/15/14  Yes Einar Pheasant, MD  pantoprazole (PROTONIX) 40 MG tablet TAKE 1 TABLET BY MOUTH TWICE A DAY 05/03/15  Yes Einar Pheasant, MD  spironolactone (ALDACTONE) 25 MG tablet TAKE 1/2 TABLET BY MOUTH EVERY DAY 12/07/14  Yes Einar Pheasant, MD     Family History  Problem Relation Age of Onset  . Diabetes Mother   . Heart disease Father   . Hyperlipidemia Father   . Heart disease Sister   . Arthritis Son     Social History   Social History  . Marital Status: Widowed    Spouse Name: N/A  . Number of Children: N/A  . Years of Education: N/A   Social History Main Topics  . Smoking status: Never Smoker   . Smokeless tobacco: Never Used  . Alcohol Use: No  . Drug Use: No  .  Sexual Activity: Not Asked   Other Topics Concern  . None   Social History Narrative    ECOG Status: 0 - Asymptomatic  Review of Systems: A 12 point ROS discussed and pertinent positives are indicated in the HPI above.  All other systems are negative.  Review of Systems  Vital Signs: BP 154/78 mmHg  Pulse 70  Temp(Src) 97.6 F (36.4 C) (Oral)  Resp 14  Ht 5\' 5"  (1.651 m)  Wt 171 lb (77.565 kg)  BMI 28.46 kg/m2  SpO2 97%  Physical Exam  Constitutional: She is oriented to person, place, and time. She appears well-developed and well-nourished. No distress.  HENT:  Head: Normocephalic and atraumatic.  Eyes: No scleral icterus.  Cardiovascular: Normal rate and  regular rhythm.   Pulmonary/Chest: Effort normal and breath sounds normal.  Abdominal: She exhibits no distension. There is no tenderness.  Neurological: She is alert and oriented to person, place, and time.  Skin: Skin is warm and dry.  Psychiatric: She has a normal mood and affect. Her behavior is normal.  Nursing note and vitals reviewed.   Mallampati Score:     Imaging: Ct Biopsy  04/19/2015  CLINICAL DATA:  Right renal mass EXAM: CT GUIDED CORE BIOPSY OF RIGHT RENAL MASS ANESTHESIA/SEDATION: None PROCEDURE: The procedure risks, benefits, and alternatives were explained to the patient. Questions regarding the procedure were encouraged and answered. The patient understands and consents to the procedure. The right flank was prepped with chlorhexidinein a sterile fashion, and a sterile drape was applied covering the operative field. A sterile gown and sterile gloves were used for the procedure. Local anesthesia was provided with 1% Lidocaine. Utilizing CT fluoroscopic guidance a 17 gauge guiding needle was placed percutaneously into the periphery of the patient's known right renal mass. Multiple 18 gauge core biopsies were then obtained. The guiding needle was then removed in Gel-Foam placed to aid in hemostasis. Followup scanning shows no significant hematoma. The patient tolerated the procedure well was returned to her room in satisfactory condition. COMPLICATIONS: None immediate IMPRESSION: Successful CT-guided biopsy of a right renal mass. Electronically Signed   By: Inez Catalina M.D.   On: 04/19/2015 13:04    Labs:  CBC:  Recent Labs  03/16/15 0812  WBC 5.5  HGB 13.5  HCT 39.9  PLT 231.0    COAGS:  Recent Labs  04/19/15 0802  INR 1.04  APTT 29    BMP:  Recent Labs  08/03/14 0900 03/16/15 0812  NA 136 136  K 5.0 4.4  CL 102 99  CO2 29 30  GLUCOSE 92 87  BUN 18 17  CALCIUM 9.6 9.6  CREATININE 1.0 1.05    LIVER FUNCTION TESTS:  Recent Labs  08/03/14 0900  03/16/15 0812  BILITOT 1.0 0.9  AST 22 16  ALT 16 13  ALKPHOS 67 54  PROT 7.0 6.9  ALBUMIN 4.4 4.1    TUMOR MARKERS: No results for input(s): AFPTM, CEA, CA199, CHROMGRNA in the last 8760 hours.  Assessment and Plan:  81 year old female who appears younger than her stated age with an incidentally detected but biopsy-proven 5 cm right renal cell carcinoma. She is not an ideal surgical candidate for radical nephrectomy but would be a candidate for percutaneous cryotherapy.  The size of this lesion is at the upper limits of what we can treat and maintain good local control. However, at her age even if there is a small amount of residual disease, it would be unlikely that that  small residual disease would progress to the point where it would affect her overall survival.  I discussed her cardiac status and need for anticoagulation with her cardiologist, Dr. Nehemiah Massed, at the current total clinic in [place]. He feels that her cardiac status is adequate and gives verbal clearance for anesthesia. Additionally, he thinks she is safe to come off of her anticoagulant prior to and for 1 day after the procedure. Additionally, if she were to suffer any bleeding complications, keeping her off her anticoagulant for a few more days would still be relatively low risk for stroke.  1.) Please call Heidi Baldwin and update her that after discussing with [facility] we feel she is a good candidate for percutaneous cryotherapy and recommended that she undergo treatment.  2.) Please schedule for cryoablation to be performed at [facility].  Thank you for this interesting consult.  I greatly enjoyed meeting Heidi Baldwin and look forward to participating in their care.  A copy of this report was sent to the requesting provider on this date.  SignedJacqulynn Cadet 05/11/2015, 1:07 PM   I spent a total of  40 Minutes  in face to face in clinical consultation, greater than 50% of which was  counseling/coordinating care for right renal cell cancer.

## 2015-05-12 ENCOUNTER — Telehealth: Payer: Self-pay | Admitting: *Deleted

## 2015-05-12 NOTE — Telephone Encounter (Signed)
I have spoken with Heidi Baldwin to let her know we had clearance for her to go off her anticoagulation from her cardiologist Dr. Nehemiah Massed. The patient states she has been thinking this Cryoablation procedure over and she has decided she wants to take a risk and she does not want to have the procedure. She states she will call us if she changes her mind.

## 2015-05-18 ENCOUNTER — Telehealth: Payer: Self-pay

## 2015-05-18 NOTE — Telephone Encounter (Signed)
  Oncology Nurse Navigator Documentation    Navigator Encounter Type: Telephone (05/18/15 1200) Patient Visit Type: Follow-up (05/18/15 1200)                    Time Spent with Patient: 15 (05/18/15 1200)   Talked with Ms Day regardng her recent appt with IR. She has decided not to have ablation therapy done. Support offered reagarding her decision. Offered her appt with XRT for SBRT consult. At this time she is thinking she does not want to have any form of treatment. She will follow up with Dr Alyson Ingles in 2 weeks. Encouraged her to call if she has any needs/concerns or questions.

## 2015-05-26 ENCOUNTER — Ambulatory Visit: Payer: Commercial Managed Care - HMO | Admitting: Internal Medicine

## 2015-06-02 ENCOUNTER — Other Ambulatory Visit: Payer: Self-pay

## 2015-06-02 MED ORDER — LOSARTAN POTASSIUM 100 MG PO TABS: 100.0000 mg | ORAL_TABLET | Freq: Every day | ORAL | Status: DC

## 2015-06-10 ENCOUNTER — Other Ambulatory Visit: Payer: Self-pay | Admitting: Internal Medicine

## 2015-06-28 ENCOUNTER — Encounter: Payer: Self-pay | Admitting: Internal Medicine

## 2015-06-28 ENCOUNTER — Ambulatory Visit (INDEPENDENT_AMBULATORY_CARE_PROVIDER_SITE_OTHER): Payer: Commercial Managed Care - HMO | Admitting: Internal Medicine

## 2015-06-28 VITALS — BP 128/80 | HR 89 | Temp 97.9°F | Resp 18 | Ht 64.0 in | Wt 176.0 lb

## 2015-06-28 DIAGNOSIS — I2510 Atherosclerotic heart disease of native coronary artery without angina pectoris: Secondary | ICD-10-CM

## 2015-06-28 DIAGNOSIS — K219 Gastro-esophageal reflux disease without esophagitis: Secondary | ICD-10-CM

## 2015-06-28 DIAGNOSIS — R739 Hyperglycemia, unspecified: Secondary | ICD-10-CM

## 2015-06-28 DIAGNOSIS — I495 Sick sinus syndrome: Secondary | ICD-10-CM

## 2015-06-28 DIAGNOSIS — I482 Chronic atrial fibrillation, unspecified: Secondary | ICD-10-CM

## 2015-06-28 DIAGNOSIS — I10 Essential (primary) hypertension: Secondary | ICD-10-CM

## 2015-06-28 DIAGNOSIS — C641 Malignant neoplasm of right kidney, except renal pelvis: Secondary | ICD-10-CM

## 2015-06-28 DIAGNOSIS — E039 Hypothyroidism, unspecified: Secondary | ICD-10-CM

## 2015-06-28 DIAGNOSIS — E7800 Pure hypercholesterolemia, unspecified: Secondary | ICD-10-CM

## 2015-06-28 NOTE — Progress Notes (Signed)
Pre-visit discussion using our clinic review tool. No additional management support is needed unless otherwise documented below in the visit note.  

## 2015-06-28 NOTE — Progress Notes (Signed)
Patient ID: Heidi Baldwin, female   DOB: 1924/01/04, 81 y.o.   MRN: [id number]   Subjective:    Patient ID: Heidi Baldwin, female    DOB: 1924/01/01, 81 y.o.   MRN: [id number]  HPI  Patient with past history of hypercholesterolemia, afib, hypothyroidism and hypertension.  She comes in for a scheduled follow up of these issues.  Recently diagnosed with renal mass - renal cell carcinoma.  Saw Dr Nicolette Bang.  See his note for details.  Pt decided she wanted to monitor for now.  Plan for f/u ultrasound.  No sob.  Stable from a cardiac standpoint.  No acid reflux.  No abdominal pain or cramping.  Bowels stable.     Past Medical History  Diagnosis Date  . Arthritis   . Allergy   . Heart murmur   . Hypothyroidism   . Hypertension   . Hypercholesterolemia   . Atrial fibrillation ([place])   . Hx of actinic keratosis May 2015    under left breast  . Acid reflux   . Heart disease    Past Surgical History  Procedure Laterality Date  . Cholecystectomy    . Abdominal hysterectomy    . Pacemaker insertion  05/03/13  . Corneal transplant      Right x 3 Left x1  . Knee surgery Left   . Cyst removal trunk      Tail bone   Family History  Problem Relation Age of Onset  . Diabetes Mother   . Heart disease Father   . Hyperlipidemia Father   . Heart disease Sister   . Arthritis Son    Social History   Social History  . Marital Status: Widowed    Spouse Name: N/A  . Number of Children: N/A  . Years of Education: N/A   Social History Main Topics  . Smoking status: Never Smoker   . Smokeless tobacco: Never Used  . Alcohol Use: No  . Drug Use: No  . Sexual Activity: Not Asked   Other Topics Concern  . None   Social History Narrative    Outpatient Encounter Prescriptions as of 06/28/2015  Medication Sig  . amLODipine (NORVASC) 5 MG tablet TAKE 1 TABLET (5 MG TOTAL) BY MOUTH DAILY.  Marland Kitchen apixaban (ELIQUIS) 5 MG TABS tablet Take 5 mg by mouth 2 (two) times daily.   Marland Kitchen levothyroxine  (SYNTHROID, LEVOTHROID) 50 MCG tablet TAKE 1 TABLET BY MOUTH DAILY ON EMPTY STOMACH WITH GLASS OF WATER 30-60MINS BEFORE BREAKFAST  . losartan (COZAAR) 100 MG tablet Take 1 tablet (100 mg total) by mouth daily.  . pantoprazole (PROTONIX) 40 MG tablet TAKE 1 TABLET BY MOUTH TWICE A DAY  . spironolactone (ALDACTONE) 25 MG tablet TAKE 1/2 TABLET BY MOUTH EVERY DAY   No facility-administered encounter medications on file as of 06/28/2015.    Review of Systems  Constitutional: Negative for appetite change and unexpected weight change.  HENT: Negative for congestion and sinus pressure.   Respiratory: Negative for cough, chest tightness and shortness of breath.   Cardiovascular: Negative for chest pain, palpitations and leg swelling.  Gastrointestinal: Negative for nausea, vomiting, abdominal pain and diarrhea.  Genitourinary: Negative for dysuria and difficulty urinating.  Musculoskeletal: Negative for myalgias and joint swelling.  Skin: Negative for color change and rash.  Neurological: Negative for dizziness, light-headedness and headaches.  Psychiatric/Behavioral: Negative for dysphoric mood and agitation.       Objective:    Physical Exam  Constitutional: She appears  well-developed and well-nourished. No distress.  HENT:  Nose: Nose normal.  Mouth/Throat: Oropharynx is clear and moist.  Eyes: Conjunctivae are normal. Right eye exhibits no discharge. Left eye exhibits no discharge.  Neck: Neck supple. No thyromegaly present.  Cardiovascular: Normal rate and regular rhythm.   Pulmonary/Chest: Breath sounds normal. No respiratory distress. She has no wheezes.  Abdominal: Soft. Bowel sounds are normal. There is no tenderness.  Musculoskeletal: She exhibits no edema or tenderness.  Lymphadenopathy:    She has no cervical adenopathy.  Skin: No rash noted. No erythema.  Psychiatric: She has a normal mood and affect. Her behavior is normal.    BP 128/80 mmHg  Pulse 89  Temp(Src) 97.9  F (36.6 C) (Oral)  Resp 18  Ht 5' 4"  (1.626 m)  Wt 176 lb (79.833 kg)  BMI 30.20 kg/m2  SpO2 95% Wt Readings from Last 3 Encounters:  06/28/15 176 lb (79.833 kg)  05/11/15 171 lb (77.565 kg)  04/25/15 177 lb 1.6 oz (80.332 kg)     Lab Results  Component Value Date   WBC 5.5 03/16/2015   HGB 13.5 03/16/2015   HCT 39.9 03/16/2015   PLT 231.0 03/16/2015   GLUCOSE 87 03/16/2015   CHOL 200 03/16/2015   TRIG 86.0 03/16/2015   HDL 63.40 03/16/2015   LDLCALC 119* 03/16/2015   ALT 13 03/16/2015   AST 16 03/16/2015   NA 136 03/16/2015   K 4.4 03/16/2015   CL 99 03/16/2015   CREATININE 1.05 03/16/2015   BUN 17 03/16/2015   CO2 30 03/16/2015   TSH 3.38 03/16/2015   INR 1.04 04/19/2015   HGBA1C 5.6 03/16/2015       Assessment & Plan:   Problem List Items Addressed This Visit    Acid reflux    On protonix.  Upper symptoms controlled.        Arteriosclerosis of coronary artery    Followed by cardiology.        Atrial fibrillation ([place])    Followed by cardiology.  Has pace maker in place.  On eliquis.  Stable.       Essential hypertension, benign    Blood pressure under good control.  Continue same medication regimen.  Follow pressures.  Follow metabolic panel.        Relevant Orders   Basic metabolic panel   Hypercholesterolemia    Low cholesterol diet and exercise.  Follow lipid panel.       Relevant Orders   Lipid panel   Hepatic function panel   Hyperglycemia    Low carb diet and exercise.  Follow met b and a1c.       Relevant Orders   Hemoglobin A1c   Hypothyroidism    On thyroid replacement.  Follow tsh.        Renal cell carcinoma ([place])    She sal Dr Alyson Ingles.  See his note for details.  She has elected f/u ultrasound.        Sick sinus syndrome ([place]) - Primary    S/p pace maker placement 2014.  Stable.            Einar Pheasant, MD

## 2015-07-03 ENCOUNTER — Encounter: Payer: Self-pay | Admitting: Internal Medicine

## 2015-07-03 NOTE — Assessment & Plan Note (Signed)
Followed by cardiology 

## 2015-07-03 NOTE — Assessment & Plan Note (Signed)
Low cholesterol diet and exercise.  Follow lipid panel.   

## 2015-07-03 NOTE — Assessment & Plan Note (Signed)
S/p pace maker placement 2014.  Stable.

## 2015-07-03 NOTE — Assessment & Plan Note (Signed)
Followed by cardiology.  Has pacemaker in place.  On eliquis.  Stable.   

## 2015-07-03 NOTE — Assessment & Plan Note (Signed)
On protonix.  Upper symptoms controlled.  

## 2015-07-03 NOTE — Assessment & Plan Note (Signed)
She sal Dr Alyson Ingles.  See his note for details.  She has elected f/u ultrasound.

## 2015-07-03 NOTE — Assessment & Plan Note (Signed)
Blood pressure under good control.  Continue same medication regimen.  Follow pressures.  Follow metabolic panel.   

## 2015-07-03 NOTE — Assessment & Plan Note (Signed)
Low carb diet and exercise.  Follow met b and a1c.  

## 2015-07-03 NOTE — Assessment & Plan Note (Signed)
On thyroid replacement.  Follow tsh.  

## 2015-09-05 ENCOUNTER — Ambulatory Visit (INDEPENDENT_AMBULATORY_CARE_PROVIDER_SITE_OTHER): Payer: Commercial Managed Care - HMO | Admitting: Family Medicine

## 2015-09-05 ENCOUNTER — Encounter: Payer: Self-pay | Admitting: Family Medicine

## 2015-09-05 VITALS — BP 126/62 | HR 82 | Temp 98.0°F | Ht 64.0 in | Wt 174.5 lb

## 2015-09-05 DIAGNOSIS — R059 Cough, unspecified: Secondary | ICD-10-CM | POA: Insufficient documentation

## 2015-09-05 DIAGNOSIS — R05 Cough: Secondary | ICD-10-CM | POA: Insufficient documentation

## 2015-09-05 MED ORDER — HYDROCOD POLST-CPM POLST ER 10-8 MG/5ML PO SUER: 5.0000 mL | Freq: Two times a day (BID) | ORAL | Status: DC | PRN

## 2015-09-05 MED ORDER — PREDNISONE 50 MG PO TABS: ORAL_TABLET | ORAL | Status: DC

## 2015-09-05 NOTE — Patient Instructions (Signed)
Use the cough medication and take the prednisone as prescribed.  Let us know if you worsen or fail to improve.  Take care   Dr. Lacinda Axon

## 2015-09-05 NOTE — Progress Notes (Signed)
   Subjective:  Patient ID: Heidi Baldwin, female    DOB: [birth year]  Age: 82 y.o. MRN: [id number]  CC: Cough  HPI:  82 year old female with a complicated past medical history presents to clinic today with cough.  Cough  Has been present for the past 3 days.  Mostly dry. Slightly productive at times.  She reports mild shortness of breath with cough.  No fever or chills.  No relieving factors.  Patient feels like this is been exacerbated by neighbors who were burning as well as other environmental allergies.  She states that her son wanted her to be evaluated to make sure she didn't have pneumonia.  No other complaints at this time.  Social Hx   Social History   Social History  . Marital Status: Widowed    Spouse Name: N/A  . Number of Children: N/A  . Years of Education: N/A   Social History Main Topics  . Smoking status: Never Smoker   . Smokeless tobacco: Never Used  . Alcohol Use: No  . Drug Use: No  . Sexual Activity: Not Asked   Other Topics Concern  . None   Social History Narrative   Review of Systems  Constitutional: Negative.   Respiratory: Positive for cough and shortness of breath.    Objective:  BP 126/62 mmHg  Pulse 82  Temp(Src) 98 F (36.7 C) (Oral)  Ht 5\' 4"  (1.626 m)  Wt 174 lb 8 oz (79.153 kg)  BMI 29.94 kg/m2  SpO2 97%  BP/Weight 09/05/2015 06/28/2015 [id number]  Systolic BP [id number]  Diastolic BP 62 80 78  Wt. (Lbs) 174.5 176 171  BMI 29.94 30.2 28.46   Physical Exam  Constitutional: She appears well-developed. No distress.  HENT:  Head: Normocephalic and atraumatic.  Mouth/Throat: Oropharynx is clear and moist.  Cardiovascular: Normal rate and regular rhythm.   Murmur heard. Pulmonary/Chest: Effort normal and breath sounds normal. No respiratory distress. She has no wheezes. She has no rales.  Neurological: She is alert.  Psychiatric: She has a normal mood and affect.  Vitals reviewed.  Lab Results  Component Value  Date   WBC 5.5 03/16/2015   HGB 13.5 03/16/2015   HCT 39.9 03/16/2015   PLT 231.0 03/16/2015   GLUCOSE 87 03/16/2015   CHOL 200 03/16/2015   TRIG 86.0 03/16/2015   HDL 63.40 03/16/2015   LDLCALC 119* 03/16/2015   ALT 13 03/16/2015   AST 16 03/16/2015   NA 136 03/16/2015   K 4.4 03/16/2015   CL 99 03/16/2015   CREATININE 1.05 03/16/2015   BUN 17 03/16/2015   CO2 30 03/16/2015   TSH 3.38 03/16/2015   INR 1.04 04/19/2015   HGBA1C 5.6 03/16/2015    Assessment & Plan:   Problem List Items Addressed This Visit    Cough - Primary    New problem. Allergic/irritant vs from acute bronchitis. Treating with prednisone and Tussionex.         Meds ordered this encounter  Medications  . predniSONE (DELTASONE) 50 MG tablet    Sig: 1 tablet daily x 5 days.    Dispense:  5 tablet    Refill:  0  . chlorpheniramine-HYDROcodone (TUSSIONEX PENNKINETIC ER) 10-8 MG/5ML SUER    Sig: Take 5 mLs by mouth every 12 (twelve) hours as needed.    Dispense:  115 mL    Refill:  0    Follow-up: PRN  [place]

## 2015-09-05 NOTE — Assessment & Plan Note (Signed)
New problem. Allergic/irritant vs from acute bronchitis. Treating with prednisone and Tussionex.

## 2015-09-05 NOTE — Progress Notes (Signed)
Pre visit review using our clinic review tool, if applicable. No additional management support is needed unless otherwise documented below in the visit note. 

## 2015-09-15 ENCOUNTER — Other Ambulatory Visit: Payer: Self-pay | Admitting: Internal Medicine

## 2015-09-26 ENCOUNTER — Other Ambulatory Visit (INDEPENDENT_AMBULATORY_CARE_PROVIDER_SITE_OTHER): Payer: Commercial Managed Care - HMO

## 2015-09-26 ENCOUNTER — Encounter: Payer: Self-pay | Admitting: Internal Medicine

## 2015-09-26 DIAGNOSIS — R739 Hyperglycemia, unspecified: Secondary | ICD-10-CM | POA: Diagnosis not present

## 2015-09-26 DIAGNOSIS — E7800 Pure hypercholesterolemia, unspecified: Secondary | ICD-10-CM

## 2015-09-26 DIAGNOSIS — I10 Essential (primary) hypertension: Secondary | ICD-10-CM | POA: Diagnosis not present

## 2015-09-26 LAB — LIPID PANEL
CHOLESTEROL: 216 mg/dL — AB (ref 0–200)
HDL: 71.9 mg/dL (ref >39.00)
LDL Cholesterol: 127 mg/dL — ABNORMAL HIGH (ref 0–99)
NonHDL: 144.1
Total CHOL/HDL Ratio: 3
Triglycerides: 87 mg/dL (ref 0.0–149.0)
VLDL: 17.4 mg/dL (ref 0.0–40.0)

## 2015-09-26 LAB — BASIC METABOLIC PANEL
BUN: 17 mg/dL (ref 6–23)
CALCIUM: 9.6 mg/dL (ref 8.4–10.5)
CHLORIDE: 100 meq/L (ref 96–112)
CO2: 28 meq/L (ref 19–32)
CREATININE: 1.01 mg/dL (ref 0.40–1.20)
GFR: 54.73 mL/min — ABNORMAL LOW (ref >60.00)
Glucose, Bld: 99 mg/dL (ref 70–99)
Potassium: 4 mEq/L (ref 3.5–5.1)
Sodium: 135 mEq/L (ref 135–145)

## 2015-09-26 LAB — HEPATIC FUNCTION PANEL
ALBUMIN: 4.2 g/dL (ref 3.5–5.2)
ALK PHOS: 54 U/L (ref 39–117)
ALT: 12 U/L (ref 0–35)
AST: 16 U/L (ref 0–37)
BILIRUBIN TOTAL: 1 mg/dL (ref 0.2–1.2)
Bilirubin, Direct: 0.2 mg/dL (ref 0.0–0.3)
Total Protein: 6.8 g/dL (ref 6.0–8.3)

## 2015-09-26 LAB — HEMOGLOBIN A1C: HEMOGLOBIN A1C: 5.9 % (ref 4.6–6.5)

## 2015-10-10 ENCOUNTER — Telehealth: Payer: Self-pay | Admitting: Internal Medicine

## 2015-10-10 NOTE — Telephone Encounter (Deleted)
Pt son called needing a referral for the cardiologist Dr Nehemiah Massed. Pt appt is 11/08/2015 @ 2:30pm. Call pt @ [phone]. Thank you!

## 2015-10-11 ENCOUNTER — Encounter: Payer: Self-pay | Admitting: Internal Medicine

## 2015-10-11 NOTE — Telephone Encounter (Signed)
I don't see any information on this referral.

## 2015-10-11 NOTE — Telephone Encounter (Signed)
She sees Dr Nehemiah Massed for her heart issues.  See her my chart message.  I think what she may mean by her message is that she has an appt scheduled with cardiology on 11/08/15 and she needs a referral.  Do I need to place an order for the referral?  Can confirm if correct.  Thanks.    Dr Nicki Reaper

## 2015-10-27 ENCOUNTER — Ambulatory Visit (INDEPENDENT_AMBULATORY_CARE_PROVIDER_SITE_OTHER): Payer: Commercial Managed Care - HMO | Admitting: Internal Medicine

## 2015-10-27 ENCOUNTER — Encounter: Payer: Self-pay | Admitting: Internal Medicine

## 2015-10-27 VITALS — BP 140/80 | HR 86 | Temp 97.8°F | Resp 18 | Ht 64.0 in | Wt 174.5 lb

## 2015-10-27 DIAGNOSIS — I482 Chronic atrial fibrillation, unspecified: Secondary | ICD-10-CM

## 2015-10-27 DIAGNOSIS — C641 Malignant neoplasm of right kidney, except renal pelvis: Secondary | ICD-10-CM

## 2015-10-27 DIAGNOSIS — E7800 Pure hypercholesterolemia, unspecified: Secondary | ICD-10-CM

## 2015-10-27 DIAGNOSIS — E669 Obesity, unspecified: Secondary | ICD-10-CM

## 2015-10-27 DIAGNOSIS — K219 Gastro-esophageal reflux disease without esophagitis: Secondary | ICD-10-CM | POA: Diagnosis not present

## 2015-10-27 DIAGNOSIS — E039 Hypothyroidism, unspecified: Secondary | ICD-10-CM

## 2015-10-27 DIAGNOSIS — I495 Sick sinus syndrome: Secondary | ICD-10-CM | POA: Diagnosis not present

## 2015-10-27 DIAGNOSIS — I10 Essential (primary) hypertension: Secondary | ICD-10-CM | POA: Diagnosis not present

## 2015-10-27 NOTE — Progress Notes (Signed)
Patient ID: Heidi Baldwin, female   DOB: 1923-08-30, 82 y.o.   MRN: [id number]   Subjective:    Patient ID: Heidi Baldwin, female    DOB: [birth year], 82 y.o.   MRN: [id number]  HPI  Patient here for a scheduled follow up.  She is still having msk complaints - back and leg pain.  Takes tylenol.  Stable. Tries to stay active.  No chest pain or tightness.  No sob.  Breathing stable.  Due to f/u with Dr Nehemiah Massed 11/08/15.  Eating.  No nausea or vomiting.  No abdominal pain or cramping.  Bowels stable.  Handling stress well.     Past Medical History  Diagnosis Date  . Arthritis   . Allergy   . Heart murmur   . Hypothyroidism   . Hypertension   . Hypercholesterolemia   . Atrial fibrillation ([place])   . Hx of actinic keratosis May 2015    under left breast  . Acid reflux   . Heart disease    Past Surgical History  Procedure Laterality Date  . Cholecystectomy    . Abdominal hysterectomy    . Pacemaker insertion  05/03/13  . Corneal transplant      Right x 3 Left x1  . Knee surgery Left   . Cyst removal trunk      Tail bone   Family History  Problem Relation Age of Onset  . Diabetes Mother   . Heart disease Father   . Hyperlipidemia Father   . Heart disease Sister   . Arthritis Son    Social History   Social History  . Marital Status: Widowed    Spouse Name: N/A  . Number of Children: N/A  . Years of Education: N/A   Social History Main Topics  . Smoking status: Never Smoker   . Smokeless tobacco: Never Used  . Alcohol Use: No  . Drug Use: No  . Sexual Activity: Not Asked   Other Topics Concern  . None   Social History Narrative    Outpatient Encounter Prescriptions as of 10/27/2015  Medication Sig  . amLODipine (NORVASC) 5 MG tablet TAKE 1 TABLET (5 MG TOTAL) BY MOUTH DAILY.  Marland Kitchen amLODipine (NORVASC) 5 MG tablet TAKE 1 TABLET (5 MG TOTAL) BY MOUTH DAILY.  Marland Kitchen apixaban (ELIQUIS) 5 MG TABS tablet Take 5 mg by mouth 2 (two) times daily.   Marland Kitchen levothyroxine (SYNTHROID,  LEVOTHROID) 50 MCG tablet TAKE 1 TABLET BY MOUTH DAILY ON EMPTY STOMACH WITH GLASS OF WATER 30-60MINS BEFORE BREAKFAST  . losartan (COZAAR) 100 MG tablet Take 1 tablet (100 mg total) by mouth daily.  . pantoprazole (PROTONIX) 40 MG tablet TAKE 1 TABLET BY MOUTH TWICE A DAY  . spironolactone (ALDACTONE) 25 MG tablet TAKE 1/2 TABLET BY MOUTH EVERY DAY  . [DISCONTINUED] chlorpheniramine-HYDROcodone (TUSSIONEX PENNKINETIC ER) 10-8 MG/5ML SUER Take 5 mLs by mouth every 12 (twelve) hours as needed.  . [DISCONTINUED] predniSONE (DELTASONE) 50 MG tablet 1 tablet daily x 5 days.   No facility-administered encounter medications on file as of 10/27/2015.    Review of Systems  Constitutional: Negative for appetite change and unexpected weight change.  HENT: Negative for congestion and sinus pressure.   Respiratory: Negative for cough, chest tightness and shortness of breath.   Cardiovascular: Negative for chest pain, palpitations and leg swelling.  Gastrointestinal: Negative for nausea, vomiting, abdominal pain and diarrhea.  Genitourinary: Negative for dysuria and difficulty urinating.  Musculoskeletal: Positive for back pain.  Leg pain.  Stable.   Skin: Negative for color change and rash.  Neurological: Negative for dizziness, light-headedness and headaches.  Psychiatric/Behavioral: Negative for dysphoric mood and agitation.       Objective:     Blood pressure rechecked by me:  132/70  Physical Exam  Constitutional: She appears well-developed and well-nourished. No distress.  HENT:  Nose: Nose normal.  Mouth/Throat: Oropharynx is clear and moist.  Neck: Neck supple. No thyromegaly present.  Cardiovascular: Normal rate and regular rhythm.   Pulmonary/Chest: Breath sounds normal. No respiratory distress. She has no wheezes.  Abdominal: Soft. Bowel sounds are normal. There is no tenderness.  Musculoskeletal: She exhibits no edema or tenderness.  Lymphadenopathy:    She has no cervical  adenopathy.  Skin: No rash noted. No erythema.  Psychiatric: She has a normal mood and affect. Her behavior is normal.    BP 140/80 mmHg  Pulse 86  Temp(Src) 97.8 F (36.6 C) (Oral)  Resp 18  Ht 5\' 4"  (1.626 m)  Wt 174 lb 8 oz (79.153 kg)  BMI 29.94 kg/m2  SpO2 97% Wt Readings from Last 3 Encounters:  10/27/15 174 lb 8 oz (79.153 kg)  09/05/15 174 lb 8 oz (79.153 kg)  06/28/15 176 lb (79.833 kg)     Lab Results  Component Value Date   WBC 5.5 03/16/2015   HGB 13.5 03/16/2015   HCT 39.9 03/16/2015   PLT 231.0 03/16/2015   GLUCOSE 99 09/26/2015   CHOL 216* 09/26/2015   TRIG 87.0 09/26/2015   HDL 71.90 09/26/2015   LDLCALC 127* 09/26/2015   ALT 12 09/26/2015   AST 16 09/26/2015   NA 135 09/26/2015   K 4.0 09/26/2015   CL 100 09/26/2015   CREATININE 1.01 09/26/2015   BUN 17 09/26/2015   CO2 28 09/26/2015   TSH 3.38 03/16/2015   INR 1.04 04/19/2015   HGBA1C 5.9 09/26/2015       Assessment & Plan:   Problem List Items Addressed This Visit    Acid reflux    On protonix.  Symptoms controlled.        Atrial fibrillation ([place])    Has pacemaker in place.  Followed by cardiology.  On eliquis.        Essential hypertension, benign    Blood pressure under good control.  Continue same medication regimen.  Follow pressures.  Follow metabolic panel.        Relevant Orders   Basic metabolic panel   Hypercholesterolemia    Low cholesterol diet and exercise.  Follow lipid panel.        Relevant Orders   Lipid panel   Hepatic function panel   Hemoglobin A1c   Hypothyroidism    On thyroid replacement.  Follow tsh.       Obesity (BMI 30-39.9)    Diet and exercise.        Renal cell carcinoma ([place])    Has a renal mass.  Has decided to not pursue any further scanning or f/u for the renal mass.        Sick sinus syndrome ([place]) - Primary    S/p pacemaker placement.  Followed by cardiology.           Einar Pheasant, MD

## 2015-10-27 NOTE — Progress Notes (Signed)
Pre-visit discussion using our clinic review tool. No additional management support is needed unless otherwise documented below in the visit note.  

## 2015-10-30 ENCOUNTER — Encounter: Payer: Self-pay | Admitting: Internal Medicine

## 2015-10-30 NOTE — Assessment & Plan Note (Signed)
Has a renal mass.  Has decided to not pursue any further scanning or f/u for the renal mass.

## 2015-10-30 NOTE — Assessment & Plan Note (Signed)
Blood pressure under good control.  Continue same medication regimen.  Follow pressures.  Follow metabolic panel.   

## 2015-10-30 NOTE — Assessment & Plan Note (Signed)
S/p pacemaker placement.  Followed by cardiology.  

## 2015-10-30 NOTE — Assessment & Plan Note (Signed)
On thyroid replacement.  Follow tsh.  

## 2015-10-30 NOTE — Assessment & Plan Note (Signed)
Has pacemaker in place.  Followed by cardiology.  On eliquis.

## 2015-10-30 NOTE — Assessment & Plan Note (Signed)
Low cholesterol diet and exercise.  Follow lipid panel.   

## 2015-10-30 NOTE — Assessment & Plan Note (Signed)
Diet and exercise.   

## 2015-10-30 NOTE — Assessment & Plan Note (Signed)
On protonix.  Symptoms controlled.   

## 2015-11-08 DIAGNOSIS — I482 Chronic atrial fibrillation: Secondary | ICD-10-CM | POA: Diagnosis not present

## 2015-11-08 DIAGNOSIS — I272 Other secondary pulmonary hypertension: Secondary | ICD-10-CM | POA: Diagnosis not present

## 2015-11-08 DIAGNOSIS — I119 Hypertensive heart disease without heart failure: Secondary | ICD-10-CM | POA: Diagnosis not present

## 2015-11-08 DIAGNOSIS — I495 Sick sinus syndrome: Secondary | ICD-10-CM | POA: Diagnosis not present

## 2015-11-08 DIAGNOSIS — Z950 Presence of cardiac pacemaker: Secondary | ICD-10-CM | POA: Diagnosis not present

## 2015-11-15 ENCOUNTER — Other Ambulatory Visit: Payer: Self-pay | Admitting: Internal Medicine

## 2015-12-08 ENCOUNTER — Other Ambulatory Visit: Payer: Self-pay | Admitting: Internal Medicine

## 2015-12-12 ENCOUNTER — Other Ambulatory Visit: Payer: Self-pay | Admitting: Internal Medicine

## 2016-01-20 DIAGNOSIS — H3562 Retinal hemorrhage, left eye: Secondary | ICD-10-CM | POA: Diagnosis not present

## 2016-01-23 DIAGNOSIS — H3581 Retinal edema: Secondary | ICD-10-CM | POA: Diagnosis not present

## 2016-01-23 DIAGNOSIS — H401120 Primary open-angle glaucoma, left eye, stage unspecified: Secondary | ICD-10-CM | POA: Diagnosis not present

## 2016-01-23 DIAGNOSIS — H3562 Retinal hemorrhage, left eye: Secondary | ICD-10-CM | POA: Diagnosis not present

## 2016-01-23 DIAGNOSIS — H353110 Nonexudative age-related macular degeneration, right eye, stage unspecified: Secondary | ICD-10-CM | POA: Diagnosis not present

## 2016-01-23 DIAGNOSIS — H2511 Age-related nuclear cataract, right eye: Secondary | ICD-10-CM | POA: Diagnosis not present

## 2016-01-25 ENCOUNTER — Telehealth: Payer: Self-pay | Admitting: Internal Medicine

## 2016-02-13 IMAGING — CT CT BIOPSY
2 of 4 series · 14 of 32 positions shown, 19 images · non-contrast
Comparison: none

CLINICAL DATA: Right renal mass

[Series 2: routine abdomen · axial · 0.68mm/px · z∈[-282,-128]mm · 7 of 43 slices shown, 12 images]
[im 6/43  soft-tissue]
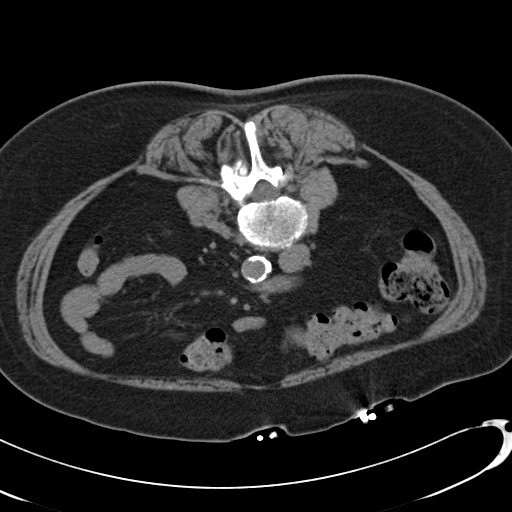
[im 6/43  bone]
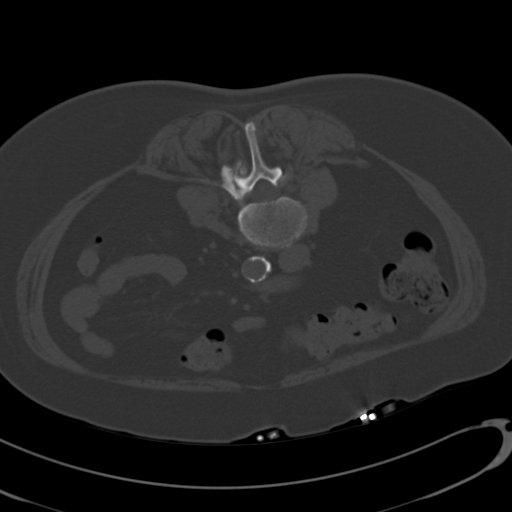
[im 11/43  soft-tissue]
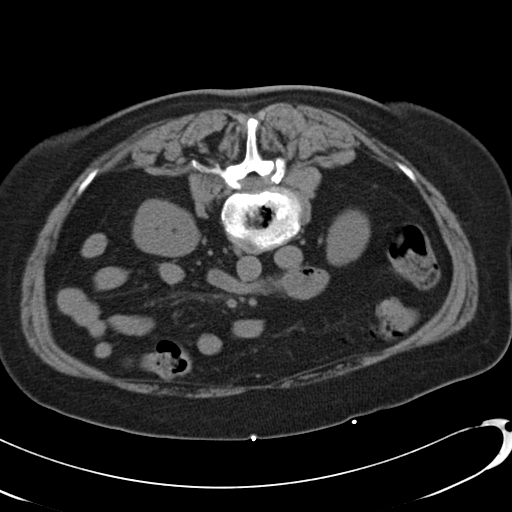
[im 16/43  soft-tissue]
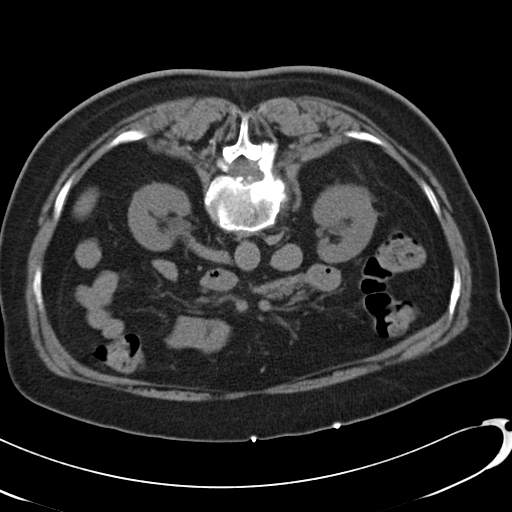
[im 22/43  soft-tissue]
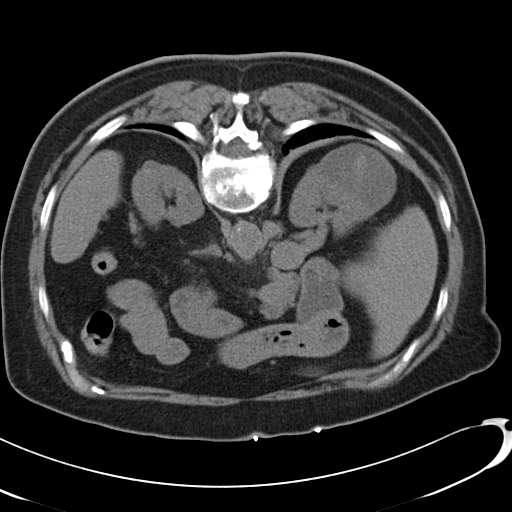
[im 22/43  lung]
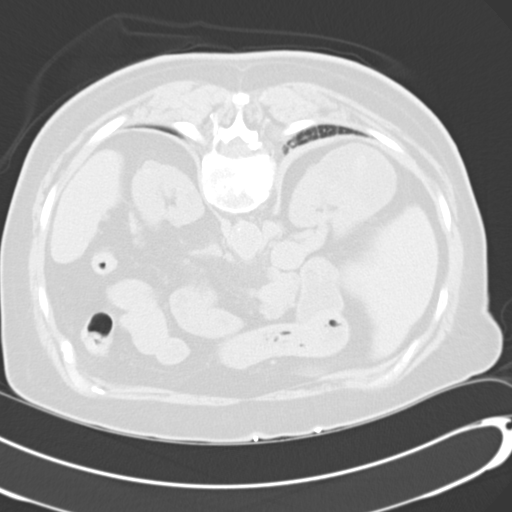
[im 27/43  soft-tissue]
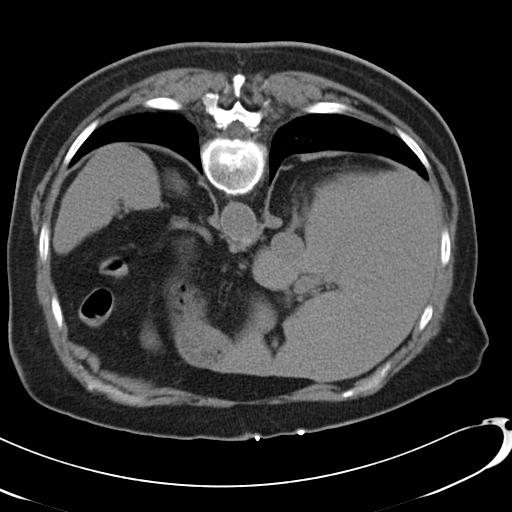
[im 27/43  lung]
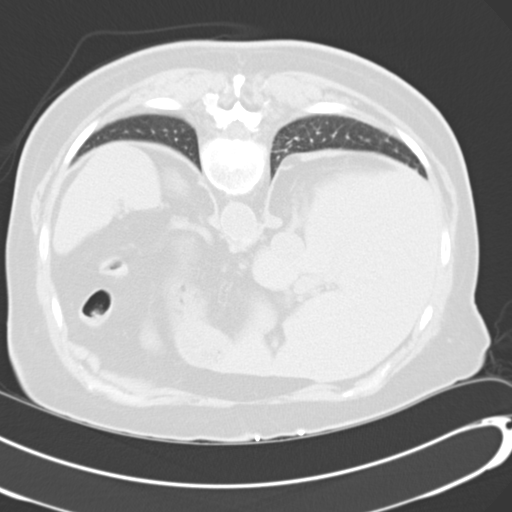
[im 32/43  soft-tissue]
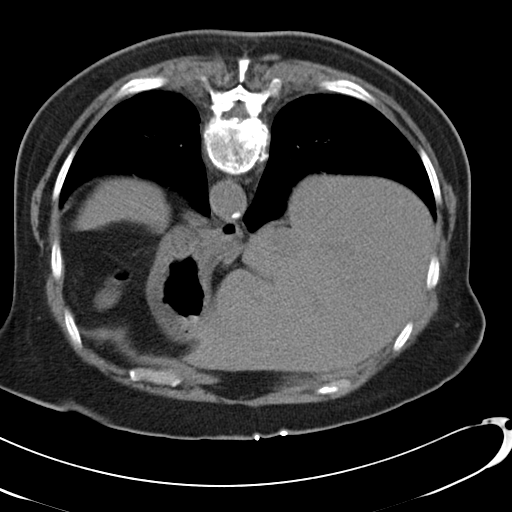
[im 32/43  lung]
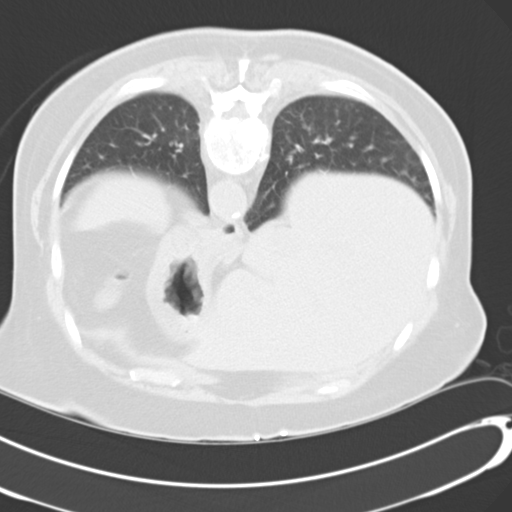
[im 37/43  soft-tissue]
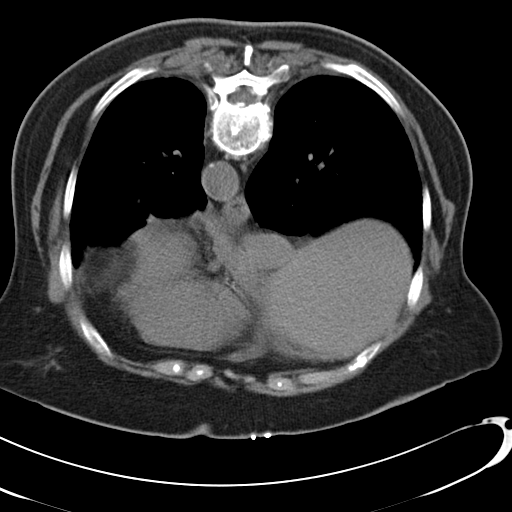
[im 37/43  lung]
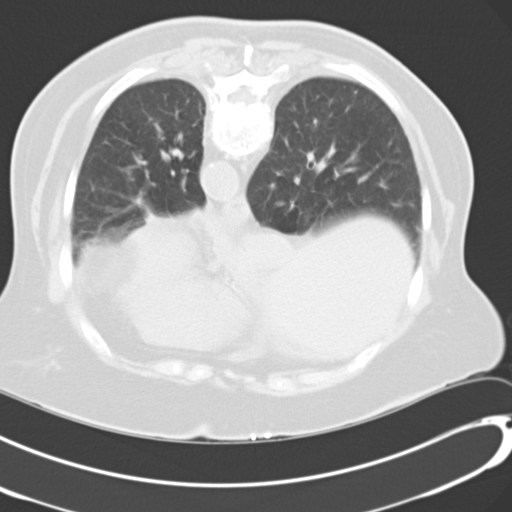

[Series 3: biopsy 2.4 b30s · axial · 0.74mm/px · z∈[-224,-214]mm · 7 of 90 slices shown]
[im 6/90  soft-tissue]
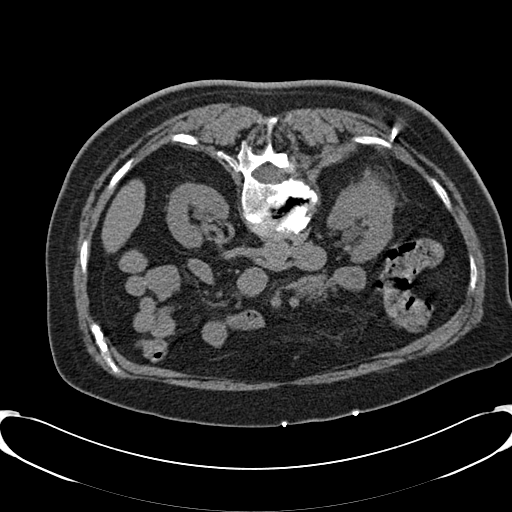
[im 17/90  soft-tissue]
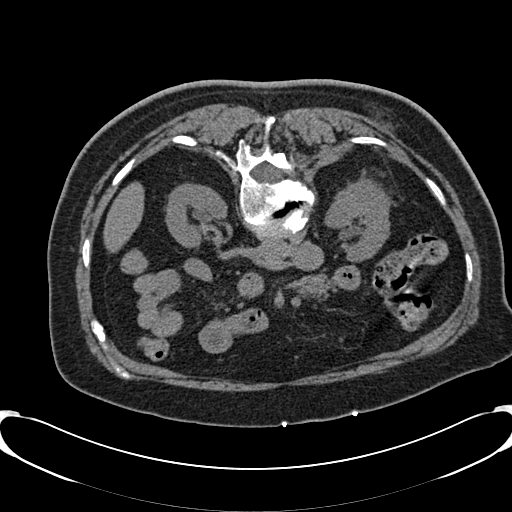
[im 28/90  soft-tissue]
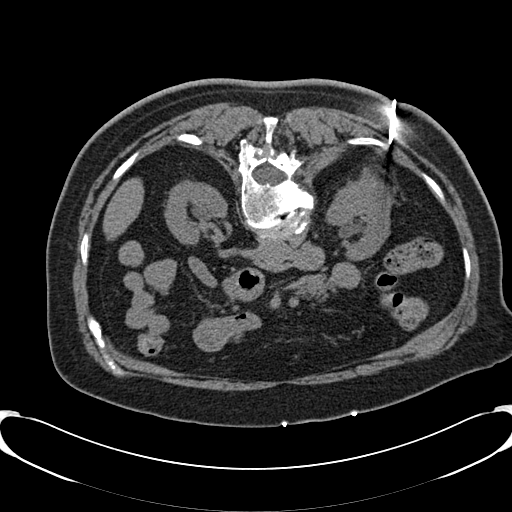
[im 39/90  soft-tissue]
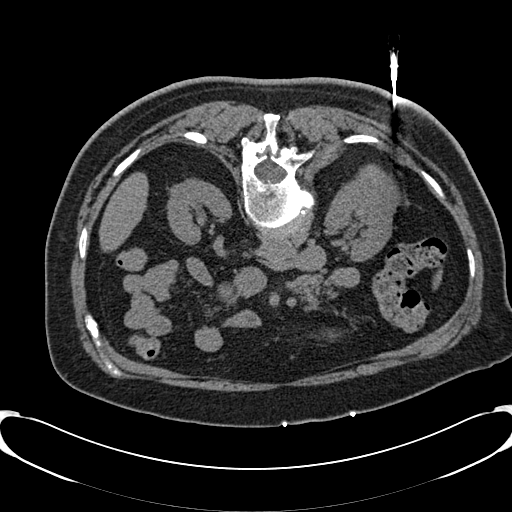
[im 51/90  soft-tissue]
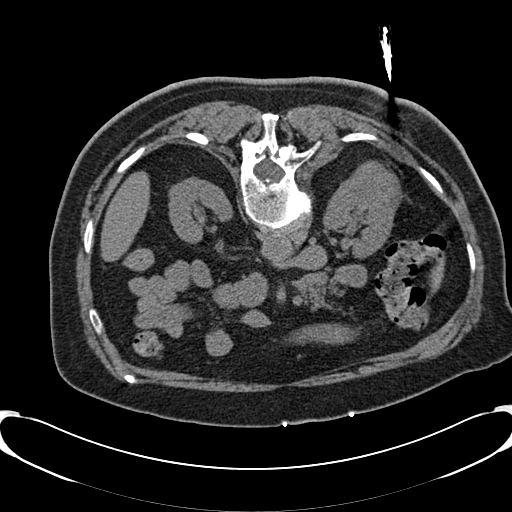
[im 62/90  soft-tissue]
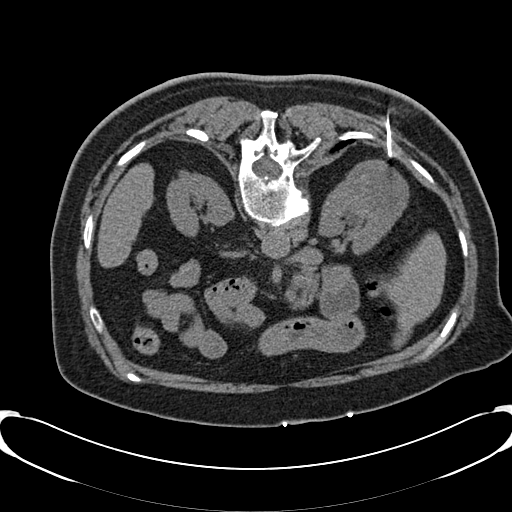
[im 73/90  soft-tissue]
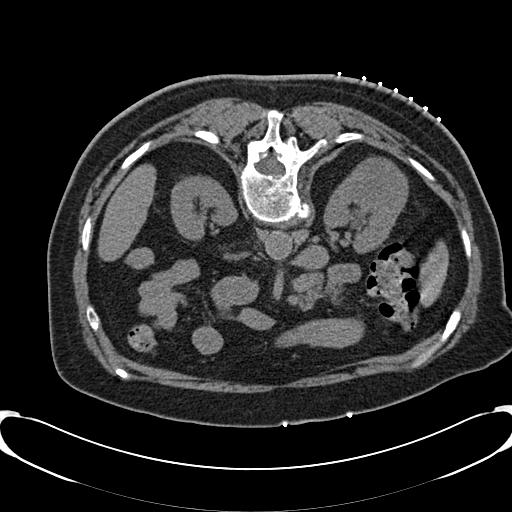

[14 of 32 positions shown; findings below may reference images not displayed]

EXAM:
CT GUIDED CORE BIOPSY OF RIGHT RENAL MASS

ANESTHESIA/SEDATION:
None

PROCEDURE:
The procedure risks, benefits, and alternatives were explained to
the patient. Questions regarding the procedure were encouraged and
answered. The patient understands and consents to the procedure.

The right flank was prepped with chlorhexidinein a sterile fashion,
and a sterile drape was applied covering the operative field. A
sterile gown and sterile gloves were used for the procedure. Local
anesthesia was provided with 1% Lidocaine.

Utilizing CT fluoroscopic guidance a 17 gauge guiding needle was
placed percutaneously into the periphery of the patient's known
right renal mass. Multiple 18 gauge core biopsies were then
obtained. The guiding needle was then removed in Gel-Foam placed to
aid in hemostasis. Followup scanning shows no significant hematoma.
The patient tolerated the procedure well was returned to her room in
satisfactory condition.

COMPLICATIONS:
None immediate
IMPRESSION: Successful CT-guided biopsy of a right renal mass.

## 2016-02-22 ENCOUNTER — Other Ambulatory Visit: Payer: Commercial Managed Care - HMO

## 2016-02-27 ENCOUNTER — Encounter: Payer: Commercial Managed Care - HMO | Admitting: Internal Medicine

## 2016-03-08 ENCOUNTER — Other Ambulatory Visit: Payer: Self-pay | Admitting: Internal Medicine

## 2016-03-09 DIAGNOSIS — H2511 Age-related nuclear cataract, right eye: Secondary | ICD-10-CM | POA: Diagnosis not present

## 2016-03-09 DIAGNOSIS — H3530 Unspecified macular degeneration: Secondary | ICD-10-CM | POA: Diagnosis not present

## 2016-03-09 DIAGNOSIS — H402220 Chronic angle-closure glaucoma, left eye, stage unspecified: Secondary | ICD-10-CM | POA: Diagnosis not present

## 2016-03-09 DIAGNOSIS — H3562 Retinal hemorrhage, left eye: Secondary | ICD-10-CM | POA: Diagnosis not present

## 2016-04-02 ENCOUNTER — Encounter: Payer: Self-pay | Admitting: Internal Medicine

## 2016-04-02 DIAGNOSIS — I340 Nonrheumatic mitral (valve) insufficiency: Secondary | ICD-10-CM

## 2016-04-02 DIAGNOSIS — I2510 Atherosclerotic heart disease of native coronary artery without angina pectoris: Secondary | ICD-10-CM

## 2016-04-02 NOTE — Telephone Encounter (Signed)
Pt sent my chart message.  She already has appt scheduled with Dr Nehemiah Massed 05/10/16.  States just needs referral.  Order placed for referral.

## 2016-04-09 ENCOUNTER — Other Ambulatory Visit (INDEPENDENT_AMBULATORY_CARE_PROVIDER_SITE_OTHER): Payer: Commercial Managed Care - HMO

## 2016-04-09 DIAGNOSIS — E7800 Pure hypercholesterolemia, unspecified: Secondary | ICD-10-CM

## 2016-04-09 DIAGNOSIS — I10 Essential (primary) hypertension: Secondary | ICD-10-CM

## 2016-04-09 LAB — BASIC METABOLIC PANEL
BUN: 19 mg/dL (ref 6–23)
CALCIUM: 9.5 mg/dL (ref 8.4–10.5)
CO2: 31 mEq/L (ref 19–32)
CREATININE: 1.06 mg/dL (ref 0.40–1.20)
Chloride: 100 mEq/L (ref 96–112)
GFR: 51.7 mL/min — AB (ref >60.00)
GLUCOSE: 90 mg/dL (ref 70–99)
Potassium: 4.3 mEq/L (ref 3.5–5.1)
Sodium: 137 mEq/L (ref 135–145)

## 2016-04-09 LAB — LIPID PANEL
CHOL/HDL RATIO: 3
CHOLESTEROL: 207 mg/dL — AB (ref 0–200)
HDL: 69.6 mg/dL (ref >39.00)
LDL Cholesterol: 124 mg/dL — ABNORMAL HIGH (ref 0–99)
NonHDL: 137.79
TRIGLYCERIDES: 69 mg/dL (ref 0.0–149.0)
VLDL: 13.8 mg/dL (ref 0.0–40.0)

## 2016-04-09 LAB — HEPATIC FUNCTION PANEL
ALBUMIN: 4.2 g/dL (ref 3.5–5.2)
ALK PHOS: 58 U/L (ref 39–117)
ALT: 16 U/L (ref 0–35)
AST: 16 U/L (ref 0–37)
Bilirubin, Direct: 0.1 mg/dL (ref 0.0–0.3)
TOTAL PROTEIN: 7 g/dL (ref 6.0–8.3)
Total Bilirubin: 0.8 mg/dL (ref 0.2–1.2)

## 2016-04-09 LAB — HEMOGLOBIN A1C: Hgb A1c MFr Bld: 5.5 % (ref 4.6–6.5)

## 2016-04-10 ENCOUNTER — Encounter: Payer: Self-pay | Admitting: Internal Medicine

## 2016-04-20 ENCOUNTER — Encounter: Payer: Self-pay | Admitting: Internal Medicine

## 2016-04-20 ENCOUNTER — Ambulatory Visit (INDEPENDENT_AMBULATORY_CARE_PROVIDER_SITE_OTHER): Payer: Commercial Managed Care - HMO | Admitting: Internal Medicine

## 2016-04-20 VITALS — BP 136/72 | HR 87 | Temp 98.0°F | Ht 63.0 in | Wt 177.4 lb

## 2016-04-20 DIAGNOSIS — Z23 Encounter for immunization: Secondary | ICD-10-CM

## 2016-04-20 DIAGNOSIS — C641 Malignant neoplasm of right kidney, except renal pelvis: Secondary | ICD-10-CM | POA: Diagnosis not present

## 2016-04-20 DIAGNOSIS — I482 Chronic atrial fibrillation, unspecified: Secondary | ICD-10-CM

## 2016-04-20 DIAGNOSIS — I10 Essential (primary) hypertension: Secondary | ICD-10-CM

## 2016-04-20 DIAGNOSIS — G8929 Other chronic pain: Secondary | ICD-10-CM

## 2016-04-20 DIAGNOSIS — I495 Sick sinus syndrome: Secondary | ICD-10-CM

## 2016-04-20 DIAGNOSIS — H547 Unspecified visual loss: Secondary | ICD-10-CM

## 2016-04-20 DIAGNOSIS — Z0000 Encounter for general adult medical examination without abnormal findings: Secondary | ICD-10-CM

## 2016-04-20 DIAGNOSIS — M545 Low back pain: Secondary | ICD-10-CM

## 2016-04-20 DIAGNOSIS — E7800 Pure hypercholesterolemia, unspecified: Secondary | ICD-10-CM

## 2016-04-20 DIAGNOSIS — E669 Obesity, unspecified: Secondary | ICD-10-CM | POA: Diagnosis not present

## 2016-04-20 DIAGNOSIS — E038 Other specified hypothyroidism: Secondary | ICD-10-CM

## 2016-04-20 DIAGNOSIS — K219 Gastro-esophageal reflux disease without esophagitis: Secondary | ICD-10-CM

## 2016-04-20 MED ORDER — TRAMADOL HCL 50 MG PO TABS: 50.0000 mg | ORAL_TABLET | Freq: Every evening | ORAL | 1 refills | Status: DC | PRN

## 2016-04-20 NOTE — Progress Notes (Signed)
Patient ID: Heidi Baldwin, female   DOB: Aug 06, 1923, 82 y.o.   MRN: [id number]   Subjective:    Patient ID: Heidi Baldwin, female    DOB: August 20, 1923, 81 y.o.   MRN: [id number]  HPI  Patient here for her physical exam.  She states she has been doing relatively well except for increased msk pain. She is taking tylenol.  Does not help.  Unable to take antiinflammatories.  Discussed tramadol.  She states she does not think she has tried this previously.  Feels if she could get the pain under control, she would be doing well.  Stays active around her house.  No chest pain.  Breathing stable.  Eating and drinking well.  No nausea or vomiting.  Bowels stable.  Taking preservision for her eyes.     Past Medical History:  Diagnosis Date  . Acid reflux   . Allergy   . Arthritis   . Atrial fibrillation ([place])   . Heart disease   . Heart murmur   . Hx of actinic keratosis May 2015   under left breast  . Hypercholesterolemia   . Hypertension   . Hypothyroidism    Past Surgical History:  Procedure Laterality Date  . ABDOMINAL HYSTERECTOMY    . CHOLECYSTECTOMY    . CORNEAL TRANSPLANT     Right x 3 Left x1  . CYST REMOVAL TRUNK     Tail bone  . KNEE SURGERY Left   . PACEMAKER INSERTION  05/03/13   Family History  Problem Relation Age of Onset  . Diabetes Mother   . Heart disease Father   . Hyperlipidemia Father   . Heart disease Sister   . Arthritis Son    Social History   Social History  . Marital status: Widowed    Spouse name: N/A  . Number of children: N/A  . Years of education: N/A   Social History Main Topics  . Smoking status: Never Smoker  . Smokeless tobacco: Never Used  . Alcohol use No  . Drug use: No  . Sexual activity: Not Asked   Other Topics Concern  . None   Social History Narrative  . None    Outpatient Encounter Prescriptions as of 04/20/2016  Medication Sig  . amLODipine (NORVASC) 5 MG tablet TAKE 1 TABLET (5 MG TOTAL) BY MOUTH DAILY.  Marland Kitchen amLODipine  (NORVASC) 5 MG tablet TAKE 1 TABLET (5 MG TOTAL) BY MOUTH DAILY.  Marland Kitchen apixaban (ELIQUIS) 5 MG TABS tablet Take 5 mg by mouth 2 (two) times daily.   Marland Kitchen levothyroxine (SYNTHROID, LEVOTHROID) 50 MCG tablet TAKE 1 TABLET BY MOUTH DAILY ON EMPTY STOMACH WITH GLASS OF WATER 30-60MINS BEFORE BREAKFAST  . losartan (COZAAR) 100 MG tablet TAKE 1 TABLET (100 MG TOTAL) BY MOUTH DAILY.  . pantoprazole (PROTONIX) 40 MG tablet TAKE 1 TABLET BY MOUTH TWICE A DAY  . spironolactone (ALDACTONE) 25 MG tablet TAKE 1/2 TABLET BY MOUTH EVERY DAY  . traMADol (ULTRAM) 50 MG tablet Take 1 tablet (50 mg total) by mouth at bedtime as needed.   No facility-administered encounter medications on file as of 04/20/2016.     Review of Systems  Constitutional: Negative for appetite change and unexpected weight change.  HENT: Negative for congestion and sinus pressure.   Eyes: Negative for pain and redness.  Respiratory: Negative for cough, chest tightness and shortness of breath.   Cardiovascular: Negative for chest pain, palpitations and leg swelling.  Gastrointestinal: Negative for abdominal pain, diarrhea, nausea  and vomiting.  Genitourinary: Negative for difficulty urinating and dysuria.  Musculoskeletal: Positive for back pain. Negative for joint swelling.  Skin: Negative for color change and rash.  Neurological: Negative for dizziness, light-headedness and headaches.  Hematological: Negative for adenopathy. Does not bruise/bleed easily.  Psychiatric/Behavioral: Negative for agitation and dysphoric mood.       Objective:     Blood pressure rechecked by me:  136/72  Physical Exam  Constitutional: She is oriented to person, place, and time. She appears well-developed and well-nourished. No distress.  HENT:  Nose: Nose normal.  Mouth/Throat: Oropharynx is clear and moist.  Eyes: Right eye exhibits no discharge. Left eye exhibits no discharge. No scleral icterus.  Neck: Neck supple. No thyromegaly present.    Cardiovascular: Normal rate and regular rhythm.   Pulmonary/Chest: Breath sounds normal. No accessory muscle usage. No tachypnea. No respiratory distress. She has no decreased breath sounds. She has no wheezes. She has no rhonchi. Right breast exhibits no inverted nipple, no mass, no nipple discharge and no tenderness (no axillary adenopathy). Left breast exhibits no inverted nipple, no mass, no nipple discharge and no tenderness (no axilarry adenopathy).  Abdominal: Soft. Bowel sounds are normal. There is no tenderness.  Musculoskeletal: She exhibits no edema or tenderness.  Lymphadenopathy:    She has no cervical adenopathy.  Neurological: She is alert and oriented to person, place, and time.  Skin: Skin is warm. No rash noted. No erythema.  Psychiatric: She has a normal mood and affect. Her behavior is normal.    BP 136/72   Pulse 87   Temp 98 F (36.7 C) (Oral)   Ht 5\' 3"  (1.6 m)   Wt 177 lb 6.4 oz (80.5 kg)   SpO2 97%   BMI 31.42 kg/m  Wt Readings from Last 3 Encounters:  04/20/16 177 lb 6.4 oz (80.5 kg)  10/27/15 174 lb 8 oz (79.2 kg)  09/05/15 174 lb 8 oz (79.2 kg)     Lab Results  Component Value Date   WBC 5.5 03/16/2015   HGB 13.5 03/16/2015   HCT 39.9 03/16/2015   PLT 231.0 03/16/2015   GLUCOSE 90 04/09/2016   CHOL 207 (H) 04/09/2016   TRIG 69.0 04/09/2016   HDL 69.60 04/09/2016   LDLCALC 124 (H) 04/09/2016   ALT 16 04/09/2016   AST 16 04/09/2016   NA 137 04/09/2016   K 4.3 04/09/2016   CL 100 04/09/2016   CREATININE 1.06 04/09/2016   BUN 19 04/09/2016   CO2 31 04/09/2016   TSH 3.38 03/16/2015   INR 1.04 04/19/2015   HGBA1C 5.5 04/09/2016       Assessment & Plan:   Problem List Items Addressed This Visit    Acid reflux    Controlled on protonix.        Atrial fibrillation ([place])    Has pacemaker in place.  Followed by cardiology.  Stable.  Doing well.       Back pain    Persistent.  Feels needs something for pain.  Discussed at length with  her today.  Will give her a trial of tramadol.  Follow closely.  Discussed possible side effects and risks of the medication.        Relevant Medications   traMADol (ULTRAM) 50 MG tablet   Essential hypertension, benign    Blood pressure on recheck improved.  Blood pressure has been doing well. Continue same medication regimen.  Follow pressures.  Follow metabolic panel.  Health care maintenance    Physical today 04/20/16.  Mammogram 08/05/14 - Birads I.  She declines further mammograms.        Hypercholesterolemia    Low cholesterol diet and exercise.  Follow lipid panel.       Hypothyroidism    On thyroid replacement.  Follow tsh.       Impaired vision    Seeing opthalmology.  On preservision.       Obesity (BMI 30-39.9)    Diet and exercise as tolerated.       Renal cell carcinoma ([place])    Has a renal mass.  Decided not to pursue further scanning or testing or f/u for the mass.  Discussed with her today.  She still desires no further intervention.       Sick sinus syndrome [facility])    S/p pacemaker placement.  Followed by cardiology.         Other Visit Diagnoses    Encounter for immunization       Relevant Orders   Flu vaccine HIGH DOSE PF (Completed)       Einar Pheasant, MD

## 2016-04-20 NOTE — Progress Notes (Signed)
Pre visit review using our clinic review tool, if applicable. No additional management support is needed unless otherwise documented below in the visit note. 

## 2016-04-22 ENCOUNTER — Encounter: Payer: Self-pay | Admitting: Internal Medicine

## 2016-04-22 NOTE — Assessment & Plan Note (Signed)
Has pacemaker in place.  Followed by cardiology.  Stable.  Doing well.

## 2016-04-22 NOTE — Assessment & Plan Note (Signed)
On thyroid replacement.  Follow tsh.  

## 2016-04-22 NOTE — Assessment & Plan Note (Signed)
Diet and exercise as tolerated. °

## 2016-04-22 NOTE — Assessment & Plan Note (Signed)
Blood pressure on recheck improved.  Blood pressure has been doing well. Continue same medication regimen.  Follow pressures.  Follow metabolic panel.

## 2016-04-22 NOTE — Assessment & Plan Note (Signed)
Seeing opthalmology.  On preservision.

## 2016-04-22 NOTE — Assessment & Plan Note (Signed)
Persistent.  Feels needs something for pain.  Discussed at length with her today.  Will give her a trial of tramadol.  Follow closely.  Discussed possible side effects and risks of the medication.

## 2016-04-22 NOTE — Assessment & Plan Note (Signed)
Low cholesterol diet and exercise.  Follow lipid panel.   

## 2016-04-22 NOTE — Assessment & Plan Note (Signed)
Has a renal mass.  Decided not to pursue further scanning or testing or f/u for the mass.  Discussed with her today.  She still desires no further intervention.

## 2016-04-22 NOTE — Assessment & Plan Note (Signed)
Controlled on protonix.   

## 2016-04-22 NOTE — Assessment & Plan Note (Signed)
Physical today 04/20/16.  Mammogram 08/05/14 - Birads I.  She declines further mammograms.

## 2016-04-22 NOTE — Assessment & Plan Note (Signed)
S/p pacemaker placement.  Followed by cardiology.  

## 2016-05-07 DIAGNOSIS — H353110 Nonexudative age-related macular degeneration, right eye, stage unspecified: Secondary | ICD-10-CM | POA: Diagnosis not present

## 2016-05-07 DIAGNOSIS — H3562 Retinal hemorrhage, left eye: Secondary | ICD-10-CM | POA: Diagnosis not present

## 2016-05-07 DIAGNOSIS — H402220 Chronic angle-closure glaucoma, left eye, stage unspecified: Secondary | ICD-10-CM | POA: Diagnosis not present

## 2016-05-07 DIAGNOSIS — H2511 Age-related nuclear cataract, right eye: Secondary | ICD-10-CM | POA: Diagnosis not present

## 2016-05-10 DIAGNOSIS — I2510 Atherosclerotic heart disease of native coronary artery without angina pectoris: Secondary | ICD-10-CM | POA: Diagnosis not present

## 2016-05-10 DIAGNOSIS — I517 Cardiomegaly: Secondary | ICD-10-CM | POA: Diagnosis not present

## 2016-05-10 DIAGNOSIS — Z950 Presence of cardiac pacemaker: Secondary | ICD-10-CM | POA: Diagnosis not present

## 2016-05-10 DIAGNOSIS — I495 Sick sinus syndrome: Secondary | ICD-10-CM | POA: Diagnosis not present

## 2016-05-10 DIAGNOSIS — I10 Essential (primary) hypertension: Secondary | ICD-10-CM | POA: Diagnosis not present

## 2016-05-14 ENCOUNTER — Encounter: Payer: Self-pay | Admitting: Internal Medicine

## 2016-05-18 ENCOUNTER — Other Ambulatory Visit: Payer: Self-pay | Admitting: Internal Medicine

## 2016-05-25 ENCOUNTER — Other Ambulatory Visit: Payer: Self-pay | Admitting: Internal Medicine

## 2016-07-02 ENCOUNTER — Other Ambulatory Visit: Payer: Self-pay | Admitting: Internal Medicine

## 2016-07-03 NOTE — Telephone Encounter (Signed)
Ok to refill? Last filled on 04/20/16 #30 with 1 refill.  Last OV: 04/20/16 Next OV: 08/15/16

## 2016-07-03 NOTE — Telephone Encounter (Signed)
Rx faxed by [facility]

## 2016-07-05 ENCOUNTER — Encounter: Payer: Self-pay | Admitting: Internal Medicine

## 2016-07-06 NOTE — Telephone Encounter (Signed)
Need a little more information.  Need to know why she needs the referral and where Dr Chuck Hint is located (what type of specialist).

## 2016-08-10 ENCOUNTER — Telehealth: Payer: Self-pay | Admitting: Radiology

## 2016-08-10 ENCOUNTER — Other Ambulatory Visit: Payer: Commercial Managed Care - HMO

## 2016-08-10 DIAGNOSIS — C641 Malignant neoplasm of right kidney, except renal pelvis: Secondary | ICD-10-CM

## 2016-08-10 DIAGNOSIS — E7800 Pure hypercholesterolemia, unspecified: Secondary | ICD-10-CM

## 2016-08-10 DIAGNOSIS — I482 Chronic atrial fibrillation, unspecified: Secondary | ICD-10-CM

## 2016-08-10 DIAGNOSIS — R739 Hyperglycemia, unspecified: Secondary | ICD-10-CM

## 2016-08-10 DIAGNOSIS — E038 Other specified hypothyroidism: Secondary | ICD-10-CM

## 2016-08-10 DIAGNOSIS — I10 Essential (primary) hypertension: Secondary | ICD-10-CM

## 2016-08-10 NOTE — Telephone Encounter (Signed)
Pt coming in for labs today, please place orders. Thank you.

## 2016-08-10 NOTE — Telephone Encounter (Signed)
Orders placed for labs

## 2016-08-13 ENCOUNTER — Telehealth: Payer: Self-pay | Admitting: Internal Medicine

## 2016-08-13 NOTE — Telephone Encounter (Signed)
Pt declined at this time to get AWV she said she will have son speak about it when she comes in.

## 2016-08-15 ENCOUNTER — Encounter: Payer: Self-pay | Admitting: Internal Medicine

## 2016-08-15 ENCOUNTER — Other Ambulatory Visit (INDEPENDENT_AMBULATORY_CARE_PROVIDER_SITE_OTHER): Payer: Medicare HMO

## 2016-08-15 ENCOUNTER — Ambulatory Visit (INDEPENDENT_AMBULATORY_CARE_PROVIDER_SITE_OTHER): Payer: Medicare HMO | Admitting: Internal Medicine

## 2016-08-15 DIAGNOSIS — R739 Hyperglycemia, unspecified: Secondary | ICD-10-CM | POA: Diagnosis not present

## 2016-08-15 DIAGNOSIS — I495 Sick sinus syndrome: Secondary | ICD-10-CM

## 2016-08-15 DIAGNOSIS — I482 Chronic atrial fibrillation, unspecified: Secondary | ICD-10-CM

## 2016-08-15 DIAGNOSIS — E669 Obesity, unspecified: Secondary | ICD-10-CM

## 2016-08-15 DIAGNOSIS — C641 Malignant neoplasm of right kidney, except renal pelvis: Secondary | ICD-10-CM | POA: Diagnosis not present

## 2016-08-15 DIAGNOSIS — E7800 Pure hypercholesterolemia, unspecified: Secondary | ICD-10-CM | POA: Diagnosis not present

## 2016-08-15 DIAGNOSIS — G8929 Other chronic pain: Secondary | ICD-10-CM

## 2016-08-15 DIAGNOSIS — I10 Essential (primary) hypertension: Secondary | ICD-10-CM

## 2016-08-15 DIAGNOSIS — E038 Other specified hypothyroidism: Secondary | ICD-10-CM | POA: Diagnosis not present

## 2016-08-15 DIAGNOSIS — M545 Low back pain: Secondary | ICD-10-CM

## 2016-08-15 DIAGNOSIS — K219 Gastro-esophageal reflux disease without esophagitis: Secondary | ICD-10-CM

## 2016-08-15 LAB — CBC WITH DIFFERENTIAL/PLATELET
BASOS ABS: 0 10*3/uL (ref 0.0–0.1)
Basophils Relative: 0.5 % (ref 0.0–3.0)
EOS PCT: 1.4 % (ref 0.0–5.0)
Eosinophils Absolute: 0.1 10*3/uL (ref 0.0–0.7)
HCT: 39.7 % (ref 36.0–46.0)
HEMOGLOBIN: 13.6 g/dL (ref 12.0–15.0)
Lymphocytes Relative: 20.8 % (ref 12.0–46.0)
Lymphs Abs: 1.4 10*3/uL (ref 0.7–4.0)
MCHC: 34.4 g/dL (ref 30.0–36.0)
MCV: 87.2 fl (ref 78.0–100.0)
MONOS PCT: 7.8 % (ref 3.0–12.0)
Monocytes Absolute: 0.5 10*3/uL (ref 0.1–1.0)
NEUTROS PCT: 69.5 % (ref 43.0–77.0)
Neutro Abs: 4.7 10*3/uL (ref 1.4–7.7)
Platelets: 223 10*3/uL (ref 150.0–400.0)
RBC: 4.55 Mil/uL (ref 3.87–5.11)
RDW: 12.8 % (ref 11.5–15.5)
WBC: 6.7 10*3/uL (ref 4.0–10.5)

## 2016-08-15 LAB — BASIC METABOLIC PANEL
BUN: 17 mg/dL (ref 6–23)
CALCIUM: 9.7 mg/dL (ref 8.4–10.5)
CO2: 30 mEq/L (ref 19–32)
CREATININE: 1.08 mg/dL (ref 0.40–1.20)
Chloride: 99 mEq/L (ref 96–112)
GFR: 50.56 mL/min — AB (ref >60.00)
Glucose, Bld: 89 mg/dL (ref 70–99)
Potassium: 4.2 mEq/L (ref 3.5–5.1)
SODIUM: 137 meq/L (ref 135–145)

## 2016-08-15 LAB — TSH: TSH: 2.25 u[IU]/mL (ref 0.35–4.50)

## 2016-08-15 LAB — LIPID PANEL
CHOL/HDL RATIO: 3
Cholesterol: 210 mg/dL — ABNORMAL HIGH (ref 0–200)
HDL: 74.3 mg/dL (ref >39.00)
LDL CALC: 120 mg/dL — AB (ref 0–99)
NonHDL: 135.82
Triglycerides: 81 mg/dL (ref 0.0–149.0)
VLDL: 16.2 mg/dL (ref 0.0–40.0)

## 2016-08-15 LAB — HEPATIC FUNCTION PANEL
ALBUMIN: 4.6 g/dL (ref 3.5–5.2)
ALK PHOS: 55 U/L (ref 39–117)
ALT: 14 U/L (ref 0–35)
AST: 17 U/L (ref 0–37)
BILIRUBIN DIRECT: 0.2 mg/dL (ref 0.0–0.3)
TOTAL PROTEIN: 7 g/dL (ref 6.0–8.3)
Total Bilirubin: 1.1 mg/dL (ref 0.2–1.2)

## 2016-08-15 LAB — HEMOGLOBIN A1C: HEMOGLOBIN A1C: 5.6 % (ref 4.6–6.5)

## 2016-08-15 NOTE — Progress Notes (Signed)
Patient ID: Heidi Baldwin, female   DOB: 13-May-1924, 83 y.o.   MRN: [id number]   Subjective:    Patient ID: Heidi Baldwin, female    DOB: 1923-11-18, 83 y.o.   MRN: [id number]  HPI  Patient here for a scheduled follow up.  She is still having increased pain.  Taking tramadol.  Not sure if helps.  Desires no further medication or w/up.  Eating.  No nausea or vomiting.  Bowels stable.  No abdominal pain.  No chest pain.  No sob.  Sees cardiology.  Stable.     Past Medical History:  Diagnosis Date  . Acid reflux   . Allergy   . Arthritis   . Atrial fibrillation ([place])   . Heart disease   . Heart murmur   . Hx of actinic keratosis May 2015   under left breast  . Hypercholesterolemia   . Hypertension   . Hypothyroidism    Past Surgical History:  Procedure Laterality Date  . ABDOMINAL HYSTERECTOMY    . CHOLECYSTECTOMY    . CORNEAL TRANSPLANT     Right x 3 Left x1  . CYST REMOVAL TRUNK     Tail bone  . KNEE SURGERY Left   . PACEMAKER INSERTION  05/03/13   Family History  Problem Relation Age of Onset  . Diabetes Mother   . Heart disease Father   . Hyperlipidemia Father   . Heart disease Sister   . Arthritis Son    Social History   Social History  . Marital status: Widowed    Spouse name: N/A  . Number of children: N/A  . Years of education: N/A   Social History Main Topics  . Smoking status: Never Smoker  . Smokeless tobacco: Never Used  . Alcohol use No  . Drug use: No  . Sexual activity: Not Asked   Other Topics Concern  . None   Social History Narrative  . None    Outpatient Encounter Prescriptions as of 08/15/2016  Medication Sig  . amLODipine (NORVASC) 5 MG tablet TAKE 1 TABLET (5 MG TOTAL) BY MOUTH DAILY.  Marland Kitchen amLODipine (NORVASC) 5 MG tablet TAKE 1 TABLET (5 MG TOTAL) BY MOUTH DAILY.  Marland Kitchen apixaban (ELIQUIS) 5 MG TABS tablet Take 5 mg by mouth 2 (two) times daily.   Marland Kitchen levothyroxine (SYNTHROID, LEVOTHROID) 50 MCG tablet TAKE 1 TABLET BY MOUTH DAILY ON EMPTY  STOMACH WITH GLASS OF WATER 30-60MINS BEFORE BREAKFAST  . losartan (COZAAR) 100 MG tablet TAKE 1 TABLET (100 MG TOTAL) BY MOUTH DAILY.  . pantoprazole (PROTONIX) 40 MG tablet TAKE 1 TABLET BY MOUTH TWICE A DAY  . spironolactone (ALDACTONE) 25 MG tablet TAKE 1/2 TABLET BY MOUTH EVERY DAY  . traMADol (ULTRAM) 50 MG tablet TAKE 1 TABLET BY MOUTH AT BEDTIME AS NEEDED   No facility-administered encounter medications on file as of 08/15/2016.     Review of Systems  Constitutional: Negative for appetite change and unexpected weight change.  HENT: Negative for congestion and sinus pressure.   Respiratory: Negative for cough, chest tightness and shortness of breath.   Cardiovascular: Negative for chest pain, palpitations and leg swelling.  Gastrointestinal: Negative for abdominal pain, diarrhea, nausea and vomiting.  Genitourinary: Negative for difficulty urinating and dysuria.  Musculoskeletal: Positive for back pain. Negative for joint swelling.  Skin: Negative for color change and rash.  Neurological: Negative for dizziness, light-headedness and headaches.  Psychiatric/Behavioral: Negative for agitation and dysphoric mood.  Objective:    Physical Exam  Constitutional: She appears well-developed and well-nourished. No distress.  HENT:  Nose: Nose normal.  Mouth/Throat: Oropharynx is clear and moist.  Neck: Neck supple. No thyromegaly present.  Cardiovascular: Normal rate and regular rhythm.   Pulmonary/Chest: Breath sounds normal. No respiratory distress. She has no wheezes.  Abdominal: Soft. Bowel sounds are normal. There is no tenderness.  Musculoskeletal: She exhibits no edema or tenderness.  Lymphadenopathy:    She has no cervical adenopathy.  Skin: No rash noted. No erythema.  Psychiatric: She has a normal mood and affect. Her behavior is normal.    BP (!) 146/68   Pulse 76   Temp 97.7 F (36.5 C) (Oral)   Resp 16   Ht 5\' 3"  (1.6 m)   Wt 172 lb (78 kg)   SpO2 96%    BMI 30.47 kg/m  Wt Readings from Last 3 Encounters:  08/15/16 172 lb (78 kg)  04/20/16 177 lb 6.4 oz (80.5 kg)  10/27/15 174 lb 8 oz (79.2 kg)     Lab Results  Component Value Date   WBC 6.7 08/15/2016   HGB 13.6 08/15/2016   HCT 39.7 08/15/2016   PLT 223.0 08/15/2016   GLUCOSE 89 08/15/2016   CHOL 210 (H) 08/15/2016   TRIG 81.0 08/15/2016   HDL 74.30 08/15/2016   LDLCALC 120 (H) 08/15/2016   ALT 14 08/15/2016   AST 17 08/15/2016   NA 137 08/15/2016   K 4.2 08/15/2016   CL 99 08/15/2016   CREATININE 1.08 08/15/2016   BUN 17 08/15/2016   CO2 30 08/15/2016   TSH 2.25 08/15/2016   INR 1.04 04/19/2015   HGBA1C 5.6 08/15/2016       Assessment & Plan:   Problem List Items Addressed This Visit    Acid reflux    Controlled on protonix.        Atrial fibrillation ([place])    Has pacemaker in place.  Stable.  Follow.       Back pain    Persistent.  Declines any further xray or scanning.  Has tramadol.  Follow.       Essential hypertension, benign    Blood pressure on recheck improved.  Follow.  Same medication regimen.        Hypercholesterolemia    Low cholesterol diet and exercise.  Follow lipid panel.        Hypothyroidism    On thyroid replacement.  Follow tsh.        Obesity (BMI 30-39.9)    Diet and exercise.        Renal cell carcinoma ([place])    Has a renal mass.  Discussed with her today.  Declines further w/up or evaluation.        Sick sinus syndrome [facility])    S/p pacemaker placement.  Followed by cardiology.            Einar Pheasant, MD

## 2016-08-15 NOTE — Progress Notes (Signed)
Pre-visit discussion using our clinic review tool. No additional management support is needed unless otherwise documented below in the visit note.  

## 2016-08-19 ENCOUNTER — Encounter: Payer: Self-pay | Admitting: Internal Medicine

## 2016-08-19 NOTE — Assessment & Plan Note (Signed)
Has a renal mass.  Discussed with her today.  Declines further w/up or evaluation.

## 2016-08-19 NOTE — Assessment & Plan Note (Signed)
On thyroid replacement.  Follow tsh.  

## 2016-08-19 NOTE — Assessment & Plan Note (Signed)
Persistent.  Declines any further xray or scanning.  Has tramadol.  Follow.

## 2016-08-19 NOTE — Assessment & Plan Note (Signed)
S/p pacemaker placement.  Followed by cardiology.  

## 2016-08-19 NOTE — Assessment & Plan Note (Signed)
Controlled on protonix.   

## 2016-08-19 NOTE — Assessment & Plan Note (Signed)
Blood pressure on recheck improved.  Follow.  Same medication regimen.   

## 2016-08-19 NOTE — Assessment & Plan Note (Signed)
Diet and exercise.   

## 2016-08-19 NOTE — Assessment & Plan Note (Signed)
Low cholesterol diet and exercise.  Follow lipid panel.   

## 2016-08-19 NOTE — Assessment & Plan Note (Signed)
Has pacemaker in place.  Stable.  Follow.

## 2016-09-03 DIAGNOSIS — H353110 Nonexudative age-related macular degeneration, right eye, stage unspecified: Secondary | ICD-10-CM | POA: Diagnosis not present

## 2016-09-03 DIAGNOSIS — H2511 Age-related nuclear cataract, right eye: Secondary | ICD-10-CM | POA: Diagnosis not present

## 2016-09-03 DIAGNOSIS — H402232 Chronic angle-closure glaucoma, bilateral, moderate stage: Secondary | ICD-10-CM | POA: Diagnosis not present

## 2016-09-03 DIAGNOSIS — H3562 Retinal hemorrhage, left eye: Secondary | ICD-10-CM | POA: Diagnosis not present

## 2016-09-13 ENCOUNTER — Other Ambulatory Visit: Payer: Self-pay | Admitting: Internal Medicine

## 2016-09-13 NOTE — Telephone Encounter (Signed)
Last refill on tramadol was on 08/16/16, last OV was on 08/15/16, ok to refill?

## 2016-09-14 NOTE — Telephone Encounter (Signed)
Called into pharmacy

## 2016-10-01 ENCOUNTER — Other Ambulatory Visit: Payer: Self-pay | Admitting: Internal Medicine

## 2016-10-08 ENCOUNTER — Telehealth: Payer: Self-pay | Admitting: Internal Medicine

## 2016-10-08 NOTE — Telephone Encounter (Signed)
Pt declined AWV. °

## 2016-10-12 ENCOUNTER — Other Ambulatory Visit: Payer: Self-pay | Admitting: Internal Medicine

## 2016-10-15 NOTE — Telephone Encounter (Signed)
Last OV 08/15/16 and last fill on tramadol 09/13/16 ok to refill?

## 2016-10-16 ENCOUNTER — Other Ambulatory Visit: Payer: Self-pay | Admitting: Internal Medicine

## 2016-10-16 NOTE — Telephone Encounter (Signed)
ok'd rx for tramadol #30 with one refill.

## 2016-10-17 NOTE — Telephone Encounter (Addendum)
Last refill 09/14/16 Last office visit 1.24/18 Next office visit 12/18/16 Script already sent 10/16/16

## 2016-10-20 ENCOUNTER — Other Ambulatory Visit: Payer: Self-pay | Admitting: Internal Medicine

## 2016-10-25 DIAGNOSIS — I2510 Atherosclerotic heart disease of native coronary artery without angina pectoris: Secondary | ICD-10-CM | POA: Diagnosis not present

## 2016-10-25 DIAGNOSIS — I495 Sick sinus syndrome: Secondary | ICD-10-CM | POA: Diagnosis not present

## 2016-10-25 DIAGNOSIS — I482 Chronic atrial fibrillation: Secondary | ICD-10-CM | POA: Diagnosis not present

## 2016-10-25 DIAGNOSIS — R0602 Shortness of breath: Secondary | ICD-10-CM | POA: Diagnosis not present

## 2016-11-01 ENCOUNTER — Telehealth: Payer: Self-pay | Admitting: Internal Medicine

## 2016-11-01 DIAGNOSIS — R739 Hyperglycemia, unspecified: Secondary | ICD-10-CM

## 2016-11-01 DIAGNOSIS — C641 Malignant neoplasm of right kidney, except renal pelvis: Secondary | ICD-10-CM

## 2016-11-01 DIAGNOSIS — E038 Other specified hypothyroidism: Secondary | ICD-10-CM

## 2016-11-01 DIAGNOSIS — E7800 Pure hypercholesterolemia, unspecified: Secondary | ICD-10-CM

## 2016-11-01 DIAGNOSIS — I10 Essential (primary) hypertension: Secondary | ICD-10-CM

## 2016-11-01 NOTE — Telephone Encounter (Signed)
Pt son called and wanted to see if Dr. Nicki Reaper wanted pt to have labs done before her next visit on 5/29. Please advise, thank you!

## 2016-11-01 NOTE — Telephone Encounter (Signed)
Please advise 

## 2016-11-02 NOTE — Telephone Encounter (Signed)
Patient scheduled for labs

## 2016-11-02 NOTE — Telephone Encounter (Signed)
Orders placed for labs.  Please schedule fasting lab appt prior to her 12/18/16 appt.

## 2016-12-10 ENCOUNTER — Other Ambulatory Visit: Payer: Self-pay | Admitting: Internal Medicine

## 2016-12-14 ENCOUNTER — Other Ambulatory Visit (INDEPENDENT_AMBULATORY_CARE_PROVIDER_SITE_OTHER): Payer: Medicare HMO

## 2016-12-14 DIAGNOSIS — E7800 Pure hypercholesterolemia, unspecified: Secondary | ICD-10-CM | POA: Diagnosis not present

## 2016-12-14 DIAGNOSIS — I10 Essential (primary) hypertension: Secondary | ICD-10-CM

## 2016-12-14 DIAGNOSIS — R739 Hyperglycemia, unspecified: Secondary | ICD-10-CM

## 2016-12-14 LAB — HEPATIC FUNCTION PANEL
ALBUMIN: 4.4 g/dL (ref 3.5–5.2)
ALT: 13 U/L (ref 0–35)
AST: 15 U/L (ref 0–37)
Alkaline Phosphatase: 52 U/L (ref 39–117)
Bilirubin, Direct: 0.2 mg/dL (ref 0.0–0.3)
Total Bilirubin: 0.8 mg/dL (ref 0.2–1.2)
Total Protein: 6.8 g/dL (ref 6.0–8.3)

## 2016-12-14 LAB — LIPID PANEL
Cholesterol: 205 mg/dL — ABNORMAL HIGH (ref 0–200)
HDL: 75.3 mg/dL (ref >39.00)
LDL CALC: 119 mg/dL — AB (ref 0–99)
NonHDL: 130.06
Total CHOL/HDL Ratio: 3
Triglycerides: 55 mg/dL (ref 0.0–149.0)
VLDL: 11 mg/dL (ref 0.0–40.0)

## 2016-12-14 LAB — BASIC METABOLIC PANEL
BUN: 15 mg/dL (ref 6–23)
CO2: 30 meq/L (ref 19–32)
Calcium: 9.6 mg/dL (ref 8.4–10.5)
Chloride: 100 mEq/L (ref 96–112)
Creatinine, Ser: 1.01 mg/dL (ref 0.40–1.20)
GFR: 54.58 mL/min — AB (ref >60.00)
GLUCOSE: 95 mg/dL (ref 70–99)
POTASSIUM: 4.5 meq/L (ref 3.5–5.1)
SODIUM: 136 meq/L (ref 135–145)

## 2016-12-14 LAB — HEMOGLOBIN A1C: Hgb A1c MFr Bld: 5.6 % (ref 4.6–6.5)

## 2016-12-15 ENCOUNTER — Encounter: Payer: Self-pay | Admitting: Internal Medicine

## 2016-12-18 ENCOUNTER — Ambulatory Visit (INDEPENDENT_AMBULATORY_CARE_PROVIDER_SITE_OTHER): Payer: Medicare HMO | Admitting: Internal Medicine

## 2016-12-18 ENCOUNTER — Encounter: Payer: Self-pay | Admitting: Internal Medicine

## 2016-12-18 VITALS — BP 140/68 | HR 80 | Temp 98.4°F | Ht 63.0 in | Wt 169.2 lb

## 2016-12-18 DIAGNOSIS — R739 Hyperglycemia, unspecified: Secondary | ICD-10-CM

## 2016-12-18 DIAGNOSIS — I272 Pulmonary hypertension, unspecified: Secondary | ICD-10-CM

## 2016-12-18 DIAGNOSIS — I2510 Atherosclerotic heart disease of native coronary artery without angina pectoris: Secondary | ICD-10-CM

## 2016-12-18 DIAGNOSIS — I482 Chronic atrial fibrillation, unspecified: Secondary | ICD-10-CM

## 2016-12-18 DIAGNOSIS — K219 Gastro-esophageal reflux disease without esophagitis: Secondary | ICD-10-CM | POA: Diagnosis not present

## 2016-12-18 DIAGNOSIS — I10 Essential (primary) hypertension: Secondary | ICD-10-CM

## 2016-12-18 DIAGNOSIS — G8929 Other chronic pain: Secondary | ICD-10-CM | POA: Diagnosis not present

## 2016-12-18 DIAGNOSIS — I495 Sick sinus syndrome: Secondary | ICD-10-CM

## 2016-12-18 DIAGNOSIS — E038 Other specified hypothyroidism: Secondary | ICD-10-CM

## 2016-12-18 DIAGNOSIS — M545 Low back pain: Secondary | ICD-10-CM

## 2016-12-18 DIAGNOSIS — E7800 Pure hypercholesterolemia, unspecified: Secondary | ICD-10-CM

## 2016-12-18 DIAGNOSIS — C641 Malignant neoplasm of right kidney, except renal pelvis: Secondary | ICD-10-CM

## 2016-12-18 MED ORDER — TRAMADOL HCL 50 MG PO TABS: 50.0000 mg | ORAL_TABLET | Freq: Two times a day (BID) | ORAL | 1 refills | Status: DC | PRN

## 2016-12-18 NOTE — Assessment & Plan Note (Signed)
S/p pacemaker placement.  Followed by cardiology.  

## 2016-12-18 NOTE — Assessment & Plan Note (Signed)
Has pacemaker in place.  Stable.  Followed by cardiology.   

## 2016-12-18 NOTE — Assessment & Plan Note (Signed)
Followed by cardiology. Planning for echo next month.

## 2016-12-18 NOTE — Progress Notes (Signed)
Patient ID: Heidi Baldwin, female   DOB: Nov 12, 1923, 83 y.o.   MRN: [id number]   Subjective:    Patient ID: Heidi Baldwin, female    DOB: 1923/12/09, 83 y.o.   MRN: [id number]  HPI  Patient here for a scheduled follow up.  She reports she is doing relatively well.  Her main complaint is that of back pain.  Persistent.  See previous notes.  On tramadol.  Intolerant to other pain medications.  Tolerates tramadol.  Feels helps.  Wants to increase to bid dosing.  Has only been taking at night.  Eating.  States does not eat as much.  No nausea or vomiting.  No abdominal pain.  No acid reflux.  No chest pain or tightness.  No sob.  Bowels stable.  Declines further evaluation or referral for her back pain.     Past Medical History:  Diagnosis Date  . Acid reflux   . Allergy   . Arthritis   . Atrial fibrillation ([place])   . Heart disease   . Heart murmur   . Hx of actinic keratosis May 2015   under left breast  . Hypercholesterolemia   . Hypertension   . Hypothyroidism    Past Surgical History:  Procedure Laterality Date  . ABDOMINAL HYSTERECTOMY    . CHOLECYSTECTOMY    . CORNEAL TRANSPLANT     Right x 3 Left x1  . CYST REMOVAL TRUNK     Tail bone  . KNEE SURGERY Left   . PACEMAKER INSERTION  05/03/13   Family History  Problem Relation Age of Onset  . Diabetes Mother   . Heart disease Father   . Hyperlipidemia Father   . Heart disease Sister   . Arthritis Son    Social History   Social History  . Marital status: Widowed    Spouse name: N/A  . Number of children: N/A  . Years of education: N/A   Social History Main Topics  . Smoking status: Never Smoker  . Smokeless tobacco: Never Used  . Alcohol use No  . Drug use: No  . Sexual activity: Not Asked   Other Topics Concern  . None   Social History Narrative  . None    Outpatient Encounter Prescriptions as of 12/18/2016  Medication Sig  . amLODipine (NORVASC) 5 MG tablet TAKE 1 TABLET (5 MG TOTAL) BY MOUTH DAILY.  Marland Kitchen  apixaban (ELIQUIS) 5 MG TABS tablet Take 5 mg by mouth 2 (two) times daily.   Marland Kitchen levothyroxine (SYNTHROID, LEVOTHROID) 50 MCG tablet TAKE 1 TABLET BY MOUTH DAILY ON EMPTY STOMACH WITH GLASS OF WATER 30-60MINS BEFORE BREAKFAST  . losartan (COZAAR) 100 MG tablet TAKE 1 TABLET (100 MG TOTAL) BY MOUTH DAILY.  . pantoprazole (PROTONIX) 40 MG tablet TAKE 1 TABLET BY MOUTH TWICE A DAY  . spironolactone (ALDACTONE) 25 MG tablet TAKE 1/2 TABLET BY MOUTH EVERY DAY  . traMADol (ULTRAM) 50 MG tablet Take 1 tablet (50 mg total) by mouth 2 (two) times daily as needed.  . [DISCONTINUED] amLODipine (NORVASC) 5 MG tablet TAKE 1 TABLET (5 MG TOTAL) BY MOUTH DAILY.  . [DISCONTINUED] traMADol (ULTRAM) 50 MG tablet TAKE 1 TABLET BY MOUTH AT BEDTIME AS NEEDED   No facility-administered encounter medications on file as of 12/18/2016.     Review of Systems  Constitutional:       Eating.  Decreased weight.    HENT: Negative for congestion and sinus pressure.   Respiratory: Negative for cough,  chest tightness and shortness of breath.   Cardiovascular: Negative for chest pain, palpitations and leg swelling.  Gastrointestinal: Negative for abdominal pain, diarrhea, nausea and vomiting.  Genitourinary: Negative for difficulty urinating and dysuria.  Musculoskeletal: Positive for back pain. Negative for joint swelling.  Skin: Negative for color change and rash.  Neurological: Negative for dizziness, light-headedness and headaches.  Psychiatric/Behavioral: Negative for agitation and dysphoric mood.       Objective:    Physical Exam  Constitutional: She appears well-developed and well-nourished. No distress.  HENT:  Nose: Nose normal.  Mouth/Throat: Oropharynx is clear and moist.  Neck: Neck supple. No thyromegaly present.  Cardiovascular: Normal rate and regular rhythm.   Pulmonary/Chest: Breath sounds normal. No respiratory distress. She has no wheezes.  Abdominal: Soft. Bowel sounds are normal. There is no  tenderness.  Musculoskeletal: She exhibits no edema or tenderness.  Lymphadenopathy:    She has no cervical adenopathy.  Skin: No rash noted. No erythema.  Psychiatric: She has a normal mood and affect. Her behavior is normal.    BP 140/68 (BP Location: Left Arm, Patient Position: Sitting, Cuff Size: Normal)   Pulse 80   Temp 98.4 F (36.9 C) (Oral)   Ht 5\' 3"  (1.6 m)   Wt 169 lb 3.2 oz (76.7 kg)   SpO2 95%   BMI 29.97 kg/m  Wt Readings from Last 3 Encounters:  12/18/16 169 lb 3.2 oz (76.7 kg)  08/15/16 172 lb (78 kg)  04/20/16 177 lb 6.4 oz (80.5 kg)     Lab Results  Component Value Date   WBC 6.7 08/15/2016   HGB 13.6 08/15/2016   HCT 39.7 08/15/2016   PLT 223.0 08/15/2016   GLUCOSE 95 12/14/2016   CHOL 205 (H) 12/14/2016   TRIG 55.0 12/14/2016   HDL 75.30 12/14/2016   LDLCALC 119 (H) 12/14/2016   ALT 13 12/14/2016   AST 15 12/14/2016   NA 136 12/14/2016   K 4.5 12/14/2016   CL 100 12/14/2016   CREATININE 1.01 12/14/2016   BUN 15 12/14/2016   CO2 30 12/14/2016   TSH 2.25 08/15/2016   INR 1.04 04/19/2015   HGBA1C 5.6 12/14/2016       Assessment & Plan:   Problem List Items Addressed This Visit    Acid reflux    Controlled on protonix.       Arteriosclerosis of coronary artery    Known CAD as outlined.  Followed by cardiology.  Continue risk factor modification.        Atrial fibrillation ([place])    Has pacemaker in place.  Stable.  Followed by cardiology.        Back pain    Persistent as outlined.  Declines further xrays or evaluation or referral.  Wants to increase tramadol to bid.  Discussed possible side effects of medication.       Relevant Medications   traMADol (ULTRAM) 50 MG tablet   Essential hypertension, benign    Blood pressure has been under reasonable control.  Same medication regimen.  Follow pressures.  Follow metabolic panel.        Relevant Orders   Basic metabolic panel   Hypercholesterolemia    Low cholesterol diet and  exercise.  Follow lipid panel.  Discussed lab results with her.        Relevant Orders   Hepatic function panel   Lipid panel   Hypothyroidism    On thyroid replacement.  Follow tsh.  Pulmonary hypertension ([place])    Followed by cardiology. Planning for echo next month.        Renal cell carcinoma ([place])    Has a renal mass.  Have discussed with her at length and discussed with her today regarding further evaluation and treatment.  She declines.  Follow.        Sick sinus syndrome [facility])    S/p pacemaker placement.  Followed by cardiology.         Other Visit Diagnoses    Hyperglycemia    -  Primary   Relevant Orders   Hemoglobin A1c       Einar Pheasant, MD

## 2016-12-18 NOTE — Assessment & Plan Note (Addendum)
Low cholesterol diet and exercise.  Follow lipid panel.  Discussed lab results with her.

## 2016-12-18 NOTE — Progress Notes (Signed)
Pre-visit discussion using our clinic review tool. No additional management support is needed unless otherwise documented below in the visit note.  

## 2016-12-18 NOTE — Assessment & Plan Note (Signed)
On thyroid replacement.  Follow tsh.  

## 2016-12-18 NOTE — Assessment & Plan Note (Signed)
Persistent as outlined.  Declines further xrays or evaluation or referral.  Wants to increase tramadol to bid.  Discussed possible side effects of medication.

## 2016-12-18 NOTE — Assessment & Plan Note (Signed)
Known CAD as outlined.  Followed by cardiology.  Continue risk factor modification.

## 2016-12-18 NOTE — Assessment & Plan Note (Signed)
Has a renal mass.  Have discussed with her at length and discussed with her today regarding further evaluation and treatment.  She declines.  Follow.

## 2016-12-18 NOTE — Assessment & Plan Note (Signed)
Controlled on protonix.   

## 2016-12-18 NOTE — Assessment & Plan Note (Signed)
Blood pressure has been under reasonable control.  Same medication regimen.  Follow pressures.  Follow metabolic panel.   

## 2016-12-31 DIAGNOSIS — I10 Essential (primary) hypertension: Secondary | ICD-10-CM | POA: Diagnosis not present

## 2016-12-31 DIAGNOSIS — I517 Cardiomegaly: Secondary | ICD-10-CM | POA: Diagnosis not present

## 2016-12-31 DIAGNOSIS — I340 Nonrheumatic mitral (valve) insufficiency: Secondary | ICD-10-CM | POA: Diagnosis not present

## 2016-12-31 DIAGNOSIS — I482 Chronic atrial fibrillation: Secondary | ICD-10-CM | POA: Diagnosis not present

## 2016-12-31 DIAGNOSIS — R0602 Shortness of breath: Secondary | ICD-10-CM | POA: Diagnosis not present

## 2016-12-31 DIAGNOSIS — I495 Sick sinus syndrome: Secondary | ICD-10-CM | POA: Diagnosis not present

## 2017-01-16 DIAGNOSIS — M25561 Pain in right knee: Secondary | ICD-10-CM | POA: Diagnosis not present

## 2017-01-16 DIAGNOSIS — M1711 Unilateral primary osteoarthritis, right knee: Secondary | ICD-10-CM | POA: Diagnosis not present

## 2017-01-30 ENCOUNTER — Telehealth: Payer: Self-pay | Admitting: Internal Medicine

## 2017-01-30 NOTE — Telephone Encounter (Signed)
LVM for pt to call back: Can pt come in at 9am for AWV and labs instead of just 830am for labs only?   Ebony Hail call back [phone]

## 2017-03-01 NOTE — Telephone Encounter (Signed)
Pt declined/appt cancelled

## 2017-03-01 NOTE — Telephone Encounter (Signed)
LVM for pt to call back: Can pt come in at 9am on 04/23/17 for AWV and labs instead of just 830 for labs only?  Appt with Denisa scheduled to hold slot

## 2017-03-05 ENCOUNTER — Other Ambulatory Visit: Payer: Self-pay | Admitting: Internal Medicine

## 2017-03-07 NOTE — Telephone Encounter (Signed)
Last OV: 12/18/16 Next OV: 04/25/17 Last refill: 12/18/16

## 2017-03-08 NOTE — Telephone Encounter (Signed)
[facility] to [facility]

## 2017-03-10 ENCOUNTER — Other Ambulatory Visit: Payer: Self-pay | Admitting: Internal Medicine

## 2017-04-17 ENCOUNTER — Other Ambulatory Visit: Payer: Self-pay | Admitting: Internal Medicine

## 2017-04-23 ENCOUNTER — Other Ambulatory Visit (INDEPENDENT_AMBULATORY_CARE_PROVIDER_SITE_OTHER): Payer: Medicare HMO

## 2017-04-23 ENCOUNTER — Ambulatory Visit: Payer: Medicare HMO

## 2017-04-23 DIAGNOSIS — I10 Essential (primary) hypertension: Secondary | ICD-10-CM

## 2017-04-23 DIAGNOSIS — E7800 Pure hypercholesterolemia, unspecified: Secondary | ICD-10-CM | POA: Diagnosis not present

## 2017-04-23 DIAGNOSIS — R739 Hyperglycemia, unspecified: Secondary | ICD-10-CM | POA: Diagnosis not present

## 2017-04-23 LAB — BASIC METABOLIC PANEL
BUN: 16 mg/dL (ref 6–23)
CHLORIDE: 100 meq/L (ref 96–112)
CO2: 30 mEq/L (ref 19–32)
CREATININE: 1.13 mg/dL (ref 0.40–1.20)
Calcium: 9.8 mg/dL (ref 8.4–10.5)
GFR: 47.91 mL/min — ABNORMAL LOW (ref >60.00)
GLUCOSE: 89 mg/dL (ref 70–99)
POTASSIUM: 4.4 meq/L (ref 3.5–5.1)
Sodium: 137 mEq/L (ref 135–145)

## 2017-04-23 LAB — LIPID PANEL
Cholesterol: 212 mg/dL — ABNORMAL HIGH (ref 0–200)
HDL: 69.5 mg/dL (ref >39.00)
LDL Cholesterol: 127 mg/dL — ABNORMAL HIGH (ref 0–99)
NonHDL: 142.12
TRIGLYCERIDES: 75 mg/dL (ref 0.0–149.0)
Total CHOL/HDL Ratio: 3
VLDL: 15 mg/dL (ref 0.0–40.0)

## 2017-04-23 LAB — HEPATIC FUNCTION PANEL
ALK PHOS: 52 U/L (ref 39–117)
ALT: 12 U/L (ref 0–35)
AST: 15 U/L (ref 0–37)
Albumin: 4.4 g/dL (ref 3.5–5.2)
BILIRUBIN DIRECT: 0.2 mg/dL (ref 0.0–0.3)
BILIRUBIN TOTAL: 1.1 mg/dL (ref 0.2–1.2)
Total Protein: 6.8 g/dL (ref 6.0–8.3)

## 2017-04-23 LAB — HEMOGLOBIN A1C: Hgb A1c MFr Bld: 5.6 % (ref 4.6–6.5)

## 2017-04-25 ENCOUNTER — Ambulatory Visit (INDEPENDENT_AMBULATORY_CARE_PROVIDER_SITE_OTHER): Payer: Medicare HMO | Admitting: Internal Medicine

## 2017-04-25 ENCOUNTER — Encounter: Payer: Self-pay | Admitting: Internal Medicine

## 2017-04-25 VITALS — BP 148/80 | HR 92 | Temp 97.9°F | Resp 20 | Ht 62.99 in | Wt 163.8 lb

## 2017-04-25 DIAGNOSIS — G8929 Other chronic pain: Secondary | ICD-10-CM | POA: Diagnosis not present

## 2017-04-25 DIAGNOSIS — I482 Chronic atrial fibrillation, unspecified: Secondary | ICD-10-CM

## 2017-04-25 DIAGNOSIS — E038 Other specified hypothyroidism: Secondary | ICD-10-CM

## 2017-04-25 DIAGNOSIS — I495 Sick sinus syndrome: Secondary | ICD-10-CM

## 2017-04-25 DIAGNOSIS — K219 Gastro-esophageal reflux disease without esophagitis: Secondary | ICD-10-CM | POA: Diagnosis not present

## 2017-04-25 DIAGNOSIS — C641 Malignant neoplasm of right kidney, except renal pelvis: Secondary | ICD-10-CM

## 2017-04-25 DIAGNOSIS — M545 Low back pain: Secondary | ICD-10-CM

## 2017-04-25 DIAGNOSIS — I272 Pulmonary hypertension, unspecified: Secondary | ICD-10-CM | POA: Diagnosis not present

## 2017-04-25 DIAGNOSIS — Z0000 Encounter for general adult medical examination without abnormal findings: Secondary | ICD-10-CM | POA: Diagnosis not present

## 2017-04-25 DIAGNOSIS — E7800 Pure hypercholesterolemia, unspecified: Secondary | ICD-10-CM | POA: Diagnosis not present

## 2017-04-25 DIAGNOSIS — I10 Essential (primary) hypertension: Secondary | ICD-10-CM

## 2017-04-25 NOTE — Assessment & Plan Note (Signed)
Physical today 04/25/17.  She declines further mammogram.

## 2017-04-25 NOTE — Progress Notes (Signed)
Patient ID: Heidi Baldwin, female   DOB: [birth year], 83 y.o.   MRN: [id number]   Subjective:    Patient ID: Heidi Baldwin, female    DOB: 24-Nov-1923, 83 y.o.   MRN: [id number]  HPI  Patient here for her physical exam.  She reports things are relatively stable.  She recently saw Dr Nehemiah Massed.  Stable.  No changes made.  No chest pain.  No sob.  No acid reflux.  No nausea or vomiting.  Weight loss.  States she does not eat as much, but she does eat.  Has been more active and feels this is contributing some to weight loss.  Some minimal constipation.  miralax controls.  Persistent back pain.  Saw ortho.  S/p injection.  Helped.  S/p right knee injection 12/2016.  Helped some.     Past Medical History:  Diagnosis Date  . Acid reflux   . Allergy   . Arthritis   . Atrial fibrillation ([place])   . Heart disease   . Heart murmur   . Hx of actinic keratosis May 2015   under left breast  . Hypercholesterolemia   . Hypertension   . Hypothyroidism    Past Surgical History:  Procedure Laterality Date  . ABDOMINAL HYSTERECTOMY    . CHOLECYSTECTOMY    . CORNEAL TRANSPLANT     Right x 3 Left x1  . CYST REMOVAL TRUNK     Tail bone  . KNEE SURGERY Left   . PACEMAKER INSERTION  05/03/13   Family History  Problem Relation Age of Onset  . Diabetes Mother   . Heart disease Father   . Hyperlipidemia Father   . Heart disease Sister   . Arthritis Son    Social History   Social History  . Marital status: Widowed    Spouse name: N/A  . Number of children: N/A  . Years of education: N/A   Social History Main Topics  . Smoking status: Never Smoker  . Smokeless tobacco: Never Used  . Alcohol use No  . Drug use: No  . Sexual activity: Not Asked   Other Topics Concern  . None   Social History Narrative  . None    Outpatient Encounter Prescriptions as of 04/25/2017  Medication Sig  . amLODipine (NORVASC) 5 MG tablet TAKE 1 TABLET (5 MG TOTAL) BY MOUTH DAILY.  Marland Kitchen amLODipine (NORVASC) 5 MG tablet  TAKE 1 TABLET (5 MG TOTAL) BY MOUTH DAILY.  Marland Kitchen apixaban (ELIQUIS) 5 MG TABS tablet Take 5 mg by mouth 2 (two) times daily.   Marland Kitchen levothyroxine (SYNTHROID, LEVOTHROID) 50 MCG tablet TAKE 1 TABLET BY MOUTH DAILY ON EMPTY STOMACH WITH GLASS OF WATER 30-60MINS BEFORE BREAKFAST  . losartan (COZAAR) 100 MG tablet TAKE 1 TABLET (100 MG TOTAL) BY MOUTH DAILY.  . pantoprazole (PROTONIX) 40 MG tablet TAKE 1 TABLET BY MOUTH TWICE A DAY  . spironolactone (ALDACTONE) 25 MG tablet TAKE 1/2 TABLET BY MOUTH EVERY DAY  . traMADol (ULTRAM) 50 MG tablet TAKE 1 TABLET BY MOUTH TWICE A DAY AS NEEDED   No facility-administered encounter medications on file as of 04/25/2017.     Review of Systems  Constitutional:       Some weight loss.  Eating.    HENT: Negative for congestion and sinus pressure.   Eyes: Negative for pain and visual disturbance.  Respiratory: Negative for cough, chest tightness and shortness of breath.   Cardiovascular: Negative for chest pain, palpitations and leg swelling.  Gastrointestinal: Negative for abdominal pain, diarrhea, nausea and vomiting.       Some minmal constipation.    Genitourinary: Negative for difficulty urinating and dysuria.  Musculoskeletal: Positive for back pain. Negative for joint swelling.  Skin: Negative for color change and rash.  Neurological: Negative for dizziness, light-headedness and headaches.  Hematological: Negative for adenopathy. Does not bruise/bleed easily.  Psychiatric/Behavioral: Negative for agitation and dysphoric mood.       Objective:    Physical Exam  Constitutional: She is oriented to person, place, and time. She appears well-developed and well-nourished. No distress.  HENT:  Nose: Nose normal.  Mouth/Throat: Oropharynx is clear and moist.  Eyes: Right eye exhibits no discharge. Left eye exhibits no discharge. No scleral icterus.  Neck: Neck supple. No thyromegaly present.  Cardiovascular: Normal rate and regular rhythm.     Pulmonary/Chest: Breath sounds normal. No accessory muscle usage. No tachypnea. No respiratory distress. She has no decreased breath sounds. She has no wheezes. She has no rhonchi. Right breast exhibits no inverted nipple, no mass, no nipple discharge and no tenderness (no axillary adenopathy). Left breast exhibits no inverted nipple, no mass, no nipple discharge and no tenderness (no axilarry adenopathy).  Abdominal: Soft. Bowel sounds are normal. There is no tenderness.  Musculoskeletal: She exhibits no edema or tenderness.  Lymphadenopathy:    She has no cervical adenopathy.  Neurological: She is alert and oriented to person, place, and time.  Skin: Skin is warm. No rash noted. No erythema.  Psychiatric: She has a normal mood and affect. Her behavior is normal.    BP (!) 148/80 (BP Location: Left Arm, Patient Position: Sitting, Cuff Size: Normal)   Pulse 92   Temp 97.9 F (36.6 C) (Oral)   Resp 20   Ht 5' 2.99" (1.6 m)   Wt 163 lb 12.8 oz (74.3 kg)   SpO2 98%   BMI 29.02 kg/m  Wt Readings from Last 3 Encounters:  04/25/17 163 lb 12.8 oz (74.3 kg)  12/18/16 169 lb 3.2 oz (76.7 kg)  08/15/16 172 lb (78 kg)     Lab Results  Component Value Date   WBC 6.7 08/15/2016   HGB 13.6 08/15/2016   HCT 39.7 08/15/2016   PLT 223.0 08/15/2016   GLUCOSE 89 04/23/2017   CHOL 212 (H) 04/23/2017   TRIG 75.0 04/23/2017   HDL 69.50 04/23/2017   LDLCALC 127 (H) 04/23/2017   ALT 12 04/23/2017   AST 15 04/23/2017   NA 137 04/23/2017   K 4.4 04/23/2017   CL 100 04/23/2017   CREATININE 1.13 04/23/2017   BUN 16 04/23/2017   CO2 30 04/23/2017   TSH 2.25 08/15/2016   INR 1.04 04/19/2015   HGBA1C 5.6 04/23/2017       Assessment & Plan:   Problem List Items Addressed This Visit    Acid reflux    Controlled on protonix.        Atrial fibrillation ([place])    Has pacemaker in placed.  Followed by cardiology.  Stable.        Back pain    Persistent.  Declines further xrays or  evaluation.  Taking tramadol.  Follow.        Essential hypertension, benign    Blood pressure has been under reasonable control.  Slightly elevated on initial check today.  Recheck improved.  Follow pressures.  Same medication regimen.        Health care maintenance    Physical today 04/25/17.  She  declines further mammogram.        Hypercholesterolemia    Follow lipid panel and liver function tests.        Hypothyroidism    On thyroid replacement.  Follow tsh.       Pulmonary hypertension ([place])    Followed by cardiology.        Renal cell carcinoma ([place])    Has a renal mass.  Have discussed further w/up.  Discussed again today.  She declines.  Follow.        Sick sinus syndrome [facility])    S/p pacemaker placement.  Followed by cardiology.            Einar Pheasant, MD

## 2017-04-28 ENCOUNTER — Encounter: Payer: Self-pay | Admitting: Internal Medicine

## 2017-04-28 NOTE — Assessment & Plan Note (Signed)
Followed by cardiology 

## 2017-04-28 NOTE — Assessment & Plan Note (Signed)
Blood pressure has been under reasonable control.  Slightly elevated on initial check today.  Recheck improved.  Follow pressures.  Same medication regimen.

## 2017-04-28 NOTE — Assessment & Plan Note (Signed)
Has pacemaker in placed.  Followed by cardiology.  Stable.

## 2017-04-28 NOTE — Assessment & Plan Note (Signed)
Has a renal mass.  Have discussed further w/up.  Discussed again today.  She declines.  Follow.

## 2017-04-28 NOTE — Assessment & Plan Note (Signed)
Controlled on protonix.   

## 2017-04-28 NOTE — Assessment & Plan Note (Signed)
S/p pacemaker placement.  Followed by cardiology.  

## 2017-04-28 NOTE — Assessment & Plan Note (Signed)
On thyroid replacement.  Follow tsh.  

## 2017-04-28 NOTE — Assessment & Plan Note (Signed)
Persistent.  Declines further xrays or evaluation.  Taking tramadol.  Follow.

## 2017-04-28 NOTE — Assessment & Plan Note (Signed)
Follow lipid panel and liver function tests.

## 2017-05-09 DIAGNOSIS — Z950 Presence of cardiac pacemaker: Secondary | ICD-10-CM | POA: Diagnosis not present

## 2017-05-09 DIAGNOSIS — E782 Mixed hyperlipidemia: Secondary | ICD-10-CM | POA: Diagnosis not present

## 2017-05-09 DIAGNOSIS — I272 Pulmonary hypertension, unspecified: Secondary | ICD-10-CM | POA: Diagnosis not present

## 2017-05-09 DIAGNOSIS — I482 Chronic atrial fibrillation: Secondary | ICD-10-CM | POA: Diagnosis not present

## 2017-05-09 DIAGNOSIS — I495 Sick sinus syndrome: Secondary | ICD-10-CM | POA: Diagnosis not present

## 2017-05-09 DIAGNOSIS — I10 Essential (primary) hypertension: Secondary | ICD-10-CM | POA: Diagnosis not present

## 2017-05-09 DIAGNOSIS — I2510 Atherosclerotic heart disease of native coronary artery without angina pectoris: Secondary | ICD-10-CM | POA: Diagnosis not present

## 2017-05-09 DIAGNOSIS — I340 Nonrheumatic mitral (valve) insufficiency: Secondary | ICD-10-CM | POA: Diagnosis not present

## 2017-05-15 ENCOUNTER — Other Ambulatory Visit: Payer: Self-pay | Admitting: Internal Medicine

## 2017-05-15 NOTE — Telephone Encounter (Signed)
Faxed

## 2017-05-15 NOTE — Telephone Encounter (Signed)
Last OV 04/25/2017 Next OV 08/05/2017 Last refill 03/08/2017

## 2017-06-08 ENCOUNTER — Other Ambulatory Visit: Payer: Self-pay | Admitting: Internal Medicine

## 2017-07-08 ENCOUNTER — Other Ambulatory Visit: Payer: Self-pay | Admitting: Internal Medicine

## 2017-07-12 ENCOUNTER — Other Ambulatory Visit: Payer: Self-pay | Admitting: Internal Medicine

## 2017-07-12 NOTE — Telephone Encounter (Signed)
Okay to refill? 

## 2017-07-15 ENCOUNTER — Other Ambulatory Visit: Payer: Self-pay | Admitting: Family Medicine

## 2017-07-15 MED ORDER — TRAMADOL HCL 50 MG PO TABS: 50.0000 mg | ORAL_TABLET | Freq: Two times a day (BID) | ORAL | 0 refills | Status: DC | PRN

## 2017-07-15 NOTE — Telephone Encounter (Signed)
Tramadol Rx faxed to [facility]

## 2017-07-16 ENCOUNTER — Other Ambulatory Visit: Payer: Self-pay | Admitting: Internal Medicine

## 2017-08-05 ENCOUNTER — Ambulatory Visit: Payer: Medicare HMO | Admitting: Internal Medicine

## 2017-08-13 ENCOUNTER — Telehealth: Payer: Self-pay

## 2017-08-13 ENCOUNTER — Other Ambulatory Visit: Payer: Self-pay | Admitting: Internal Medicine

## 2017-08-13 DIAGNOSIS — R739 Hyperglycemia, unspecified: Secondary | ICD-10-CM

## 2017-08-13 DIAGNOSIS — E7800 Pure hypercholesterolemia, unspecified: Secondary | ICD-10-CM

## 2017-08-13 DIAGNOSIS — E038 Other specified hypothyroidism: Secondary | ICD-10-CM

## 2017-08-13 DIAGNOSIS — I10 Essential (primary) hypertension: Secondary | ICD-10-CM

## 2017-08-13 DIAGNOSIS — C641 Malignant neoplasm of right kidney, except renal pelvis: Secondary | ICD-10-CM

## 2017-08-13 DIAGNOSIS — I482 Chronic atrial fibrillation, unspecified: Secondary | ICD-10-CM

## 2017-08-13 NOTE — Telephone Encounter (Signed)
Copied from [place]. Topic: Appointment Scheduling - Scheduling Inquiry for Clinic >> Aug 13, 2017  9:35 AM Synthia Innocent wrote: Reason for CRM:  Calling and requesting lab work prior to appt, patient is going to scheduled follow up on mychart, states labs are always done prior to appt

## 2017-08-13 NOTE — Telephone Encounter (Signed)
please advise.

## 2017-08-13 NOTE — Telephone Encounter (Signed)
Please order labs for PE.

## 2017-08-13 NOTE — Telephone Encounter (Signed)
I have placed orders for labs.  She will need cpe and fasting lab appt scheduled.  Thanks

## 2017-08-13 NOTE — Progress Notes (Signed)
Orders placed for labs

## 2017-08-14 NOTE — Telephone Encounter (Signed)
Lm to schedule

## 2017-08-15 ENCOUNTER — Other Ambulatory Visit: Payer: Self-pay | Admitting: Family Medicine

## 2017-08-15 NOTE — Telephone Encounter (Signed)
Last OV 04/25/17 last filled by Dr.Sonnenberg 07/15/17 [id number] [id number]

## 2017-08-15 NOTE — Telephone Encounter (Signed)
Okay to refill? Last written on 06/12/17 #60 with 1 refill.  LOV: 04/25/17 NOV: 09/23/17

## 2017-08-15 NOTE — Telephone Encounter (Signed)
Son called to follow up on tramadol request.  He says pt about to run out.

## 2017-08-15 NOTE — Telephone Encounter (Signed)
[facility] 

## 2017-08-16 NOTE — Telephone Encounter (Signed)
Duplicate. Already refilled.

## 2017-09-09 ENCOUNTER — Other Ambulatory Visit: Payer: Self-pay | Admitting: Internal Medicine

## 2017-09-16 ENCOUNTER — Other Ambulatory Visit (INDEPENDENT_AMBULATORY_CARE_PROVIDER_SITE_OTHER): Payer: Medicare HMO

## 2017-09-16 DIAGNOSIS — I482 Chronic atrial fibrillation, unspecified: Secondary | ICD-10-CM

## 2017-09-16 DIAGNOSIS — I10 Essential (primary) hypertension: Secondary | ICD-10-CM | POA: Diagnosis not present

## 2017-09-16 DIAGNOSIS — E7800 Pure hypercholesterolemia, unspecified: Secondary | ICD-10-CM | POA: Diagnosis not present

## 2017-09-16 DIAGNOSIS — R739 Hyperglycemia, unspecified: Secondary | ICD-10-CM

## 2017-09-16 LAB — CBC WITH DIFFERENTIAL/PLATELET
BASOS PCT: 1.3 % (ref 0.0–3.0)
Basophils Absolute: 0.1 10*3/uL (ref 0.0–0.1)
EOS PCT: 1.2 % (ref 0.0–5.0)
Eosinophils Absolute: 0.1 10*3/uL (ref 0.0–0.7)
HCT: 40.8 % (ref 36.0–46.0)
Hemoglobin: 14 g/dL (ref 12.0–15.0)
LYMPHS ABS: 1.2 10*3/uL (ref 0.7–4.0)
Lymphocytes Relative: 22.9 % (ref 12.0–46.0)
MCHC: 34.4 g/dL (ref 30.0–36.0)
MCV: 88.1 fl (ref 78.0–100.0)
MONOS PCT: 8.2 % (ref 3.0–12.0)
Monocytes Absolute: 0.4 10*3/uL (ref 0.1–1.0)
NEUTROS ABS: 3.6 10*3/uL (ref 1.4–7.7)
NEUTROS PCT: 66.4 % (ref 43.0–77.0)
PLATELETS: 248 10*3/uL (ref 150.0–400.0)
RBC: 4.63 Mil/uL (ref 3.87–5.11)
RDW: 13.1 % (ref 11.5–15.5)
WBC: 5.4 10*3/uL (ref 4.0–10.5)

## 2017-09-16 LAB — LIPID PANEL
CHOLESTEROL: 206 mg/dL — AB (ref 0–200)
HDL: 72 mg/dL (ref >39.00)
LDL Cholesterol: 114 mg/dL — ABNORMAL HIGH (ref 0–99)
NonHDL: 133.8
Total CHOL/HDL Ratio: 3
Triglycerides: 97 mg/dL (ref 0.0–149.0)
VLDL: 19.4 mg/dL (ref 0.0–40.0)

## 2017-09-16 LAB — BASIC METABOLIC PANEL
BUN: 16 mg/dL (ref 6–23)
CO2: 28 mEq/L (ref 19–32)
CREATININE: 1.16 mg/dL (ref 0.40–1.20)
Calcium: 10.1 mg/dL (ref 8.4–10.5)
Chloride: 98 mEq/L (ref 96–112)
GFR: 46.44 mL/min — ABNORMAL LOW (ref >60.00)
Glucose, Bld: 101 mg/dL — ABNORMAL HIGH (ref 70–99)
POTASSIUM: 4.9 meq/L (ref 3.5–5.1)
Sodium: 136 mEq/L (ref 135–145)

## 2017-09-16 LAB — HEPATIC FUNCTION PANEL
ALT: 14 U/L (ref 0–35)
AST: 18 U/L (ref 0–37)
Albumin: 4.4 g/dL (ref 3.5–5.2)
Alkaline Phosphatase: 57 U/L (ref 39–117)
BILIRUBIN DIRECT: 0.2 mg/dL (ref 0.0–0.3)
BILIRUBIN TOTAL: 0.8 mg/dL (ref 0.2–1.2)
Total Protein: 7.3 g/dL (ref 6.0–8.3)

## 2017-09-16 LAB — HEMOGLOBIN A1C: Hgb A1c MFr Bld: 5.4 % (ref 4.6–6.5)

## 2017-09-16 LAB — TSH: TSH: 2.28 u[IU]/mL (ref 0.35–4.50)

## 2017-09-17 ENCOUNTER — Encounter: Payer: Self-pay | Admitting: Internal Medicine

## 2017-09-23 ENCOUNTER — Ambulatory Visit (INDEPENDENT_AMBULATORY_CARE_PROVIDER_SITE_OTHER): Payer: Medicare HMO | Admitting: Internal Medicine

## 2017-09-23 ENCOUNTER — Encounter: Payer: Self-pay | Admitting: Internal Medicine

## 2017-09-23 VITALS — BP 138/68 | HR 71 | Temp 97.8°F | Resp 18 | Wt 154.6 lb

## 2017-09-23 DIAGNOSIS — E7800 Pure hypercholesterolemia, unspecified: Secondary | ICD-10-CM

## 2017-09-23 DIAGNOSIS — R634 Abnormal weight loss: Secondary | ICD-10-CM

## 2017-09-23 DIAGNOSIS — I482 Chronic atrial fibrillation, unspecified: Secondary | ICD-10-CM

## 2017-09-23 DIAGNOSIS — K219 Gastro-esophageal reflux disease without esophagitis: Secondary | ICD-10-CM | POA: Diagnosis not present

## 2017-09-23 DIAGNOSIS — I10 Essential (primary) hypertension: Secondary | ICD-10-CM | POA: Diagnosis not present

## 2017-09-23 DIAGNOSIS — I272 Pulmonary hypertension, unspecified: Secondary | ICD-10-CM

## 2017-09-23 DIAGNOSIS — K5900 Constipation, unspecified: Secondary | ICD-10-CM

## 2017-09-23 DIAGNOSIS — E038 Other specified hypothyroidism: Secondary | ICD-10-CM | POA: Diagnosis not present

## 2017-09-23 DIAGNOSIS — I2510 Atherosclerotic heart disease of native coronary artery without angina pectoris: Secondary | ICD-10-CM | POA: Diagnosis not present

## 2017-09-23 DIAGNOSIS — C641 Malignant neoplasm of right kidney, except renal pelvis: Secondary | ICD-10-CM

## 2017-09-23 DIAGNOSIS — I495 Sick sinus syndrome: Secondary | ICD-10-CM

## 2017-09-23 NOTE — Progress Notes (Signed)
Patient ID: Heidi Baldwin, female   DOB: Mar 26, 1924, 84 y.o.   MRN: [id number]   Subjective:    Patient ID: Heidi Baldwin, female    DOB: 01-20-1924, 84 y.o.   MRN: [id number]  HPI  Patient here for a scheduled follow up.  Has a history of afib and sick sinus syndrome.  S/p pacemaker placement.  Saw cardiology 05/09/17.  Stable.  No changes made.  Remains on eliquis.  No chest pain.  No increased heart rate or palpitations.  No acid reflux.  States she is on protonix daily.  Does not need twice daily.  Controlled.  Some constipation.  Has bowel movement qod.  Eating apricots now.  Discussed taking miralax daily.  She did fall approximately one month ago.  Hurt her left knee and left side.  Resolved.  She tripped over a box that had been placed on her porch.  No head injury.  No LOC. No other falls.  She has lost weight.  Discussed with her today. States she has been under increased stress.  Her second son and his wife came to live with her for a month.  Did not eat well during this time.  Increased stress.  They have moved out now.  Moved last week.  She is eating better now.  Stress is better.  Weight starting to come up  - per her report.     Past Medical History:  Diagnosis Date  . Acid reflux   . Allergy   . Arthritis   . Atrial fibrillation ([place])   . Heart disease   . Heart murmur   . Hx of actinic keratosis May 2015   under left breast  . Hypercholesterolemia   . Hypertension   . Hypothyroidism    Past Surgical History:  Procedure Laterality Date  . ABDOMINAL HYSTERECTOMY    . CHOLECYSTECTOMY    . CORNEAL TRANSPLANT     Right x 3 Left x1  . CYST REMOVAL TRUNK     Tail bone  . KNEE SURGERY Left   . PACEMAKER INSERTION  05/03/13   Family History  Problem Relation Age of Onset  . Diabetes Mother   . Heart disease Father   . Hyperlipidemia Father   . Heart disease Sister   . Arthritis Son    Social History   Socioeconomic History  . Marital status: Widowed    Spouse name:  None  . Number of children: None  . Years of education: None  . Highest education level: None  Social Needs  . Financial resource strain: None  . Food insecurity - worry: None  . Food insecurity - inability: None  . Transportation needs - medical: None  . Transportation needs - non-medical: None  Occupational History  . None  Tobacco Use  . Smoking status: Never Smoker  . Smokeless tobacco: Never Used  Substance and Sexual Activity  . Alcohol use: No    Alcohol/week: 0.0 oz  . Drug use: No  . Sexual activity: None  Other Topics Concern  . None  Social History Narrative  . None    Outpatient Encounter Medications as of 09/23/2017  Medication Sig  . amLODipine (NORVASC) 5 MG tablet TAKE 1 TABLET (5 MG TOTAL) BY MOUTH DAILY.  Marland Kitchen apixaban (ELIQUIS) 5 MG TABS tablet Take 5 mg by mouth 2 (two) times daily.   Marland Kitchen levothyroxine (SYNTHROID, LEVOTHROID) 50 MCG tablet TAKE 1 TABLET BY MOUTH DAILY ON EMPTY STOMACH WITH GLASS OF WATER 30-60MINS  BEFORE BREAKFAST  . losartan (COZAAR) 100 MG tablet TAKE 1 TABLET (100 MG TOTAL) BY MOUTH DAILY.  . pantoprazole (PROTONIX) 40 MG tablet TAKE 1 TABLET BY MOUTH TWICE A DAY (Patient taking differently: TAKE ONE TABLET BY MOUTH ONCE DAILY)  . spironolactone (ALDACTONE) 25 MG tablet TAKE 1/2 TABLET BY MOUTH EVERY DAY  . traMADol (ULTRAM) 50 MG tablet TAKE 1 TABLET BY MOUTH TWICE A DAY AS NEEDED   No facility-administered encounter medications on file as of 09/23/2017.     Review of Systems  Constitutional:       Decreased weight.  She relates to increased stress and not eating as much.    HENT: Negative for congestion and sinus pressure.   Respiratory: Negative for cough, chest tightness and shortness of breath.   Cardiovascular: Negative for chest pain, palpitations and leg swelling.  Gastrointestinal: Positive for constipation. Negative for abdominal pain, diarrhea, nausea and vomiting.  Genitourinary: Negative for difficulty urinating and dysuria.    Musculoskeletal: Negative for joint swelling and myalgias.  Skin: Negative for color change and rash.  Neurological: Negative for dizziness, light-headedness and headaches.  Psychiatric/Behavioral: Negative for agitation and dysphoric mood.       Increased stress previously as outlined.  Better now.        Objective:     Blood pressure rechecked by me:  138/68  Physical Exam  Constitutional: She appears well-developed and well-nourished. No distress.  HENT:  Nose: Nose normal.  Mouth/Throat: Oropharynx is clear and moist.  Neck: Neck supple. No thyromegaly present.  Cardiovascular: Normal rate.  Rate controlled.   Pulmonary/Chest: Breath sounds normal. No respiratory distress. She has no wheezes.  Abdominal: Soft. Bowel sounds are normal. There is no tenderness.  Musculoskeletal: She exhibits no edema or tenderness.  Lymphadenopathy:    She has no cervical adenopathy.  Skin: No rash noted. No erythema.  Psychiatric: She has a normal mood and affect. Her behavior is normal.    BP 138/68   Pulse 71   Temp 97.8 F (36.6 C) (Oral)   Resp 18   Wt 154 lb 9.6 oz (70.1 kg)   SpO2 98%   BMI 27.39 kg/m  Wt Readings from Last 3 Encounters:  09/23/17 154 lb 9.6 oz (70.1 kg)  04/25/17 163 lb 12.8 oz (74.3 kg)  12/18/16 169 lb 3.2 oz (76.7 kg)     Lab Results  Component Value Date   WBC 5.4 09/16/2017   HGB 14.0 09/16/2017   HCT 40.8 09/16/2017   PLT 248.0 09/16/2017   GLUCOSE 101 (H) 09/16/2017   CHOL 206 (H) 09/16/2017   TRIG 97.0 09/16/2017   HDL 72.00 09/16/2017   LDLCALC 114 (H) 09/16/2017   ALT 14 09/16/2017   AST 18 09/16/2017   NA 136 09/16/2017   K 4.9 09/16/2017   CL 98 09/16/2017   CREATININE 1.16 09/16/2017   BUN 16 09/16/2017   CO2 28 09/16/2017   TSH 2.28 09/16/2017   INR 1.04 04/19/2015   HGBA1C 5.4 09/16/2017       Assessment & Plan:   Problem List Items Addressed This Visit    Acid reflux    Controlled on daily protonix.  Does not need bid  dosing now.  Follow.       Arteriosclerosis of coronary artery    Known CAD.  Followed by cardiology.  Continue risk factor modification.       Atrial fibrillation ([place])    Has pacemaker in place.  Followed by  cardiology.  Stable.        Essential hypertension, benign    Blood pressure on recheck improved.  Continue same medication regimen.  Follow pressures.  Follow metabolic panel.        Hypercholesterolemia    Follow lipid panel and liver function tests.        Hypothyroidism    On thyroid replacement.  Follow tsh.       Pulmonary hypertension ([place])    Followed by cardiology.       Renal cell carcinoma ([place])    Has a known renal mass.  Discussed again with her today.  She declines any further evaluation or w/up.        Sick sinus syndrome [facility])    S/p pacemaker placement.  Followed by cardiology.        Weight loss    Has lost more weight.  She relates to the increased stress.  Eating better now.  States weight coming back up.  Declines any further w/up at this time.  Follow.         Other Visit Diagnoses    Constipation, unspecified constipation type    -  Primary   Will restart miralax daily.  Follow.        Einar Pheasant, MD

## 2017-09-24 ENCOUNTER — Encounter: Payer: Self-pay | Admitting: Internal Medicine

## 2017-09-25 ENCOUNTER — Encounter: Payer: Self-pay | Admitting: Internal Medicine

## 2017-09-25 DIAGNOSIS — R634 Abnormal weight loss: Secondary | ICD-10-CM | POA: Insufficient documentation

## 2017-09-25 NOTE — Assessment & Plan Note (Signed)
Has lost more weight.  She relates to the increased stress.  Eating better now.  States weight coming back up.  Declines any further w/up at this time.  Follow.

## 2017-09-25 NOTE — Assessment & Plan Note (Signed)
On thyroid replacement.  Follow tsh.  

## 2017-09-25 NOTE — Assessment & Plan Note (Signed)
Known CAD.  Followed by cardiology.  Continue risk factor modification.

## 2017-09-25 NOTE — Assessment & Plan Note (Signed)
S/p pacemaker placement.  Followed by cardiology.  

## 2017-09-25 NOTE — Assessment & Plan Note (Signed)
Followed by cardiology 

## 2017-09-25 NOTE — Assessment & Plan Note (Signed)
Controlled on daily protonix.  Does not need bid dosing now.  Follow.

## 2017-09-25 NOTE — Assessment & Plan Note (Signed)
Blood pressure on recheck improved.  Continue same medication regimen.  Follow pressures.  Follow metabolic panel.   

## 2017-09-25 NOTE — Assessment & Plan Note (Signed)
Has pacemaker in place.  Followed by cardiology.  Stable.

## 2017-09-25 NOTE — Assessment & Plan Note (Signed)
Follow lipid panel and liver function tests.

## 2017-09-25 NOTE — Assessment & Plan Note (Signed)
Has a known renal mass.  Discussed again with her today.  She declines any further evaluation or w/up.

## 2017-09-26 ENCOUNTER — Other Ambulatory Visit: Payer: Self-pay | Admitting: Internal Medicine

## 2017-10-14 ENCOUNTER — Other Ambulatory Visit: Payer: Self-pay | Admitting: Internal Medicine

## 2017-10-16 NOTE — Telephone Encounter (Signed)
Last OV 09/23/2017 Next OV 12/27/2017 Last refill 08/15/2017

## 2017-11-18 ENCOUNTER — Other Ambulatory Visit: Payer: Self-pay | Admitting: Internal Medicine

## 2017-11-19 DIAGNOSIS — I482 Chronic atrial fibrillation: Secondary | ICD-10-CM | POA: Diagnosis not present

## 2017-11-19 DIAGNOSIS — I10 Essential (primary) hypertension: Secondary | ICD-10-CM | POA: Diagnosis not present

## 2017-11-19 DIAGNOSIS — I493 Ventricular premature depolarization: Secondary | ICD-10-CM | POA: Diagnosis not present

## 2017-11-19 DIAGNOSIS — I495 Sick sinus syndrome: Secondary | ICD-10-CM | POA: Diagnosis not present

## 2017-11-19 DIAGNOSIS — I517 Cardiomegaly: Secondary | ICD-10-CM | POA: Diagnosis not present

## 2017-11-19 DIAGNOSIS — Z950 Presence of cardiac pacemaker: Secondary | ICD-10-CM | POA: Diagnosis not present

## 2017-11-19 DIAGNOSIS — E782 Mixed hyperlipidemia: Secondary | ICD-10-CM | POA: Diagnosis not present

## 2017-11-19 DIAGNOSIS — I340 Nonrheumatic mitral (valve) insufficiency: Secondary | ICD-10-CM | POA: Diagnosis not present

## 2017-11-19 DIAGNOSIS — I2510 Atherosclerotic heart disease of native coronary artery without angina pectoris: Secondary | ICD-10-CM | POA: Diagnosis not present

## 2017-12-05 ENCOUNTER — Other Ambulatory Visit: Payer: Self-pay | Admitting: Internal Medicine

## 2017-12-17 ENCOUNTER — Other Ambulatory Visit: Payer: Self-pay | Admitting: Internal Medicine

## 2017-12-17 NOTE — Telephone Encounter (Signed)
Last refilled on 10/16/17 #60 with 1 refill.  LOV: 09/23/17 NOV: 12/27/17

## 2017-12-27 ENCOUNTER — Ambulatory Visit (INDEPENDENT_AMBULATORY_CARE_PROVIDER_SITE_OTHER): Payer: Medicare HMO | Admitting: Internal Medicine

## 2017-12-27 VITALS — BP 148/78 | HR 71 | Temp 98.1°F | Resp 18 | Wt 154.8 lb

## 2017-12-27 DIAGNOSIS — I10 Essential (primary) hypertension: Secondary | ICD-10-CM | POA: Diagnosis not present

## 2017-12-27 DIAGNOSIS — I2510 Atherosclerotic heart disease of native coronary artery without angina pectoris: Secondary | ICD-10-CM | POA: Diagnosis not present

## 2017-12-27 DIAGNOSIS — E038 Other specified hypothyroidism: Secondary | ICD-10-CM

## 2017-12-27 DIAGNOSIS — C641 Malignant neoplasm of right kidney, except renal pelvis: Secondary | ICD-10-CM

## 2017-12-27 DIAGNOSIS — I482 Chronic atrial fibrillation, unspecified: Secondary | ICD-10-CM

## 2017-12-27 DIAGNOSIS — R739 Hyperglycemia, unspecified: Secondary | ICD-10-CM | POA: Diagnosis not present

## 2017-12-27 DIAGNOSIS — I272 Pulmonary hypertension, unspecified: Secondary | ICD-10-CM

## 2017-12-27 DIAGNOSIS — E7800 Pure hypercholesterolemia, unspecified: Secondary | ICD-10-CM | POA: Diagnosis not present

## 2017-12-27 DIAGNOSIS — Z9109 Other allergy status, other than to drugs and biological substances: Secondary | ICD-10-CM

## 2017-12-27 DIAGNOSIS — R634 Abnormal weight loss: Secondary | ICD-10-CM | POA: Diagnosis not present

## 2017-12-27 DIAGNOSIS — K219 Gastro-esophageal reflux disease without esophagitis: Secondary | ICD-10-CM | POA: Diagnosis not present

## 2017-12-27 LAB — BASIC METABOLIC PANEL
BUN: 19 mg/dL (ref 6–23)
CALCIUM: 9.7 mg/dL (ref 8.4–10.5)
CO2: 29 mEq/L (ref 19–32)
Chloride: 98 mEq/L (ref 96–112)
Creatinine, Ser: 1 mg/dL (ref 0.40–1.20)
GFR: 55.08 mL/min — ABNORMAL LOW (ref >60.00)
Glucose, Bld: 99 mg/dL (ref 70–99)
Potassium: 4.3 mEq/L (ref 3.5–5.1)
SODIUM: 134 meq/L — AB (ref 135–145)

## 2017-12-27 LAB — HEPATIC FUNCTION PANEL
ALK PHOS: 70 U/L (ref 39–117)
ALT: 14 U/L (ref 0–35)
AST: 19 U/L (ref 0–37)
Albumin: 4.5 g/dL (ref 3.5–5.2)
BILIRUBIN TOTAL: 0.7 mg/dL (ref 0.2–1.2)
Bilirubin, Direct: 0.1 mg/dL (ref 0.0–0.3)
Total Protein: 7.5 g/dL (ref 6.0–8.3)

## 2017-12-27 LAB — LIPID PANEL
CHOL/HDL RATIO: 3
Cholesterol: 221 mg/dL — ABNORMAL HIGH (ref 0–200)
HDL: 70.6 mg/dL (ref >39.00)
LDL CALC: 131 mg/dL — AB (ref 0–99)
NONHDL: 149.96
Triglycerides: 96 mg/dL (ref 0.0–149.0)
VLDL: 19.2 mg/dL (ref 0.0–40.0)

## 2017-12-27 LAB — HEMOGLOBIN A1C: HEMOGLOBIN A1C: 5.4 % (ref 4.6–6.5)

## 2017-12-27 NOTE — Patient Instructions (Signed)
nasacort nasal spray - 2 sprays each nostril one time per day.  Do this in the evening.   

## 2017-12-27 NOTE — Progress Notes (Signed)
Patient ID: Heidi Baldwin, female   DOB: Dec 02, 1923, 84 y.o.   MRN: [id number]   Subjective:    Patient ID: SHARELLE BURDITT, female    DOB: 02/04/1924, 84 y.o.   MRN: [id number]  HPI  Patient here for a scheduled follow up.  She reports she is doing relatively well.  Some nasal congestion/drainage.  No significant sinus pressure or symptoms.  No cough or chest congestion.  No sob.  No chest pain.  No acid reflux.  No abdominal pain.  Bowels moving.  No urine change.  Still with msk pain.  Discussed further w/up, evaluation, etc.  She declines.  Wants to follow.  Desires no further testing.     Past Medical History:  Diagnosis Date  . Acid reflux   . Allergy   . Arthritis   . Atrial fibrillation ([place])   . Heart disease   . Heart murmur   . Hx of actinic keratosis May 2015   under left breast  . Hypercholesterolemia   . Hypertension   . Hypothyroidism    Past Surgical History:  Procedure Laterality Date  . ABDOMINAL HYSTERECTOMY    . CHOLECYSTECTOMY    . CORNEAL TRANSPLANT     Right x 3 Left x1  . CYST REMOVAL TRUNK     Tail bone  . KNEE SURGERY Left   . PACEMAKER INSERTION  05/03/13   Family History  Problem Relation Age of Onset  . Diabetes Mother   . Heart disease Father   . Hyperlipidemia Father   . Heart disease Sister   . Arthritis Son    Social History   Socioeconomic History  . Marital status: Widowed    Spouse name: Not on file  . Number of children: Not on file  . Years of education: Not on file  . Highest education level: Not on file  Occupational History  . Not on file  Social Needs  . Financial resource strain: Not on file  . Food insecurity:    Worry: Not on file    Inability: Not on file  . Transportation needs:    Medical: Not on file    Non-medical: Not on file  Tobacco Use  . Smoking status: Never Smoker  . Smokeless tobacco: Never Used  Substance and Sexual Activity  . Alcohol use: No    Alcohol/week: 0.0 oz  . Drug use: No  . Sexual  activity: Not on file  Lifestyle  . Physical activity:    Days per week: Not on file    Minutes per session: Not on file  . Stress: Not on file  Relationships  . Social connections:    Talks on phone: Not on file    Gets together: Not on file    Attends religious service: Not on file    Active member of club or organization: Not on file    Attends meetings of clubs or organizations: Not on file    Relationship status: Not on file  Other Topics Concern  . Not on file  Social History Narrative  . Not on file    Outpatient Encounter Medications as of 12/27/2017  Medication Sig  . amLODipine (NORVASC) 5 MG tablet TAKE 1 TABLET (5 MG TOTAL) BY MOUTH DAILY.  Marland Kitchen apixaban (ELIQUIS) 5 MG TABS tablet Take 5 mg by mouth 2 (two) times daily.   Marland Kitchen levothyroxine (SYNTHROID, LEVOTHROID) 50 MCG tablet TAKE 1 TABLET BY MOUTH DAILY ON EMPTY STOMACH WITH GLASS OF WATER 30-60MINS  BEFORE BREAKFAST  . losartan (COZAAR) 100 MG tablet TAKE 1 TABLET (100 MG TOTAL) BY MOUTH DAILY.  . pantoprazole (PROTONIX) 40 MG tablet TAKE 1 TABLET BY MOUTH TWICE A DAY (Patient taking differently: TAKE ONE TABLET BY MOUTH ONCE DAILY)  . spironolactone (ALDACTONE) 25 MG tablet TAKE 1/2 TABLET BY MOUTH EVERY DAY  . traMADol (ULTRAM) 50 MG tablet TAKE 1 TABLET BY MOUTH TWICE A DAY AS NEEDED   No facility-administered encounter medications on file as of 12/27/2017.     Review of Systems  Constitutional: Negative for appetite change and unexpected weight change.  HENT: Positive for congestion and postnasal drip. Negative for sinus pressure.   Respiratory: Negative for cough, chest tightness and shortness of breath.   Cardiovascular: Negative for chest pain, palpitations and leg swelling.  Gastrointestinal: Negative for abdominal pain, diarrhea, nausea and vomiting.  Genitourinary: Negative for difficulty urinating and dysuria.  Musculoskeletal: Positive for back pain. Negative for joint swelling.  Skin: Negative for color  change and rash.  Neurological: Negative for dizziness, light-headedness and headaches.  Psychiatric/Behavioral: Negative for agitation and dysphoric mood.       Objective:    Physical Exam  Constitutional: She appears well-developed and well-nourished. No distress.  HENT:  Nose: Nose normal.  Mouth/Throat: Oropharynx is clear and moist.  Neck: Neck supple. No thyromegaly present.  Cardiovascular: Normal rate and regular rhythm.  Pulmonary/Chest: Breath sounds normal. No respiratory distress. She has no wheezes.  Abdominal: Soft. Bowel sounds are normal. There is no tenderness.  Musculoskeletal: She exhibits no edema or tenderness.  Lymphadenopathy:    She has no cervical adenopathy.  Skin: No rash noted. No erythema.  Psychiatric: She has a normal mood and affect. Her behavior is normal.    BP (!) 148/78 (BP Location: Left Arm, Patient Position: Sitting, Cuff Size: Normal)   Pulse 71   Temp 98.1 F (36.7 C) (Oral)   Resp 18   Wt 154 lb 12.8 oz (70.2 kg)   SpO2 98%   BMI 27.43 kg/m  Wt Readings from Last 3 Encounters:  12/27/17 154 lb 12.8 oz (70.2 kg)  09/23/17 154 lb 9.6 oz (70.1 kg)  04/25/17 163 lb 12.8 oz (74.3 kg)     Lab Results  Component Value Date   WBC 5.4 09/16/2017   HGB 14.0 09/16/2017   HCT 40.8 09/16/2017   PLT 248.0 09/16/2017   GLUCOSE 99 12/27/2017   CHOL 221 (H) 12/27/2017   TRIG 96.0 12/27/2017   HDL 70.60 12/27/2017   LDLCALC 131 (H) 12/27/2017   ALT 14 12/27/2017   AST 19 12/27/2017   NA 134 (L) 12/27/2017   K 4.3 12/27/2017   CL 98 12/27/2017   CREATININE 1.00 12/27/2017   BUN 19 12/27/2017   CO2 29 12/27/2017   TSH 2.28 09/16/2017   INR 1.04 04/19/2015   HGBA1C 5.4 12/27/2017       Assessment & Plan:   Problem List Items Addressed This Visit    Acid reflux    Controlled on daily protonix.  Follow.       Arteriosclerosis of coronary artery    Followed by cardiology.        Atrial fibrillation ([place])    Has pacemaker  in place.  Stable.  Followed by cardiology.        Environmental allergies    Some congestion and drainage.  Steroid nasal spray as directed.  Follow.        Essential hypertension, benign - Primary  Blood pressure on recheck improved.  Continue current medication regimen.  Follow pressures.  Follow metabolic panel.        Relevant Orders   Basic metabolic panel (Completed)   Hypercholesterolemia    Follow lipid panel and liver function tests.        Relevant Orders   Hepatic function panel (Completed)   Lipid panel (Completed)   Hypothyroidism    On thyroid replacement.  Follow tsh.        Pulmonary hypertension ([place])    Followed by cardiology.       Renal cell carcinoma ([place])    Has a known renal mass.  Continues to decline any further evaluation, testing or w/up.        Weight loss    Weight is stable from previous check. Is eating.  Follow.         Other Visit Diagnoses    Hyperglycemia       Relevant Orders   Hemoglobin A1c (Completed)       Einar Pheasant, MD

## 2017-12-29 ENCOUNTER — Encounter: Payer: Self-pay | Admitting: Internal Medicine

## 2017-12-30 NOTE — Telephone Encounter (Signed)
Spoke with patient and son about nose spray. Advised that you can get it over the counter. Patient stated that instructions are not on the paperwork she received. I have re-printed AVS and placed in mail

## 2017-12-31 ENCOUNTER — Encounter: Payer: Self-pay | Admitting: Internal Medicine

## 2017-12-31 NOTE — Assessment & Plan Note (Signed)
Some congestion and drainage.  Steroid nasal spray as directed.  Follow.

## 2017-12-31 NOTE — Assessment & Plan Note (Signed)
Has pacemaker in place.  Stable.  Followed by cardiology.   

## 2017-12-31 NOTE — Assessment & Plan Note (Signed)
On thyroid replacement.  Follow tsh.  

## 2017-12-31 NOTE — Assessment & Plan Note (Signed)
Weight is stable from previous check. Is eating.  Follow.

## 2017-12-31 NOTE — Assessment & Plan Note (Signed)
Follow lipid panel and liver function tests.

## 2017-12-31 NOTE — Assessment & Plan Note (Signed)
Followed by cardiology 

## 2017-12-31 NOTE — Assessment & Plan Note (Signed)
Blood pressure on recheck improved.  Continue current medication regimen.  Follow pressures.  Follow metabolic panel.   

## 2017-12-31 NOTE — Assessment & Plan Note (Signed)
Controlled on daily protonix.  Follow.

## 2017-12-31 NOTE — Assessment & Plan Note (Signed)
Has a known renal mass.  Continues to decline any further evaluation, testing or w/up.

## 2018-01-15 ENCOUNTER — Telehealth: Payer: Self-pay | Admitting: Radiology

## 2018-01-15 NOTE — Telephone Encounter (Signed)
Pt coming in Monday for labs, please place future orders. Thank you.

## 2018-01-16 ENCOUNTER — Other Ambulatory Visit: Payer: Self-pay | Admitting: Internal Medicine

## 2018-01-16 DIAGNOSIS — E871 Hypo-osmolality and hyponatremia: Secondary | ICD-10-CM

## 2018-01-16 NOTE — Telephone Encounter (Signed)
Order placed for f/u lab.   

## 2018-01-16 NOTE — Progress Notes (Signed)
Order placed for f/u lab.   

## 2018-01-20 ENCOUNTER — Encounter: Payer: Self-pay | Admitting: Internal Medicine

## 2018-01-20 ENCOUNTER — Other Ambulatory Visit (INDEPENDENT_AMBULATORY_CARE_PROVIDER_SITE_OTHER): Payer: Medicare HMO

## 2018-01-20 DIAGNOSIS — E871 Hypo-osmolality and hyponatremia: Secondary | ICD-10-CM

## 2018-01-20 LAB — SODIUM: Sodium: 134 mEq/L — ABNORMAL LOW (ref 135–145)

## 2018-02-16 ENCOUNTER — Other Ambulatory Visit: Payer: Self-pay | Admitting: Internal Medicine

## 2018-02-17 NOTE — Telephone Encounter (Signed)
Last OV 12/27/17 Next OV 05/02/18 Last refill 12/17/17

## 2018-02-18 NOTE — Telephone Encounter (Signed)
Faxed

## 2018-03-04 ENCOUNTER — Other Ambulatory Visit: Payer: Self-pay | Admitting: Internal Medicine

## 2018-04-17 ENCOUNTER — Other Ambulatory Visit: Payer: Self-pay | Admitting: Internal Medicine

## 2018-04-17 NOTE — Telephone Encounter (Signed)
Last OV 12/27/2017 Next OV 05/02/2018 Last refill 02/18/2018

## 2018-05-02 ENCOUNTER — Encounter: Payer: Self-pay | Admitting: Internal Medicine

## 2018-05-02 ENCOUNTER — Ambulatory Visit (INDEPENDENT_AMBULATORY_CARE_PROVIDER_SITE_OTHER): Payer: Medicare HMO | Admitting: Internal Medicine

## 2018-05-02 VITALS — BP 142/70 | HR 70 | Temp 97.8°F | Ht 63.0 in | Wt 155.2 lb

## 2018-05-02 DIAGNOSIS — E7800 Pure hypercholesterolemia, unspecified: Secondary | ICD-10-CM

## 2018-05-02 DIAGNOSIS — C641 Malignant neoplasm of right kidney, except renal pelvis: Secondary | ICD-10-CM | POA: Diagnosis not present

## 2018-05-02 DIAGNOSIS — K219 Gastro-esophageal reflux disease without esophagitis: Secondary | ICD-10-CM | POA: Diagnosis not present

## 2018-05-02 DIAGNOSIS — Z23 Encounter for immunization: Secondary | ICD-10-CM | POA: Diagnosis not present

## 2018-05-02 DIAGNOSIS — M545 Low back pain, unspecified: Secondary | ICD-10-CM

## 2018-05-02 DIAGNOSIS — R739 Hyperglycemia, unspecified: Secondary | ICD-10-CM

## 2018-05-02 DIAGNOSIS — Z0000 Encounter for general adult medical examination without abnormal findings: Secondary | ICD-10-CM | POA: Diagnosis not present

## 2018-05-02 DIAGNOSIS — I10 Essential (primary) hypertension: Secondary | ICD-10-CM | POA: Diagnosis not present

## 2018-05-02 DIAGNOSIS — I2510 Atherosclerotic heart disease of native coronary artery without angina pectoris: Secondary | ICD-10-CM

## 2018-05-02 DIAGNOSIS — I4891 Unspecified atrial fibrillation: Secondary | ICD-10-CM

## 2018-05-02 DIAGNOSIS — G8929 Other chronic pain: Secondary | ICD-10-CM

## 2018-05-02 DIAGNOSIS — E038 Other specified hypothyroidism: Secondary | ICD-10-CM | POA: Diagnosis not present

## 2018-05-02 DIAGNOSIS — I272 Pulmonary hypertension, unspecified: Secondary | ICD-10-CM

## 2018-05-02 LAB — CBC WITH DIFFERENTIAL/PLATELET
BASOS PCT: 1.1 % (ref 0.0–3.0)
Basophils Absolute: 0.1 10*3/uL (ref 0.0–0.1)
EOS ABS: 0.1 10*3/uL (ref 0.0–0.7)
Eosinophils Relative: 0.9 % (ref 0.0–5.0)
HCT: 40.4 % (ref 36.0–46.0)
Hemoglobin: 13.6 g/dL (ref 12.0–15.0)
Lymphocytes Relative: 18.8 % (ref 12.0–46.0)
Lymphs Abs: 1.6 10*3/uL (ref 0.7–4.0)
MCHC: 33.6 g/dL (ref 30.0–36.0)
MCV: 86.8 fl (ref 78.0–100.0)
MONO ABS: 0.6 10*3/uL (ref 0.1–1.0)
Monocytes Relative: 7.6 % (ref 3.0–12.0)
NEUTROS ABS: 6 10*3/uL (ref 1.4–7.7)
Neutrophils Relative %: 71.6 % (ref 43.0–77.0)
PLATELETS: 244 10*3/uL (ref 150.0–400.0)
RBC: 4.65 Mil/uL (ref 3.87–5.11)
RDW: 13.2 % (ref 11.5–15.5)
WBC: 8.3 10*3/uL (ref 4.0–10.5)

## 2018-05-02 LAB — HEPATIC FUNCTION PANEL
ALT: 14 U/L (ref 0–35)
AST: 17 U/L (ref 0–37)
Albumin: 4.4 g/dL (ref 3.5–5.2)
Alkaline Phosphatase: 63 U/L (ref 39–117)
BILIRUBIN DIRECT: 0.1 mg/dL (ref 0.0–0.3)
BILIRUBIN TOTAL: 0.7 mg/dL (ref 0.2–1.2)
Total Protein: 7.2 g/dL (ref 6.0–8.3)

## 2018-05-02 LAB — LIPID PANEL
CHOL/HDL RATIO: 3
Cholesterol: 212 mg/dL — ABNORMAL HIGH (ref 0–200)
HDL: 74.5 mg/dL (ref >39.00)
LDL CALC: 118 mg/dL — AB (ref 0–99)
NonHDL: 137.91
TRIGLYCERIDES: 101 mg/dL (ref 0.0–149.0)
VLDL: 20.2 mg/dL (ref 0.0–40.0)

## 2018-05-02 LAB — BASIC METABOLIC PANEL
BUN: 19 mg/dL (ref 6–23)
CALCIUM: 9.5 mg/dL (ref 8.4–10.5)
CO2: 29 meq/L (ref 19–32)
CREATININE: 1.18 mg/dL (ref 0.40–1.20)
Chloride: 98 mEq/L (ref 96–112)
GFR: 45.47 mL/min — AB (ref >60.00)
GLUCOSE: 87 mg/dL (ref 70–99)
Potassium: 3.7 mEq/L (ref 3.5–5.1)
Sodium: 135 mEq/L (ref 135–145)

## 2018-05-02 LAB — TSH: TSH: 2.98 u[IU]/mL (ref 0.35–4.50)

## 2018-05-02 LAB — HEMOGLOBIN A1C: Hgb A1c MFr Bld: 5.5 % (ref 4.6–6.5)

## 2018-05-02 NOTE — Progress Notes (Signed)
Patient ID: Heidi Baldwin, female   DOB: 10-Feb-1924, 84 y.o.   MRN: [id number]   Subjective:    Patient ID: Heidi Baldwin, female    DOB: [birth year], 84 y.o.   MRN: [id number]  HPI  Patient here for her physical exam.  She reports she is doing relatively well.  Still with back and leg pain.  Also neuropathic pain.  Has tried various pain medications.  Takes tramadol.  Discussed using tylenol with tramadol.  Discussed further evaluation and possible referral to Dr Sharlet Salina. She declines. Overall feels is relatively stable on above regimen.  No chest pain.  Breathing stable.  No acid reflux.  No abdominal pain.  Bowels moving.  Handling stress.  States blood pressures averaging 129-139/60s.  Discussed renal cell CA.  She declines further scanning or w/up.     Past Medical History:  Diagnosis Date  . Acid reflux   . Allergy   . Arthritis   . Atrial fibrillation ([place])   . Heart disease   . Heart murmur   . Hx of actinic keratosis May 2015   under left breast  . Hypercholesterolemia   . Hypertension   . Hypothyroidism    Past Surgical History:  Procedure Laterality Date  . ABDOMINAL HYSTERECTOMY    . CHOLECYSTECTOMY    . CORNEAL TRANSPLANT     Right x 3 Left x1  . CYST REMOVAL TRUNK     Tail bone  . KNEE SURGERY Left   . PACEMAKER INSERTION  05/03/13   Family History  Problem Relation Age of Onset  . Diabetes Mother   . Heart disease Father   . Hyperlipidemia Father   . Heart disease Sister   . Arthritis Son    Social History   Socioeconomic History  . Marital status: Widowed    Spouse name: Not on file  . Number of children: Not on file  . Years of education: Not on file  . Highest education level: Not on file  Occupational History  . Not on file  Social Needs  . Financial resource strain: Not on file  . Food insecurity:    Worry: Not on file    Inability: Not on file  . Transportation needs:    Medical: Not on file    Non-medical: Not on file  Tobacco Use  .  Smoking status: Never Smoker  . Smokeless tobacco: Never Used  Substance and Sexual Activity  . Alcohol use: No    Alcohol/week: 0.0 standard drinks  . Drug use: No  . Sexual activity: Not on file  Lifestyle  . Physical activity:    Days per week: Not on file    Minutes per session: Not on file  . Stress: Not on file  Relationships  . Social connections:    Talks on phone: Not on file    Gets together: Not on file    Attends religious service: Not on file    Active member of club or organization: Not on file    Attends meetings of clubs or organizations: Not on file    Relationship status: Not on file  Other Topics Concern  . Not on file  Social History Narrative  . Not on file    Outpatient Encounter Medications as of 05/02/2018  Medication Sig  . amLODipine (NORVASC) 5 MG tablet TAKE 1 TABLET (5 MG TOTAL) BY MOUTH DAILY.  Marland Kitchen apixaban (ELIQUIS) 5 MG TABS tablet Take 5 mg by mouth 2 (two) times daily.   Marland Kitchen  levothyroxine (SYNTHROID, LEVOTHROID) 50 MCG tablet TAKE 1 TABLET BY MOUTH DAILY ON EMPTY STOMACH WITH GLASS OF WATER 30-60MINS BEFORE BREAKFAST  . losartan (COZAAR) 100 MG tablet TAKE 1 TABLET (100 MG TOTAL) BY MOUTH DAILY.  . pantoprazole (PROTONIX) 40 MG tablet TAKE 1 TABLET BY MOUTH TWICE A DAY (Patient taking differently: TAKE ONE TABLET BY MOUTH ONCE DAILY)  . spironolactone (ALDACTONE) 25 MG tablet TAKE 1/2 TABLET BY MOUTH EVERY DAY  . traMADol (ULTRAM) 50 MG tablet TAKE 1 TABLET BY MOUTH TWICE A DAY AS NEEDED   No facility-administered encounter medications on file as of 05/02/2018.     Review of Systems  Constitutional: Negative for appetite change and unexpected weight change.  HENT: Negative for congestion and sinus pressure.   Eyes: Negative for pain and visual disturbance.  Respiratory: Negative for cough, chest tightness and shortness of breath.   Cardiovascular: Negative for chest pain, palpitations and leg swelling.  Gastrointestinal: Negative for  abdominal pain, diarrhea, nausea and vomiting.  Genitourinary: Negative for difficulty urinating and dysuria.  Musculoskeletal: Positive for back pain. Negative for joint swelling.       Back and leg pain as outlined.    Skin: Negative for color change and rash.  Neurological: Negative for dizziness, light-headedness and headaches.  Hematological: Negative for adenopathy. Does not bruise/bleed easily.  Psychiatric/Behavioral: Negative for agitation and dysphoric mood.       Objective:    Physical Exam  Constitutional: She is oriented to person, place, and time. She appears well-developed and well-nourished. No distress.  HENT:  Nose: Nose normal.  Mouth/Throat: Oropharynx is clear and moist.  Eyes: Right eye exhibits no discharge. Left eye exhibits no discharge. No scleral icterus.  Neck: Neck supple. No thyromegaly present.  Cardiovascular: Normal rate and regular rhythm.  Pulmonary/Chest: Breath sounds normal. No accessory muscle usage. No tachypnea. No respiratory distress. She has no decreased breath sounds. She has no wheezes. She has no rhonchi. Right breast exhibits no inverted nipple, no mass, no nipple discharge and no tenderness (no axillary adenopathy). Left breast exhibits no inverted nipple, no mass, no nipple discharge and no tenderness (no axilarry adenopathy).  Abdominal: Soft. Bowel sounds are normal. There is no tenderness.  Musculoskeletal: She exhibits no edema or tenderness.  Lymphadenopathy:    She has no cervical adenopathy.  Neurological: She is alert and oriented to person, place, and time.  Skin: No rash noted. No erythema.  Psychiatric: She has a normal mood and affect. Her behavior is normal.    BP (!) 142/70   Pulse 70   Temp 97.8 F (36.6 C) (Oral)   Ht 5\' 3"  (1.6 m)   Wt 155 lb 3.2 oz (70.4 kg)   SpO2 97%   BMI 27.49 kg/m  Wt Readings from Last 3 Encounters:  05/02/18 155 lb 3.2 oz (70.4 kg)  12/27/17 154 lb 12.8 oz (70.2 kg)  09/23/17 154 lb  9.6 oz (70.1 kg)     Lab Results  Component Value Date   WBC 8.3 05/02/2018   HGB 13.6 05/02/2018   HCT 40.4 05/02/2018   PLT 244.0 05/02/2018   GLUCOSE 87 05/02/2018   CHOL 212 (H) 05/02/2018   TRIG 101.0 05/02/2018   HDL 74.50 05/02/2018   LDLCALC 118 (H) 05/02/2018   ALT 14 05/02/2018   AST 17 05/02/2018   NA 135 05/02/2018   K 3.7 05/02/2018   CL 98 05/02/2018   CREATININE 1.18 05/02/2018   BUN 19 05/02/2018  CO2 29 05/02/2018   TSH 2.98 05/02/2018   INR 1.04 04/19/2015   HGBA1C 5.5 05/02/2018       Assessment & Plan:   Problem List Items Addressed This Visit    Acid reflux    Controlled on current regimen.  Follow.        Arteriosclerosis of coronary artery    Followed by cardiology.        Atrial fibrillation ([place])    Has pacemaker in place.  Stable.  Followed by cardiology.        Back pain    Persistent, but feels is stable and manageable with current regimen.  Declines further w/up or evaluation.  Follow.        Benign essential HTN   Relevant Orders   CBC with Differential/Platelet (Completed)   TSH (Completed)   Basic metabolic panel (Completed)   Essential hypertension, benign    Slight elevation today.  Has been under good control.  Follow pressures.  Same medication regimen.        Health care maintenance    Physical today 05/02/18.  She declines mammogram or any further scans or testing.  Declines shingrx.        Hypercholesterolemia    Follow lipid panel and liver function tests.        Relevant Orders   Hepatic function panel (Completed)   Lipid panel (Completed)   Hypothyroidism    On thyroid replacement.  Follow tsh.        Pulmonary hypertension ([place])    Followed by cardiology.        Renal cell carcinoma ([place])    Has a known renal mass.  Continues to decline any further evaluation or work up.         Other Visit Diagnoses    Need for influenza vaccination    -  Primary   Relevant Orders   Flu vaccine HIGH DOSE  PF (Completed)   Hyperglycemia       Relevant Orders   Hemoglobin A1c (Completed)       Einar Pheasant, MD

## 2018-05-02 NOTE — Assessment & Plan Note (Addendum)
Physical today 05/02/18.  She declines mammogram or any further scans or testing.  Declines shingrx.

## 2018-05-04 ENCOUNTER — Encounter: Payer: Self-pay | Admitting: Internal Medicine

## 2018-05-04 NOTE — Assessment & Plan Note (Signed)
Has a known renal mass.  Continues to decline any further evaluation or work up.

## 2018-05-04 NOTE — Assessment & Plan Note (Signed)
Controlled on current regimen.  Follow.  

## 2018-05-04 NOTE — Assessment & Plan Note (Signed)
Persistent, but feels is stable and manageable with current regimen.  Declines further w/up or evaluation.  Follow.

## 2018-05-04 NOTE — Assessment & Plan Note (Signed)
Followed by cardiology 

## 2018-05-04 NOTE — Assessment & Plan Note (Signed)
Slight elevation today.  Has been under good control.  Follow pressures.  Same medication regimen.

## 2018-05-04 NOTE — Assessment & Plan Note (Signed)
Has pacemaker in place.  Stable.  Followed by cardiology.

## 2018-05-04 NOTE — Assessment & Plan Note (Signed)
Follow lipid panel and liver function tests.

## 2018-05-04 NOTE — Assessment & Plan Note (Signed)
On thyroid replacement.  Follow tsh.  

## 2018-05-06 ENCOUNTER — Encounter: Payer: Self-pay | Admitting: Internal Medicine

## 2018-05-13 DIAGNOSIS — I495 Sick sinus syndrome: Secondary | ICD-10-CM | POA: Diagnosis not present

## 2018-05-13 DIAGNOSIS — I340 Nonrheumatic mitral (valve) insufficiency: Secondary | ICD-10-CM | POA: Diagnosis not present

## 2018-05-13 DIAGNOSIS — I482 Chronic atrial fibrillation, unspecified: Secondary | ICD-10-CM | POA: Diagnosis not present

## 2018-05-13 DIAGNOSIS — I2510 Atherosclerotic heart disease of native coronary artery without angina pectoris: Secondary | ICD-10-CM | POA: Diagnosis not present

## 2018-05-13 DIAGNOSIS — I517 Cardiomegaly: Secondary | ICD-10-CM | POA: Diagnosis not present

## 2018-06-03 ENCOUNTER — Other Ambulatory Visit: Payer: Self-pay | Admitting: Internal Medicine

## 2018-06-17 ENCOUNTER — Other Ambulatory Visit: Payer: Self-pay | Admitting: Internal Medicine

## 2018-07-01 ENCOUNTER — Other Ambulatory Visit: Payer: Self-pay | Admitting: Internal Medicine

## 2018-08-03 ENCOUNTER — Other Ambulatory Visit: Payer: Self-pay | Admitting: Internal Medicine

## 2018-08-07 ENCOUNTER — Other Ambulatory Visit: Payer: Self-pay | Admitting: Internal Medicine

## 2018-08-07 NOTE — Telephone Encounter (Signed)
Last OV 05/02/2018 Next OV 09/25/18  Last refill 06/17/18 with 1 refill

## 2018-08-09 ENCOUNTER — Other Ambulatory Visit: Payer: Self-pay | Admitting: Internal Medicine

## 2018-08-29 ENCOUNTER — Other Ambulatory Visit: Payer: Self-pay | Admitting: Internal Medicine

## 2018-09-12 ENCOUNTER — Ambulatory Visit: Payer: Medicare HMO | Admitting: Internal Medicine

## 2018-09-25 ENCOUNTER — Encounter: Payer: Self-pay | Admitting: Internal Medicine

## 2018-09-25 ENCOUNTER — Ambulatory Visit (INDEPENDENT_AMBULATORY_CARE_PROVIDER_SITE_OTHER): Payer: Medicare HMO | Admitting: Internal Medicine

## 2018-09-25 VITALS — BP 148/78 | HR 74 | Temp 97.4°F | Resp 16 | Wt 152.6 lb

## 2018-09-25 DIAGNOSIS — I272 Pulmonary hypertension, unspecified: Secondary | ICD-10-CM | POA: Diagnosis not present

## 2018-09-25 DIAGNOSIS — R739 Hyperglycemia, unspecified: Secondary | ICD-10-CM

## 2018-09-25 DIAGNOSIS — I10 Essential (primary) hypertension: Secondary | ICD-10-CM

## 2018-09-25 DIAGNOSIS — M545 Low back pain: Secondary | ICD-10-CM | POA: Diagnosis not present

## 2018-09-25 DIAGNOSIS — K219 Gastro-esophageal reflux disease without esophagitis: Secondary | ICD-10-CM | POA: Diagnosis not present

## 2018-09-25 DIAGNOSIS — E038 Other specified hypothyroidism: Secondary | ICD-10-CM

## 2018-09-25 DIAGNOSIS — E7800 Pure hypercholesterolemia, unspecified: Secondary | ICD-10-CM | POA: Diagnosis not present

## 2018-09-25 DIAGNOSIS — I4891 Unspecified atrial fibrillation: Secondary | ICD-10-CM | POA: Diagnosis not present

## 2018-09-25 DIAGNOSIS — C641 Malignant neoplasm of right kidney, except renal pelvis: Secondary | ICD-10-CM | POA: Diagnosis not present

## 2018-09-25 DIAGNOSIS — G8929 Other chronic pain: Secondary | ICD-10-CM

## 2018-09-25 LAB — HEPATIC FUNCTION PANEL
ALT: 15 U/L (ref 0–35)
AST: 20 U/L (ref 0–37)
Albumin: 4.5 g/dL (ref 3.5–5.2)
Alkaline Phosphatase: 64 U/L (ref 39–117)
Bilirubin, Direct: 0.2 mg/dL (ref 0.0–0.3)
Total Bilirubin: 0.9 mg/dL (ref 0.2–1.2)
Total Protein: 7.3 g/dL (ref 6.0–8.3)

## 2018-09-25 LAB — BASIC METABOLIC PANEL
BUN: 22 mg/dL (ref 6–23)
CO2: 30 mEq/L (ref 19–32)
CREATININE: 1.12 mg/dL (ref 0.40–1.20)
Calcium: 10 mg/dL (ref 8.4–10.5)
Chloride: 98 mEq/L (ref 96–112)
GFR: 45.4 mL/min — ABNORMAL LOW (ref >60.00)
GLUCOSE: 84 mg/dL (ref 70–99)
Potassium: 4.8 mEq/L (ref 3.5–5.1)
Sodium: 135 mEq/L (ref 135–145)

## 2018-09-25 LAB — CBC WITH DIFFERENTIAL/PLATELET
Basophils Absolute: 0.1 10*3/uL (ref 0.0–0.1)
Basophils Relative: 1.1 % (ref 0.0–3.0)
Eosinophils Absolute: 0.1 10*3/uL (ref 0.0–0.7)
Eosinophils Relative: 1.1 % (ref 0.0–5.0)
HCT: 41.1 % (ref 36.0–46.0)
Hemoglobin: 13.9 g/dL (ref 12.0–15.0)
LYMPHS ABS: 1.9 10*3/uL (ref 0.7–4.0)
Lymphocytes Relative: 21.7 % (ref 12.0–46.0)
MCHC: 33.9 g/dL (ref 30.0–36.0)
MCV: 87.8 fl (ref 78.0–100.0)
Monocytes Absolute: 0.7 10*3/uL (ref 0.1–1.0)
Monocytes Relative: 7.7 % (ref 3.0–12.0)
Neutro Abs: 5.9 10*3/uL (ref 1.4–7.7)
Neutrophils Relative %: 68.4 % (ref 43.0–77.0)
Platelets: 215 10*3/uL (ref 150.0–400.0)
RBC: 4.68 Mil/uL (ref 3.87–5.11)
RDW: 12.9 % (ref 11.5–15.5)
WBC: 8.6 10*3/uL (ref 4.0–10.5)

## 2018-09-25 LAB — LIPID PANEL
CHOLESTEROL: 205 mg/dL — AB (ref 0–200)
HDL: 76.8 mg/dL (ref >39.00)
LDL Cholesterol: 111 mg/dL — ABNORMAL HIGH (ref 0–99)
NonHDL: 127.95
Total CHOL/HDL Ratio: 3
Triglycerides: 84 mg/dL (ref 0.0–149.0)
VLDL: 16.8 mg/dL (ref 0.0–40.0)

## 2018-09-25 LAB — HEMOGLOBIN A1C: Hgb A1c MFr Bld: 5.4 % (ref 4.6–6.5)

## 2018-09-25 NOTE — Telephone Encounter (Signed)
Please advised would it be Capsaicin  OTC?

## 2018-09-25 NOTE — Patient Instructions (Signed)
Alpha lipoic acid 600mg per day

## 2018-09-25 NOTE — Progress Notes (Signed)
Patient ID: Heidi Baldwin, female   DOB: 1924/01/18, 85 y.o.   MRN: [id number]   Subjective:    Patient ID: Heidi Baldwin, female    DOB: [birth year], 85 y.o.   MRN: [id number]  HPI  Patient here for a scheduled follow up.  She reports things are stable.  Still with increased pain - back and leg.  Takes tramadol.  Helps some.  Discussed further w/up and evaluation.  Declines.  Wants to monitor.  No chest pain.  Breathing stable.  No acid reflux.  No abdominal pain.  Bowels moving.  No urine change.  Discussed again regarding the renal mass.  She declines further w/up.    Past Medical History:  Diagnosis Date  . Acid reflux   . Allergy   . Arthritis   . Atrial fibrillation ([place])   . Heart disease   . Heart murmur   . Hx of actinic keratosis May 2015   under left breast  . Hypercholesterolemia   . Hypertension   . Hypothyroidism    Past Surgical History:  Procedure Laterality Date  . ABDOMINAL HYSTERECTOMY    . CHOLECYSTECTOMY    . CORNEAL TRANSPLANT     Right x 3 Left x1  . CYST REMOVAL TRUNK     Tail bone  . KNEE SURGERY Left   . PACEMAKER INSERTION  05/03/13   Family History  Problem Relation Age of Onset  . Diabetes Mother   . Heart disease Father   . Hyperlipidemia Father   . Heart disease Sister   . Arthritis Son    Social History   Socioeconomic History  . Marital status: Widowed    Spouse name: Not on file  . Number of children: Not on file  . Years of education: Not on file  . Highest education level: Not on file  Occupational History  . Not on file  Social Needs  . Financial resource strain: Not on file  . Food insecurity:    Worry: Not on file    Inability: Not on file  . Transportation needs:    Medical: Not on file    Non-medical: Not on file  Tobacco Use  . Smoking status: Never Smoker  . Smokeless tobacco: Never Used  Substance and Sexual Activity  . Alcohol use: No    Alcohol/week: 0.0 standard drinks  . Drug use: No  . Sexual activity: Not  on file  Lifestyle  . Physical activity:    Days per week: Not on file    Minutes per session: Not on file  . Stress: Not on file  Relationships  . Social connections:    Talks on phone: Not on file    Gets together: Not on file    Attends religious service: Not on file    Active member of club or organization: Not on file    Attends meetings of clubs or organizations: Not on file    Relationship status: Not on file  Other Topics Concern  . Not on file  Social History Narrative  . Not on file    Outpatient Encounter Medications as of 09/25/2018  Medication Sig  . amLODipine (NORVASC) 5 MG tablet TAKE 1 TABLET (5 MG TOTAL) BY MOUTH DAILY.  Marland Kitchen apixaban (ELIQUIS) 5 MG TABS tablet Take 5 mg by mouth 2 (two) times daily.   Marland Kitchen levothyroxine (SYNTHROID, LEVOTHROID) 50 MCG tablet TAKE 1 TABLET BY MOUTH DAILY ON EMPTY STOMACH WITH GLASS OF WATER 30-60MINS BEFORE BREAKFAST  .  losartan (COZAAR) 100 MG tablet TAKE 1 TABLET (100 MG TOTAL) BY MOUTH DAILY.  . pantoprazole (PROTONIX) 40 MG tablet TAKE 1 TABLET BY MOUTH TWICE A DAY  . spironolactone (ALDACTONE) 25 MG tablet TAKE 1/2 TABLET BY MOUTH EVERY DAY  . traMADol (ULTRAM) 50 MG tablet TAKE 1 TABLET BY MOUTH TWICE A DAY AS NEEDED   No facility-administered encounter medications on file as of 09/25/2018.     Review of Systems  Constitutional: Negative for appetite change and unexpected weight change.  HENT: Negative for congestion and sinus pressure.   Respiratory: Negative for cough, chest tightness and shortness of breath.   Cardiovascular: Negative for chest pain, palpitations and leg swelling.  Gastrointestinal: Negative for abdominal pain, diarrhea, nausea and vomiting.  Genitourinary: Negative for difficulty urinating and dysuria.  Musculoskeletal: Negative for joint swelling and myalgias.  Skin: Negative for color change and rash.  Neurological: Negative for dizziness, light-headedness and headaches.  Psychiatric/Behavioral: Negative  for agitation and dysphoric mood.       Objective:    Physical Exam Constitutional:      General: She is not in acute distress.    Appearance: Normal appearance.  HENT:     Nose: Nose normal. No congestion.     Mouth/Throat:     Pharynx: No oropharyngeal exudate or posterior oropharyngeal erythema.  Neck:     Musculoskeletal: Neck supple. No muscular tenderness.     Thyroid: No thyromegaly.  Cardiovascular:     Rate and Rhythm: Normal rate and regular rhythm.  Pulmonary:     Effort: No respiratory distress.     Breath sounds: Normal breath sounds. No wheezing.  Abdominal:     General: Bowel sounds are normal.     Palpations: Abdomen is soft.     Tenderness: There is no abdominal tenderness.  Musculoskeletal:        General: No swelling or tenderness.  Lymphadenopathy:     Cervical: No cervical adenopathy.  Skin:    Findings: No erythema or rash.  Neurological:     Mental Status: She is alert.  Psychiatric:        Mood and Affect: Mood normal.        Behavior: Behavior normal.     BP (!) 148/78   Pulse 74   Temp (!) 97.4 F (36.3 C) (Oral)   Resp 16   Wt 152 lb 9.6 oz (69.2 kg)   SpO2 96%   BMI 27.03 kg/m  Wt Readings from Last 3 Encounters:  09/25/18 152 lb 9.6 oz (69.2 kg)  05/02/18 155 lb 3.2 oz (70.4 kg)  12/27/17 154 lb 12.8 oz (70.2 kg)     Lab Results  Component Value Date   WBC 8.6 09/25/2018   HGB 13.9 09/25/2018   HCT 41.1 09/25/2018   PLT 215.0 09/25/2018   GLUCOSE 84 09/25/2018   CHOL 205 (H) 09/25/2018   TRIG 84.0 09/25/2018   HDL 76.80 09/25/2018   LDLCALC 111 (H) 09/25/2018   ALT 15 09/25/2018   AST 20 09/25/2018   NA 135 09/25/2018   K 4.8 09/25/2018   CL 98 09/25/2018   CREATININE 1.12 09/25/2018   BUN 22 09/25/2018   CO2 30 09/25/2018   TSH 2.98 05/02/2018   INR 1.04 04/19/2015   HGBA1C 5.4 09/25/2018       Assessment & Plan:   Problem List Items Addressed This Visit    Acid reflux    Controlled.        Atrial  fibrillation ([place]) - Primary    Followed by cardiology.  Has pacemaker in place.  Follow.  Stable.       Back pain    Persistent. Declines further w/up or evaluation.       Benign essential HTN    Recheck improved some.  Continue current medications.  Follow pressure.  Follow metabolic panel.       Relevant Orders   CBC with Differential/Platelet (Completed)   Basic metabolic panel (Completed)   Hypercholesterolemia    Follow lipid panel and liver function tests.        Relevant Orders   Hepatic function panel (Completed)   Lipid panel (Completed)   Hypothyroidism    On thyroid replacement.  Follow tsh.       Pulmonary hypertension ([place])    Followed by cardiology.       Renal cell carcinoma ([place])    Has a known renal mass.  Discussed again with her regarding further w/up and evaluation.  She declines.  Follow.         Other Visit Diagnoses    Hyperglycemia       Relevant Orders   Hemoglobin A1c (Completed)       Einar Pheasant, MD

## 2018-09-27 ENCOUNTER — Encounter: Payer: Self-pay | Admitting: Internal Medicine

## 2018-09-27 NOTE — Assessment & Plan Note (Signed)
Persistent. Declines further w/up or evaluation.

## 2018-09-27 NOTE — Assessment & Plan Note (Signed)
Has a known renal mass.  Discussed again with her regarding further w/up and evaluation.  She declines.  Follow.

## 2018-09-27 NOTE — Assessment & Plan Note (Signed)
Controlled.  

## 2018-09-27 NOTE — Assessment & Plan Note (Signed)
Recheck improved some.  Continue current medications.  Follow pressure.  Follow metabolic panel.

## 2018-09-27 NOTE — Assessment & Plan Note (Signed)
Follow lipid panel and liver function tests.

## 2018-09-27 NOTE — Assessment & Plan Note (Signed)
Followed by cardiology 

## 2018-09-27 NOTE — Assessment & Plan Note (Signed)
On thyroid replacement.  Follow tsh.  

## 2018-09-27 NOTE — Assessment & Plan Note (Signed)
Followed by cardiology.  Has pacemaker in place.  Follow.  Stable.  

## 2018-10-14 ENCOUNTER — Other Ambulatory Visit: Payer: Self-pay | Admitting: Internal Medicine

## 2018-11-12 ENCOUNTER — Other Ambulatory Visit: Payer: Self-pay | Admitting: Internal Medicine

## 2018-11-18 DIAGNOSIS — I495 Sick sinus syndrome: Secondary | ICD-10-CM | POA: Diagnosis not present

## 2018-12-05 NOTE — Telephone Encounter (Signed)
error 

## 2018-12-16 ENCOUNTER — Other Ambulatory Visit: Payer: Self-pay | Admitting: Internal Medicine

## 2018-12-19 NOTE — Telephone Encounter (Signed)
Last OV 09/25/2018 Next OV 01/26/2019 Last refill 10/14/2018

## 2018-12-19 NOTE — Telephone Encounter (Signed)
Pt's son calling to f/up on this request.

## 2018-12-20 ENCOUNTER — Encounter: Payer: Self-pay | Admitting: Internal Medicine

## 2019-01-19 ENCOUNTER — Other Ambulatory Visit: Payer: Self-pay | Admitting: Internal Medicine

## 2019-01-26 ENCOUNTER — Ambulatory Visit (INDEPENDENT_AMBULATORY_CARE_PROVIDER_SITE_OTHER): Payer: Medicare HMO | Admitting: Internal Medicine

## 2019-01-26 ENCOUNTER — Other Ambulatory Visit: Payer: Self-pay

## 2019-01-26 DIAGNOSIS — I2510 Atherosclerotic heart disease of native coronary artery without angina pectoris: Secondary | ICD-10-CM | POA: Diagnosis not present

## 2019-01-26 DIAGNOSIS — I4891 Unspecified atrial fibrillation: Secondary | ICD-10-CM

## 2019-01-26 DIAGNOSIS — I272 Pulmonary hypertension, unspecified: Secondary | ICD-10-CM | POA: Diagnosis not present

## 2019-01-26 DIAGNOSIS — M545 Low back pain, unspecified: Secondary | ICD-10-CM

## 2019-01-26 DIAGNOSIS — I10 Essential (primary) hypertension: Secondary | ICD-10-CM | POA: Diagnosis not present

## 2019-01-26 DIAGNOSIS — E7800 Pure hypercholesterolemia, unspecified: Secondary | ICD-10-CM

## 2019-01-26 DIAGNOSIS — K219 Gastro-esophageal reflux disease without esophagitis: Secondary | ICD-10-CM | POA: Diagnosis not present

## 2019-01-26 DIAGNOSIS — I495 Sick sinus syndrome: Secondary | ICD-10-CM | POA: Diagnosis not present

## 2019-01-26 DIAGNOSIS — E038 Other specified hypothyroidism: Secondary | ICD-10-CM | POA: Diagnosis not present

## 2019-01-26 DIAGNOSIS — G8929 Other chronic pain: Secondary | ICD-10-CM

## 2019-01-26 DIAGNOSIS — C641 Malignant neoplasm of right kidney, except renal pelvis: Secondary | ICD-10-CM

## 2019-01-26 MED ORDER — TRAMADOL HCL 50 MG PO TABS: 50.0000 mg | ORAL_TABLET | Freq: Two times a day (BID) | ORAL | 2 refills | Status: DC | PRN

## 2019-01-26 NOTE — Progress Notes (Signed)
Patient ID: Heidi Baldwin, female   DOB: [birth year], 85 y.o.   MRN: [id number]   Virtual Visit via telephone Note  This visit type was conducted due to national recommendations for restrictions regarding the COVID-19 pandemic (e.g. social distancing).  This format is felt to be most appropriate for this patient at this time.  All issues noted in this document were discussed and addressed.  No physical exam was performed (except for noted visual exam findings with Video Visits).   I connected with Heidi Baldwin by telephone and verified that I am speaking with the correct person using two identifiers. Location patient: home Location provider: work  Persons participating in the telephone visit: patient, provider  I discussed the limitations, risks, security and privacy concerns of performing an evaluation and management service by telephone and the availability of in person appointments. The patient expressed understanding and agreed to proceed.   Reason for visit: scheduled follow up  HPI: She reports she is doing relatively well.  Still with increased back/leg pain.  Takes tramadol.  Does take the edge off.  Unable to take antiinflammatories.  Needs something to help function.  No side effects with tramadol.  Tries to stay active.  No chest pain.  No sob.  No acid reflux.  Controlled.  No abdominal pain.  Bowels moving.  Sees cardiology.  Has pacemaker.  Reports blood pressure has been doing well.  Blood pressures averaging 120-130/60-70s.  Pulse 70-80s.     ROS: See pertinent positives and negatives per HPI.  Past Medical History:  Diagnosis Date  . Acid reflux   . Allergy   . Arthritis   . Atrial fibrillation ([place])   . Heart disease   . Heart murmur   . Hx of actinic keratosis May 2015   under left breast  . Hypercholesterolemia   . Hypertension   . Hypothyroidism     Past Surgical History:  Procedure Laterality Date  . ABDOMINAL HYSTERECTOMY    . CHOLECYSTECTOMY    . CORNEAL  TRANSPLANT     Right x 3 Left x1  . CYST REMOVAL TRUNK     Tail bone  . KNEE SURGERY Left   . PACEMAKER INSERTION  05/03/13    Family History  Problem Relation Age of Onset  . Diabetes Mother   . Heart disease Father   . Hyperlipidemia Father   . Heart disease Sister   . Arthritis Son     SOCIAL HX: reviewed.    Current Outpatient Medications:  .  amLODipine (NORVASC) 5 MG tablet, TAKE 1 TABLET BY MOUTH EVERY DAY, Disp: 90 tablet, Rfl: 1 .  apixaban (ELIQUIS) 5 MG TABS tablet, Take 5 mg by mouth 2 (two) times daily. , Disp: , Rfl:  .  levothyroxine (SYNTHROID, LEVOTHROID) 50 MCG tablet, TAKE 1 TABLET BY MOUTH DAILY ON EMPTY STOMACH WITH GLASS OF WATER 30-60MINS BEFORE BREAKFAST, Disp: 90 tablet, Rfl: 3 .  losartan (COZAAR) 100 MG tablet, TAKE 1 TABLET (100 MG TOTAL) BY MOUTH DAILY., Disp: 90 tablet, Rfl: 1 .  pantoprazole (PROTONIX) 40 MG tablet, TAKE 1 TABLET BY MOUTH TWICE A DAY, Disp: 180 tablet, Rfl: 1 .  spironolactone (ALDACTONE) 25 MG tablet, TAKE 1/2 TABLET BY MOUTH EVERY DAY, Disp: 45 tablet, Rfl: 3 .  traMADol (ULTRAM) 50 MG tablet, Take 1 tablet (50 mg total) by mouth 2 (two) times daily as needed., Disp: 60 tablet, Rfl: 2  EXAM:  VITALS per patient if applicable:  409/81, 87 and  130/68, 78  GENERAL: alert. Sounds to be in no acute distress.  Answering questions appropriately.    PSYCH/NEURO: pleasant and cooperative, no obvious depression or anxiety, speech and thought processing grossly intact  ASSESSMENT AND PLAN:  Discussed the following assessment and plan:  Acid reflux Controlled.    Arteriosclerosis of coronary artery Followed by cardiology.   Atrial fibrillation Followed by cardiology.  Has pacemaker in place.  Follow.  Stable.   Benign essential HTN Blood pressure under good control.  Follow pressures.  Follow metabolic panel.   Hypercholesterolemia Follow lipid panel.    Hypothyroidism On thyroid replacement.  Follow tsh.    Pulmonary  hypertension Followed by cardiology.   Renal cell carcinoma ([place]) Has a known renal mass.  Discussed with her again today regarding further w/up and evaluation.  She continues to decline.  Follow.    Sick sinus syndrome S/p pacemaker placement.  Followed by cardiology.   Back pain Back and leg pain as outlined.  Persistent.  Taking tramadol.  Unable to take antiinflammatories.  Needs tramadol to function.  Refilled tramadol.  She desires no further w/up or evaluation.        I discussed the assessment and treatment plan with the patient. The patient was provided an opportunity to ask questions and all were answered. The patient agreed with the plan and demonstrated an understanding of the instructions.   The patient was advised to call back or seek an in-person evaluation if the symptoms worsen or if the condition fails to improve as anticipated.  I provided 15 minutes of non-face-to-face time during this encounter.   Einar Pheasant, MD

## 2019-01-31 ENCOUNTER — Encounter: Payer: Self-pay | Admitting: Internal Medicine

## 2019-01-31 NOTE — Assessment & Plan Note (Signed)
Followed by cardiology 

## 2019-01-31 NOTE — Assessment & Plan Note (Signed)
On thyroid replacement.  Follow tsh.  

## 2019-01-31 NOTE — Assessment & Plan Note (Signed)
Back and leg pain as outlined.  Persistent.  Taking tramadol.  Unable to take antiinflammatories.  Needs tramadol to function.  Refilled tramadol.  She desires no further w/up or evaluation.

## 2019-01-31 NOTE — Assessment & Plan Note (Signed)
Followed by cardiology.  Has pacemaker in place.  Follow.  Stable.

## 2019-01-31 NOTE — Assessment & Plan Note (Signed)
Blood pressure under good control.  Follow pressures.  Follow metabolic panel.   

## 2019-01-31 NOTE — Assessment & Plan Note (Signed)
Follow lipid panel.   

## 2019-01-31 NOTE — Assessment & Plan Note (Signed)
Has a known renal mass.  Discussed with her again today regarding further w/up and evaluation.  She continues to decline.  Follow.

## 2019-01-31 NOTE — Assessment & Plan Note (Signed)
Controlled.  

## 2019-01-31 NOTE — Assessment & Plan Note (Signed)
S/p pacemaker placement.  Followed by cardiology.  

## 2019-03-17 DIAGNOSIS — I482 Chronic atrial fibrillation, unspecified: Secondary | ICD-10-CM | POA: Diagnosis not present

## 2019-03-17 DIAGNOSIS — E782 Mixed hyperlipidemia: Secondary | ICD-10-CM | POA: Diagnosis not present

## 2019-03-17 DIAGNOSIS — I517 Cardiomegaly: Secondary | ICD-10-CM | POA: Diagnosis not present

## 2019-03-17 DIAGNOSIS — I495 Sick sinus syndrome: Secondary | ICD-10-CM | POA: Diagnosis not present

## 2019-03-17 DIAGNOSIS — I2510 Atherosclerotic heart disease of native coronary artery without angina pectoris: Secondary | ICD-10-CM | POA: Diagnosis not present

## 2019-03-17 DIAGNOSIS — I10 Essential (primary) hypertension: Secondary | ICD-10-CM | POA: Diagnosis not present

## 2019-04-28 DIAGNOSIS — I495 Sick sinus syndrome: Secondary | ICD-10-CM | POA: Diagnosis not present

## 2019-05-11 ENCOUNTER — Other Ambulatory Visit: Payer: Self-pay | Admitting: Internal Medicine

## 2019-05-15 ENCOUNTER — Other Ambulatory Visit: Payer: Self-pay | Admitting: Internal Medicine

## 2019-05-16 NOTE — Telephone Encounter (Signed)
rx ok'd for tramadol #60 with one refill.  

## 2019-05-29 ENCOUNTER — Other Ambulatory Visit: Payer: Self-pay

## 2019-05-29 ENCOUNTER — Ambulatory Visit (INDEPENDENT_AMBULATORY_CARE_PROVIDER_SITE_OTHER): Payer: Medicare HMO | Admitting: Internal Medicine

## 2019-05-29 ENCOUNTER — Other Ambulatory Visit: Payer: Self-pay | Admitting: Internal Medicine

## 2019-05-29 VITALS — BP 136/60 | HR 72 | Temp 96.8°F | Resp 16 | Ht 63.0 in | Wt 152.4 lb

## 2019-05-29 DIAGNOSIS — E7800 Pure hypercholesterolemia, unspecified: Secondary | ICD-10-CM

## 2019-05-29 DIAGNOSIS — N1832 Chronic kidney disease, stage 3b: Secondary | ICD-10-CM

## 2019-05-29 DIAGNOSIS — E038 Other specified hypothyroidism: Secondary | ICD-10-CM

## 2019-05-29 DIAGNOSIS — I4891 Unspecified atrial fibrillation: Secondary | ICD-10-CM

## 2019-05-29 DIAGNOSIS — G8929 Other chronic pain: Secondary | ICD-10-CM

## 2019-05-29 DIAGNOSIS — M545 Low back pain, unspecified: Secondary | ICD-10-CM

## 2019-05-29 DIAGNOSIS — I10 Essential (primary) hypertension: Secondary | ICD-10-CM | POA: Diagnosis not present

## 2019-05-29 DIAGNOSIS — I495 Sick sinus syndrome: Secondary | ICD-10-CM | POA: Diagnosis not present

## 2019-05-29 DIAGNOSIS — K219 Gastro-esophageal reflux disease without esophagitis: Secondary | ICD-10-CM

## 2019-05-29 DIAGNOSIS — Z23 Encounter for immunization: Secondary | ICD-10-CM

## 2019-05-29 DIAGNOSIS — C641 Malignant neoplasm of right kidney, except renal pelvis: Secondary | ICD-10-CM | POA: Diagnosis not present

## 2019-05-29 LAB — LIPID PANEL
Cholesterol: 223 mg/dL — ABNORMAL HIGH (ref 0–200)
HDL: 74.8 mg/dL (ref >39.00)
LDL Cholesterol: 114 mg/dL — ABNORMAL HIGH (ref 0–99)
NonHDL: 147.78
Total CHOL/HDL Ratio: 3
Triglycerides: 171 mg/dL — ABNORMAL HIGH (ref 0.0–149.0)
VLDL: 34.2 mg/dL (ref 0.0–40.0)

## 2019-05-29 LAB — HEPATIC FUNCTION PANEL
ALT: 13 U/L (ref 0–35)
AST: 18 U/L (ref 0–37)
Albumin: 4.5 g/dL (ref 3.5–5.2)
Alkaline Phosphatase: 66 U/L (ref 39–117)
Bilirubin, Direct: 0.1 mg/dL (ref 0.0–0.3)
Total Bilirubin: 0.5 mg/dL (ref 0.2–1.2)
Total Protein: 7.3 g/dL (ref 6.0–8.3)

## 2019-05-29 LAB — BASIC METABOLIC PANEL
BUN: 25 mg/dL — ABNORMAL HIGH (ref 6–23)
CO2: 28 mEq/L (ref 19–32)
Calcium: 9.6 mg/dL (ref 8.4–10.5)
Chloride: 97 mEq/L (ref 96–112)
Creatinine, Ser: 1.25 mg/dL — ABNORMAL HIGH (ref 0.40–1.20)
GFR: 39.94 mL/min — ABNORMAL LOW (ref >60.00)
Glucose, Bld: 104 mg/dL — ABNORMAL HIGH (ref 70–99)
Potassium: 3.9 mEq/L (ref 3.5–5.1)
Sodium: 134 mEq/L — ABNORMAL LOW (ref 135–145)

## 2019-05-29 LAB — TSH: TSH: 2.58 u[IU]/mL (ref 0.35–4.50)

## 2019-05-29 NOTE — Progress Notes (Signed)
Order placed for f/u labs.  

## 2019-05-29 NOTE — Progress Notes (Signed)
Patient ID: EULIA CHAPELL, female   DOB: 12/05/1923, 85 y.o.   MRN: [id number]   Subjective:    Patient ID: AMITA QUARLES, female    DOB: 10/22/1923, 85 y.o.   MRN: [id number]  HPI  Patient here for a scheduled follow up.  She report persistent back/leg pain.  Stable.  Has had extensive w/up previously. States "nothing can be done".  Discussed referral back.  She declines any further evaluation.  No chest pain.  No sob.  Saw Dr Nehemiah Massed - 03/17/19.  Stable.  Recommended f/u in 6 months. No acid reflux.  No abdominal pain.  Bowels moving.  Discussed renal cell carcinoma.  Desires no further w/up.      Past Medical History:  Diagnosis Date  . Acid reflux   . Allergy   . Arthritis   . Atrial fibrillation ([place])   . Heart disease   . Heart murmur   . Hx of actinic keratosis May 2015   under left breast  . Hypercholesterolemia   . Hypertension   . Hypothyroidism    Past Surgical History:  Procedure Laterality Date  . ABDOMINAL HYSTERECTOMY    . CHOLECYSTECTOMY    . CORNEAL TRANSPLANT     Right x 3 Left x1  . CYST REMOVAL TRUNK     Tail bone  . KNEE SURGERY Left   . PACEMAKER INSERTION  05/03/13   Family History  Problem Relation Age of Onset  . Diabetes Mother   . Heart disease Father   . Hyperlipidemia Father   . Heart disease Sister   . Arthritis Son    Social History   Socioeconomic History  . Marital status: Widowed    Spouse name: Not on file  . Number of children: Not on file  . Years of education: Not on file  . Highest education level: Not on file  Occupational History  . Not on file  Social Needs  . Financial resource strain: Not on file  . Food insecurity    Worry: Not on file    Inability: Not on file  . Transportation needs    Medical: Not on file    Non-medical: Not on file  Tobacco Use  . Smoking status: Never Smoker  . Smokeless tobacco: Never Used  Substance and Sexual Activity  . Alcohol use: No    Alcohol/week: 0.0 standard drinks  . Drug use:  No  . Sexual activity: Not on file  Lifestyle  . Physical activity    Days per week: Not on file    Minutes per session: Not on file  . Stress: Not on file  Relationships  . Social Herbalist on phone: Not on file    Gets together: Not on file    Attends religious service: Not on file    Active member of club or organization: Not on file    Attends meetings of clubs or organizations: Not on file    Relationship status: Not on file  Other Topics Concern  . Not on file  Social History Narrative  . Not on file    Outpatient Encounter Medications as of 05/29/2019  Medication Sig  . amLODipine (NORVASC) 5 MG tablet TAKE 1 TABLET BY MOUTH EVERY DAY  . apixaban (ELIQUIS) 5 MG TABS tablet Take 5 mg by mouth 2 (two) times daily.   Marland Kitchen levothyroxine (SYNTHROID, LEVOTHROID) 50 MCG tablet TAKE 1 TABLET BY MOUTH DAILY ON EMPTY STOMACH WITH GLASS OF WATER 30-60MINS  BEFORE BREAKFAST  . losartan (COZAAR) 100 MG tablet TAKE 1 TABLET BY MOUTH EVERY DAY  . pantoprazole (PROTONIX) 40 MG tablet TAKE 1 TABLET BY MOUTH TWICE A DAY  . spironolactone (ALDACTONE) 25 MG tablet TAKE 1/2 TABLET BY MOUTH EVERY DAY  . traMADol (ULTRAM) 50 MG tablet TAKE 1 TABLET (50 MG TOTAL) BY MOUTH 2 (TWO) TIMES DAILY AS NEEDED.   No facility-administered encounter medications on file as of 05/29/2019.    Review of Systems  Constitutional: Negative for appetite change and unexpected weight change.  HENT: Negative for congestion and sinus pressure.   Respiratory: Negative for cough, chest tightness and shortness of breath.   Cardiovascular: Negative for chest pain, palpitations and leg swelling.  Gastrointestinal: Negative for abdominal pain, diarrhea, nausea and vomiting.  Genitourinary: Negative for difficulty urinating and dysuria.  Musculoskeletal: Negative for joint swelling and myalgias.  Skin: Negative for color change and rash.  Neurological: Negative for dizziness, light-headedness and headaches.   Psychiatric/Behavioral: Negative for agitation and dysphoric mood.       Objective:    Physical Exam Constitutional:      General: She is not in acute distress.    Appearance: Normal appearance.  HENT:     Head: Normocephalic and atraumatic.     Right Ear: External ear normal.     Left Ear: External ear normal.  Eyes:     General: No scleral icterus.       Right eye: No discharge.        Left eye: No discharge.     Conjunctiva/sclera: Conjunctivae normal.  Neck:     Musculoskeletal: Neck supple. No muscular tenderness.     Thyroid: No thyromegaly.  Cardiovascular:     Rate and Rhythm: Normal rate and regular rhythm.  Pulmonary:     Effort: No respiratory distress.     Breath sounds: Normal breath sounds. No wheezing.  Abdominal:     General: Bowel sounds are normal.     Palpations: Abdomen is soft.     Tenderness: There is no abdominal tenderness.  Musculoskeletal:        General: No swelling or tenderness.  Lymphadenopathy:     Cervical: No cervical adenopathy.  Skin:    Findings: No erythema or rash.  Neurological:     Mental Status: She is alert.  Psychiatric:        Mood and Affect: Mood normal.        Behavior: Behavior normal.     BP 136/60   Pulse 72   Temp (!) 96.8 F (36 C)   Resp 16   Ht 5\' 3"  (1.6 m)   Wt 152 lb 6.4 oz (69.1 kg)   SpO2 99%   BMI 27.00 kg/m  Wt Readings from Last 3 Encounters:  05/29/19 152 lb 6.4 oz (69.1 kg)  01/26/19 150 lb (68 kg)  09/25/18 152 lb 9.6 oz (69.2 kg)     Lab Results  Component Value Date   WBC 8.6 09/25/2018   HGB 13.9 09/25/2018   HCT 41.1 09/25/2018   PLT 215.0 09/25/2018   GLUCOSE 104 (H) 05/29/2019   CHOL 223 (H) 05/29/2019   TRIG 171.0 (H) 05/29/2019   HDL 74.80 05/29/2019   LDLCALC 114 (H) 05/29/2019   ALT 13 05/29/2019   AST 18 05/29/2019   NA 134 (L) 05/29/2019   K 3.9 05/29/2019   CL 97 05/29/2019   CREATININE 1.25 (H) 05/29/2019   BUN 25 (H) 05/29/2019   CO2  28 05/29/2019   TSH  2.58 05/29/2019   INR 1.04 04/19/2015   HGBA1C 5.4 09/25/2018       Assessment & Plan:   Problem List Items Addressed This Visit    Acid reflux    Controlled.        Atrial fibrillation ([place])    Followed by cardiology.  Has pacemaker in place.  Stable.       Back pain    Persistent.  Takes tramadol prn.  Desires no further testing or evaluation.        Benign essential HTN    Blood pressure on recheck improved.  Continue current medication.  Follow pressures.  Follow metabolic panel.        Relevant Orders   TSH (Completed)   Basic metabolic panel (today) (Completed)   Hypercholesterolemia - Primary    Follow lipid panel.       Relevant Orders   Hepatic function panel (Completed)   Lipid panel (Completed)   Hypothyroidism    On thyroid replacement.  Follow tsh.       Renal cell carcinoma ([place])    Has a known renal mass.  Discussed with her today regarding further w/up and evaluation.  She declines.       Sick sinus syndrome ([place])    Pacemaker in place.         Other Visit Diagnoses    Need for pneumococcal vaccination       Relevant Orders   Pneumococcal polysaccharide vaccine 23-valent greater than or equal to 2yo subcutaneous/IM (Completed)       Einar Pheasant, MD

## 2019-06-01 ENCOUNTER — Encounter: Payer: Self-pay | Admitting: Internal Medicine

## 2019-06-01 NOTE — Assessment & Plan Note (Signed)
Has a known renal mass.  Discussed with her today regarding further w/up and evaluation.  She declines.

## 2019-06-01 NOTE — Assessment & Plan Note (Signed)
Persistent.  Takes tramadol prn.  Desires no further testing or evaluation.

## 2019-06-01 NOTE — Assessment & Plan Note (Signed)
Followed by cardiology.  Has pacemaker in place.  Stable.

## 2019-06-01 NOTE — Assessment & Plan Note (Signed)
Controlled.  

## 2019-06-01 NOTE — Assessment & Plan Note (Signed)
Follow lipid panel.   

## 2019-06-01 NOTE — Assessment & Plan Note (Signed)
Blood pressure on recheck improved.  Continue current medication.  Follow pressures.  Follow metabolic panel.

## 2019-06-01 NOTE — Assessment & Plan Note (Signed)
On thyroid replacement.  Follow tsh.  

## 2019-06-01 NOTE — Assessment & Plan Note (Signed)
Pacemaker in place

## 2019-06-02 ENCOUNTER — Other Ambulatory Visit: Payer: Self-pay | Admitting: Internal Medicine

## 2019-06-09 DIAGNOSIS — R69 Illness, unspecified: Secondary | ICD-10-CM | POA: Diagnosis not present

## 2019-06-22 ENCOUNTER — Other Ambulatory Visit: Payer: Self-pay

## 2019-06-23 ENCOUNTER — Other Ambulatory Visit: Payer: Self-pay

## 2019-06-23 ENCOUNTER — Other Ambulatory Visit (INDEPENDENT_AMBULATORY_CARE_PROVIDER_SITE_OTHER): Payer: Medicare HMO

## 2019-06-23 DIAGNOSIS — N1832 Chronic kidney disease, stage 3b: Secondary | ICD-10-CM | POA: Diagnosis not present

## 2019-06-24 LAB — BASIC METABOLIC PANEL
BUN: 23 mg/dL (ref 6–23)
CO2: 29 mEq/L (ref 19–32)
Calcium: 9.3 mg/dL (ref 8.4–10.5)
Chloride: 96 mEq/L (ref 96–112)
Creatinine, Ser: 1.56 mg/dL — ABNORMAL HIGH (ref 0.40–1.20)
GFR: 30.92 mL/min — ABNORMAL LOW (ref >60.00)
Glucose, Bld: 101 mg/dL — ABNORMAL HIGH (ref 70–99)
Potassium: 4.3 mEq/L (ref 3.5–5.1)
Sodium: 133 mEq/L — ABNORMAL LOW (ref 135–145)

## 2019-06-24 LAB — URINALYSIS, ROUTINE W REFLEX MICROSCOPIC
Bilirubin Urine: NEGATIVE
Ketones, ur: NEGATIVE
Nitrite: NEGATIVE
Specific Gravity, Urine: 1.015 (ref 1.000–1.030)
Total Protein, Urine: NEGATIVE
Urine Glucose: NEGATIVE
Urobilinogen, UA: 0.2 (ref 0.0–1.0)
pH: 6 (ref 5.0–8.0)

## 2019-06-25 ENCOUNTER — Other Ambulatory Visit: Payer: Medicare HMO

## 2019-06-25 ENCOUNTER — Telehealth: Payer: Self-pay

## 2019-06-25 DIAGNOSIS — R300 Dysuria: Secondary | ICD-10-CM

## 2019-06-25 NOTE — Telephone Encounter (Signed)
Order placed for urine culture 

## 2019-06-26 LAB — URINE CULTURE
MICRO NUMBER:: 1160096
SPECIMEN QUALITY:: ADEQUATE

## 2019-07-06 ENCOUNTER — Other Ambulatory Visit (INDEPENDENT_AMBULATORY_CARE_PROVIDER_SITE_OTHER): Payer: Medicare HMO

## 2019-07-06 ENCOUNTER — Other Ambulatory Visit: Payer: Self-pay

## 2019-07-06 ENCOUNTER — Encounter: Payer: Self-pay | Admitting: Internal Medicine

## 2019-07-06 ENCOUNTER — Telehealth: Payer: Self-pay | Admitting: *Deleted

## 2019-07-06 DIAGNOSIS — N1832 Chronic kidney disease, stage 3b: Secondary | ICD-10-CM | POA: Diagnosis not present

## 2019-07-06 LAB — BASIC METABOLIC PANEL
BUN: 13 mg/dL (ref 6–23)
CO2: 27 mEq/L (ref 19–32)
Calcium: 9.4 mg/dL (ref 8.4–10.5)
Chloride: 96 mEq/L (ref 96–112)
Creatinine, Ser: 1.04 mg/dL (ref 0.40–1.20)
GFR: 49.37 mL/min — ABNORMAL LOW (ref >60.00)
Glucose, Bld: 95 mg/dL (ref 70–99)
Potassium: 4.1 mEq/L (ref 3.5–5.1)
Sodium: 134 mEq/L — ABNORMAL LOW (ref 135–145)

## 2019-07-06 NOTE — Telephone Encounter (Signed)
Please place future orders for lab appt.   Has appt today

## 2019-07-06 NOTE — Telephone Encounter (Signed)
Order placed for lab 

## 2019-07-14 ENCOUNTER — Other Ambulatory Visit: Payer: Self-pay | Admitting: Internal Medicine

## 2019-07-15 NOTE — Telephone Encounter (Signed)
Refilled: 05/16/2019 Last OV: 05/29/2019 Next OV: 12/01/2019

## 2019-07-21 ENCOUNTER — Encounter: Payer: Self-pay | Admitting: Internal Medicine

## 2019-07-28 ENCOUNTER — Other Ambulatory Visit: Payer: Self-pay | Admitting: Internal Medicine

## 2019-09-11 ENCOUNTER — Telehealth: Payer: Self-pay | Admitting: Internal Medicine

## 2019-09-11 ENCOUNTER — Other Ambulatory Visit: Payer: Self-pay

## 2019-09-11 MED ORDER — PANTOPRAZOLE SODIUM 40 MG PO TBEC: 40.0000 mg | DELAYED_RELEASE_TABLET | Freq: Two times a day (BID) | ORAL | 1 refills | Status: DC

## 2019-09-11 NOTE — Telephone Encounter (Signed)
Rx sent in

## 2019-09-11 NOTE — Telephone Encounter (Signed)
Pt needs refill on pantoprazole (PROTONIX) 40 MG tablet. Please send to [facility] on [address] in [place]. Pt has switched from [facility]

## 2019-09-18 ENCOUNTER — Other Ambulatory Visit: Payer: Self-pay | Admitting: Internal Medicine

## 2019-09-18 MED ORDER — TRAMADOL HCL 50 MG PO TABS: ORAL_TABLET | ORAL | 1 refills | Status: DC

## 2019-09-18 NOTE — Telephone Encounter (Signed)
rx ok'd for tramadol #60 with one refill.  

## 2019-09-18 NOTE — Telephone Encounter (Signed)
Pt's son called said she is out of tramadol and the pharmacy told they faxed it over and have not heard from [place]. He said now she is completely out need to a prescription so she can get through the weekend. He would like a call back.

## 2019-09-18 NOTE — Telephone Encounter (Signed)
Last OV 05/29/19 Next OV 12/01/19 Last refill 07/15/19

## 2019-11-03 DIAGNOSIS — I495 Sick sinus syndrome: Secondary | ICD-10-CM | POA: Diagnosis not present

## 2019-11-05 DIAGNOSIS — I10 Essential (primary) hypertension: Secondary | ICD-10-CM | POA: Diagnosis not present

## 2019-11-05 DIAGNOSIS — I2510 Atherosclerotic heart disease of native coronary artery without angina pectoris: Secondary | ICD-10-CM | POA: Diagnosis not present

## 2019-11-05 DIAGNOSIS — I482 Chronic atrial fibrillation, unspecified: Secondary | ICD-10-CM | POA: Diagnosis not present

## 2019-11-05 DIAGNOSIS — E782 Mixed hyperlipidemia: Secondary | ICD-10-CM | POA: Diagnosis not present

## 2019-11-05 DIAGNOSIS — I272 Pulmonary hypertension, unspecified: Secondary | ICD-10-CM | POA: Diagnosis not present

## 2019-11-13 ENCOUNTER — Other Ambulatory Visit: Payer: Self-pay | Admitting: Internal Medicine

## 2019-11-14 NOTE — Telephone Encounter (Signed)
rx ok'd for tramadol #60 with one refill.  

## 2019-12-01 ENCOUNTER — Encounter: Payer: Self-pay | Admitting: Internal Medicine

## 2019-12-01 ENCOUNTER — Ambulatory Visit (INDEPENDENT_AMBULATORY_CARE_PROVIDER_SITE_OTHER): Payer: Medicare HMO | Admitting: Internal Medicine

## 2019-12-01 ENCOUNTER — Other Ambulatory Visit: Payer: Self-pay

## 2019-12-01 VITALS — BP 168/82 | HR 73 | Temp 97.2°F | Resp 16 | Ht 64.0 in | Wt 148.2 lb

## 2019-12-01 DIAGNOSIS — N183 Chronic kidney disease, stage 3 unspecified: Secondary | ICD-10-CM

## 2019-12-01 DIAGNOSIS — C649 Malignant neoplasm of unspecified kidney, except renal pelvis: Secondary | ICD-10-CM | POA: Diagnosis not present

## 2019-12-01 DIAGNOSIS — E7800 Pure hypercholesterolemia, unspecified: Secondary | ICD-10-CM | POA: Diagnosis not present

## 2019-12-01 DIAGNOSIS — I495 Sick sinus syndrome: Secondary | ICD-10-CM

## 2019-12-01 DIAGNOSIS — I10 Essential (primary) hypertension: Secondary | ICD-10-CM

## 2019-12-01 DIAGNOSIS — K219 Gastro-esophageal reflux disease without esophagitis: Secondary | ICD-10-CM | POA: Diagnosis not present

## 2019-12-01 DIAGNOSIS — I272 Pulmonary hypertension, unspecified: Secondary | ICD-10-CM

## 2019-12-01 DIAGNOSIS — I4891 Unspecified atrial fibrillation: Secondary | ICD-10-CM | POA: Diagnosis not present

## 2019-12-01 DIAGNOSIS — E038 Other specified hypothyroidism: Secondary | ICD-10-CM | POA: Diagnosis not present

## 2019-12-01 DIAGNOSIS — C641 Malignant neoplasm of right kidney, except renal pelvis: Secondary | ICD-10-CM

## 2019-12-01 LAB — BASIC METABOLIC PANEL
BUN: 17 mg/dL (ref 6–23)
CO2: 30 mEq/L (ref 19–32)
Calcium: 9.7 mg/dL (ref 8.4–10.5)
Chloride: 95 mEq/L — ABNORMAL LOW (ref 96–112)
Creatinine, Ser: 0.98 mg/dL (ref 0.40–1.20)
GFR: 52.83 mL/min — ABNORMAL LOW (ref >60.00)
Glucose, Bld: 99 mg/dL (ref 70–99)
Potassium: 4.1 mEq/L (ref 3.5–5.1)
Sodium: 130 mEq/L — ABNORMAL LOW (ref 135–145)

## 2019-12-01 LAB — LIPID PANEL
Cholesterol: 205 mg/dL — ABNORMAL HIGH (ref 0–200)
HDL: 66.8 mg/dL (ref >39.00)
LDL Cholesterol: 122 mg/dL — ABNORMAL HIGH (ref 0–99)
NonHDL: 138.06
Total CHOL/HDL Ratio: 3
Triglycerides: 79 mg/dL (ref 0.0–149.0)
VLDL: 15.8 mg/dL (ref 0.0–40.0)

## 2019-12-01 LAB — CBC WITH DIFFERENTIAL/PLATELET
Basophils Absolute: 0.1 10*3/uL (ref 0.0–0.1)
Basophils Relative: 1.2 % (ref 0.0–3.0)
Eosinophils Absolute: 0.1 10*3/uL (ref 0.0–0.7)
Eosinophils Relative: 1.4 % (ref 0.0–5.0)
HCT: 39.2 % (ref 36.0–46.0)
Hemoglobin: 13.4 g/dL (ref 12.0–15.0)
Lymphocytes Relative: 15.6 % (ref 12.0–46.0)
Lymphs Abs: 1.2 10*3/uL (ref 0.7–4.0)
MCHC: 34.2 g/dL (ref 30.0–36.0)
MCV: 86.4 fl (ref 78.0–100.0)
Monocytes Absolute: 0.6 10*3/uL (ref 0.1–1.0)
Monocytes Relative: 8.1 % (ref 3.0–12.0)
Neutro Abs: 5.6 10*3/uL (ref 1.4–7.7)
Neutrophils Relative %: 73.7 % (ref 43.0–77.0)
Platelets: 205 10*3/uL (ref 150.0–400.0)
RBC: 4.54 Mil/uL (ref 3.87–5.11)
RDW: 13.5 % (ref 11.5–15.5)
WBC: 7.7 10*3/uL (ref 4.0–10.5)

## 2019-12-01 LAB — HEPATIC FUNCTION PANEL
ALT: 13 U/L (ref 0–35)
AST: 19 U/L (ref 0–37)
Albumin: 4.5 g/dL (ref 3.5–5.2)
Alkaline Phosphatase: 65 U/L (ref 39–117)
Bilirubin, Direct: 0.2 mg/dL (ref 0.0–0.3)
Total Bilirubin: 0.8 mg/dL (ref 0.2–1.2)
Total Protein: 7.3 g/dL (ref 6.0–8.3)

## 2019-12-01 NOTE — Progress Notes (Signed)
Patient ID: Heidi Baldwin, female   DOB: 02/28/1924, 86 y.o.   MRN: [id number]   Subjective:    Patient ID: Heidi Baldwin, female    DOB: [birth year], 86 y.o.   MRN: [id number]  HPI This visit occurred during the SARS-CoV-2 public health emergency.  Safety protocols were in place, including screening questions prior to the visit, additional usage of staff PPE, and extensive cleaning of exam room while observing appropriate contact time as indicated for disinfecting solutions.  Patient here for a scheduled follow up. Here to f/u regarding her blood pressure and chronic pain.  Still with increased pain - back/legs.  Has seen various specialist through the years.  Desires no further intervention.  Does take tramadol .  Recently losartan was stopped - due to worsening renal function.  Blood pressure elevated at f/u with cardiology.  Started on coreg.  She feels has been swelling more since stopping losartan.  No chest pain.  Breathing overall stable.  Discussed avoiding antiinflammatories.  No nausea or vomiting reported.  No abdominal pain.  Bowels moving.    Past Medical History:  Diagnosis Date  . Acid reflux   . Allergy   . Arthritis   . Atrial fibrillation ([place])   . Heart disease   . Heart murmur   . Hx of actinic keratosis May 2015   under left breast  . Hypercholesterolemia   . Hypertension   . Hypothyroidism    Past Surgical History:  Procedure Laterality Date  . ABDOMINAL HYSTERECTOMY    . CHOLECYSTECTOMY    . CORNEAL TRANSPLANT     Right x 3 Left x1  . CYST REMOVAL TRUNK     Tail bone  . KNEE SURGERY Left   . PACEMAKER INSERTION  05/03/13   Family History  Problem Relation Age of Onset  . Diabetes Mother   . Heart disease Father   . Hyperlipidemia Father   . Heart disease Sister   . Arthritis Son    Social History   Socioeconomic History  . Marital status: Widowed    Spouse name: Not on file  . Number of children: Not on file  . Years of education: Not on file  .  Highest education level: Not on file  Occupational History  . Not on file  Tobacco Use  . Smoking status: Never Smoker  . Smokeless tobacco: Never Used  Substance and Sexual Activity  . Alcohol use: No    Alcohol/week: 0.0 standard drinks  . Drug use: No  . Sexual activity: Not on file  Other Topics Concern  . Not on file  Social History Narrative  . Not on file   Social Determinants of Health   Financial Resource Strain:   . Difficulty of Paying Living Expenses:   Food Insecurity:   . Worried About Charity fundraiser in the Last Year:   . Arboriculturist in the Last Year:   Transportation Needs:   . Film/video editor (Medical):   Marland Kitchen Lack of Transportation (Non-Medical):   Physical Activity:   . Days of Exercise per Week:   . Minutes of Exercise per Session:   Stress:   . Feeling of Stress :   Social Connections:   . Frequency of Communication with Friends and Family:   . Frequency of Social Gatherings with Friends and Family:   . Attends Religious Services:   . Active Member of Clubs or Organizations:   . Attends Archivist Meetings:   .  Marital Status:     Outpatient Encounter Medications as of 12/01/2019  Medication Sig  . carvedilol (COREG) 3.125 MG tablet Take 1 tablet by mouth in the morning and at bedtime.  Marland Kitchen apixaban (ELIQUIS) 5 MG TABS tablet Take 5 mg by mouth 2 (two) times daily.   Marland Kitchen levothyroxine (SYNTHROID) 50 MCG tablet TAKE 1 TABLET BY MOUTH DAILY ON EMPTY STOMACH WITH GLASS OF WATER 30-60MINS BEFORE BREAKFAST  . pantoprazole (PROTONIX) 40 MG tablet Take 1 tablet (40 mg total) by mouth 2 (two) times daily.  . traMADol (ULTRAM) 50 MG tablet TAKE 1 TABLET BY MOUTH TWICE DAILY AS NEEDED  . [DISCONTINUED] amLODipine (NORVASC) 5 MG tablet TAKE 1 TABLET BY MOUTH EVERY DAY  . [DISCONTINUED] losartan (COZAAR) 100 MG tablet TAKE 1 TABLET BY MOUTH EVERY DAY  . [DISCONTINUED] spironolactone (ALDACTONE) 25 MG tablet TAKE 1/2 TABLET BY MOUTH EVERY DAY    No facility-administered encounter medications on file as of 12/01/2019.   Review of Systems  Constitutional: Negative for appetite change and unexpected weight change.  HENT: Negative for congestion and sinus pressure.   Respiratory: Negative for cough, chest tightness and shortness of breath.   Cardiovascular: Negative for chest pain and palpitations.       Some intermittent leg swelling as outlined.    Gastrointestinal: Negative for abdominal pain, diarrhea and nausea.  Genitourinary: Negative for difficulty urinating and dysuria.  Musculoskeletal: Positive for back pain. Negative for joint swelling and myalgias.  Skin: Negative for color change and rash.  Neurological: Negative for dizziness, light-headedness and headaches.  Psychiatric/Behavioral: Negative for agitation and dysphoric mood.       Objective:    Physical Exam Vitals reviewed.  Constitutional:      General: She is not in acute distress.    Appearance: Normal appearance.  HENT:     Head: Normocephalic and atraumatic.     Right Ear: External ear normal.     Left Ear: External ear normal.  Eyes:     General: No scleral icterus.       Right eye: No discharge.        Left eye: No discharge.  Neck:     Thyroid: No thyromegaly.  Cardiovascular:     Rate and Rhythm: Normal rate and regular rhythm.  Pulmonary:     Effort: No respiratory distress.     Breath sounds: Normal breath sounds. No wheezing.  Abdominal:     General: Bowel sounds are normal.     Palpations: Abdomen is soft.     Tenderness: There is no abdominal tenderness.  Musculoskeletal:        General: No swelling or tenderness.     Cervical back: Neck supple. No tenderness.  Lymphadenopathy:     Cervical: No cervical adenopathy.  Skin:    Findings: No erythema or rash.  Neurological:     Mental Status: She is alert.  Psychiatric:        Mood and Affect: Mood normal.        Behavior: Behavior normal.     BP (!) 168/82   Pulse 73   Temp  (!) 97.2 F (36.2 C)   Resp 16   Ht 5\' 4"  (1.626 m)   Wt 148 lb 3.2 oz (67.2 kg)   SpO2 98%   BMI 25.44 kg/m  Wt Readings from Last 3 Encounters:  12/01/19 148 lb 3.2 oz (67.2 kg)  05/29/19 152 lb 6.4 oz (69.1 kg)  01/26/19 150 lb (68 kg)  Lab Results  Component Value Date   WBC 7.7 12/01/2019   HGB 13.4 12/01/2019   HCT 39.2 12/01/2019   PLT 205.0 12/01/2019   GLUCOSE 91 12/04/2019   CHOL 205 (H) 12/01/2019   TRIG 79.0 12/01/2019   HDL 66.80 12/01/2019   LDLCALC 122 (H) 12/01/2019   ALT 13 12/01/2019   AST 19 12/01/2019   NA 136 12/04/2019   K 4.1 12/04/2019   CL 96 12/04/2019   CREATININE 1.11 (H) 12/04/2019   BUN 15 12/04/2019   CO2 27 12/04/2019   TSH 2.58 05/29/2019   INR 1.04 04/19/2015   HGBA1C 5.4 09/25/2018       Assessment & Plan:   Problem List Items Addressed This Visit    Acid reflux    Upper symptoms controlled on protonix.        Atrial fibrillation ([place])    Followed by cardiology.  Has pacemaker. On eliquis.  Follow.        Relevant Medications   carvedilol (COREG) 3.125 MG tablet   Chronic kidney disease, stage 3 unspecified    Worsening kidney function recently.  Off losartan.  Recheck metabolic panel today.  Avoid antiinflammatories.        Essential hypertension, benign    Blood pressure elevated.  Recheck improved, but still higher than goal (144/70).  Check metabolic panel.  Restart losartan 1/2 (100mg  tablet).  Has f/u planned with cardiology next week.        Relevant Medications   carvedilol (COREG) 3.125 MG tablet   Other Relevant Orders   CBC with Differential/Platelet (Completed)   Basic metabolic panel (Completed)   Hypercholesterolemia    Follow lipid panel.       Relevant Medications   carvedilol (COREG) 3.125 MG tablet   Other Relevant Orders   Hepatic function panel (Completed)   Lipid panel (Completed)   Hypothyroidism - Primary    On thyroid replacement.  Follow tsh.        Relevant Medications    carvedilol (COREG) 3.125 MG tablet   Pulmonary hypertension ([place])    Followed by cardiology.       Relevant Medications   carvedilol (COREG) 3.125 MG tablet   Renal cell carcinoma ([facility])    Has a known renal mass.  Discussed again regarding further w/up.  She declines.        Sick sinus syndrome ([place])    Pacemaker in place.  Followed by cardiology.        Relevant Medications   carvedilol (COREG) 3.125 MG tablet       Einar Pheasant, MD

## 2019-12-02 ENCOUNTER — Other Ambulatory Visit: Payer: Self-pay | Admitting: Internal Medicine

## 2019-12-02 ENCOUNTER — Other Ambulatory Visit: Payer: Self-pay

## 2019-12-02 DIAGNOSIS — E871 Hypo-osmolality and hyponatremia: Secondary | ICD-10-CM

## 2019-12-02 MED ORDER — SPIRONOLACTONE 25 MG PO TABS: 12.5000 mg | ORAL_TABLET | Freq: Every day | ORAL | 3 refills | Status: DC

## 2019-12-02 MED ORDER — AMLODIPINE BESYLATE 5 MG PO TABS: 5.0000 mg | ORAL_TABLET | Freq: Every day | ORAL | 1 refills | Status: DC

## 2019-12-02 NOTE — Progress Notes (Signed)
Order placed for f/u lab.   

## 2019-12-04 ENCOUNTER — Other Ambulatory Visit: Payer: Self-pay

## 2019-12-04 ENCOUNTER — Other Ambulatory Visit (INDEPENDENT_AMBULATORY_CARE_PROVIDER_SITE_OTHER): Payer: Medicare HMO

## 2019-12-04 DIAGNOSIS — E871 Hypo-osmolality and hyponatremia: Secondary | ICD-10-CM | POA: Diagnosis not present

## 2019-12-05 ENCOUNTER — Other Ambulatory Visit: Payer: Self-pay | Admitting: Internal Medicine

## 2019-12-05 DIAGNOSIS — N183 Chronic kidney disease, stage 3 unspecified: Secondary | ICD-10-CM

## 2019-12-05 LAB — BASIC METABOLIC PANEL
BUN/Creatinine Ratio: 14 (ref 12–28)
BUN: 15 mg/dL (ref 10–36)
CO2: 27 mmol/L (ref 20–29)
Calcium: 9.3 mg/dL (ref 8.7–10.3)
Chloride: 96 mmol/L (ref 96–106)
Creatinine, Ser: 1.11 mg/dL — ABNORMAL HIGH (ref 0.57–1.00)
GFR calc Af Amer: 49 mL/min/{1.73_m2} — ABNORMAL LOW (ref >59)
GFR calc non Af Amer: 43 mL/min/{1.73_m2} — ABNORMAL LOW (ref >59)
Glucose: 91 mg/dL (ref 65–99)
Potassium: 4.1 mmol/L (ref 3.5–5.2)
Sodium: 136 mmol/L (ref 134–144)

## 2019-12-05 NOTE — Progress Notes (Signed)
Order placed for f/u met b 

## 2019-12-06 ENCOUNTER — Encounter: Payer: Self-pay | Admitting: Internal Medicine

## 2019-12-06 NOTE — Assessment & Plan Note (Signed)
Has a known renal mass.  Discussed again regarding further w/up.  She declines.

## 2019-12-06 NOTE — Assessment & Plan Note (Signed)
Follow lipid panel.   

## 2019-12-06 NOTE — Assessment & Plan Note (Signed)
Followed by cardiology 

## 2019-12-06 NOTE — Assessment & Plan Note (Signed)
On thyroid replacement.  Follow tsh.  

## 2019-12-06 NOTE — Assessment & Plan Note (Signed)
Upper symptoms controlled on protonix.  

## 2019-12-06 NOTE — Assessment & Plan Note (Signed)
Pacemaker in place.  Followed by cardiology.  

## 2019-12-06 NOTE — Assessment & Plan Note (Addendum)
Blood pressure elevated.  Recheck improved, but still higher than goal (144/70).  Check metabolic panel.  Restart losartan 1/2 (100mg  tablet).  Has f/u planned with cardiology next week.

## 2019-12-06 NOTE — Assessment & Plan Note (Signed)
Worsening kidney function recently.  Off losartan.  Recheck metabolic panel today.  Avoid antiinflammatories.

## 2019-12-06 NOTE — Assessment & Plan Note (Signed)
Followed by cardiology.  Has pacemaker. On eliquis.  Follow.

## 2019-12-07 DIAGNOSIS — I517 Cardiomegaly: Secondary | ICD-10-CM | POA: Diagnosis not present

## 2019-12-07 DIAGNOSIS — I2510 Atherosclerotic heart disease of native coronary artery without angina pectoris: Secondary | ICD-10-CM | POA: Diagnosis not present

## 2019-12-07 DIAGNOSIS — I482 Chronic atrial fibrillation, unspecified: Secondary | ICD-10-CM | POA: Diagnosis not present

## 2019-12-08 ENCOUNTER — Telehealth: Payer: Self-pay | Admitting: Internal Medicine

## 2019-12-08 NOTE — Telephone Encounter (Signed)
Pt 's husband returned call

## 2019-12-08 NOTE — Telephone Encounter (Signed)
See result note.  

## 2019-12-17 DIAGNOSIS — I10 Essential (primary) hypertension: Secondary | ICD-10-CM | POA: Diagnosis not present

## 2019-12-17 DIAGNOSIS — I2510 Atherosclerotic heart disease of native coronary artery without angina pectoris: Secondary | ICD-10-CM | POA: Diagnosis not present

## 2019-12-17 DIAGNOSIS — I340 Nonrheumatic mitral (valve) insufficiency: Secondary | ICD-10-CM | POA: Diagnosis not present

## 2019-12-17 DIAGNOSIS — I495 Sick sinus syndrome: Secondary | ICD-10-CM | POA: Diagnosis not present

## 2019-12-17 DIAGNOSIS — I517 Cardiomegaly: Secondary | ICD-10-CM | POA: Diagnosis not present

## 2019-12-17 DIAGNOSIS — I482 Chronic atrial fibrillation, unspecified: Secondary | ICD-10-CM | POA: Diagnosis not present

## 2019-12-18 ENCOUNTER — Other Ambulatory Visit: Payer: Self-pay

## 2019-12-18 ENCOUNTER — Other Ambulatory Visit (INDEPENDENT_AMBULATORY_CARE_PROVIDER_SITE_OTHER): Payer: Medicare HMO

## 2019-12-18 DIAGNOSIS — N183 Chronic kidney disease, stage 3 unspecified: Secondary | ICD-10-CM

## 2019-12-18 LAB — BASIC METABOLIC PANEL
BUN: 15 mg/dL (ref 6–23)
CO2: 30 mEq/L (ref 19–32)
Calcium: 9.2 mg/dL (ref 8.4–10.5)
Chloride: 96 mEq/L (ref 96–112)
Creatinine, Ser: 0.97 mg/dL (ref 0.40–1.20)
GFR: 53.45 mL/min — ABNORMAL LOW (ref >60.00)
Glucose, Bld: 86 mg/dL (ref 70–99)
Potassium: 4.4 mEq/L (ref 3.5–5.1)
Sodium: 133 mEq/L — ABNORMAL LOW (ref 135–145)

## 2019-12-19 ENCOUNTER — Encounter: Payer: Self-pay | Admitting: Internal Medicine

## 2020-01-14 ENCOUNTER — Other Ambulatory Visit: Payer: Self-pay | Admitting: Internal Medicine

## 2020-01-14 NOTE — Telephone Encounter (Signed)
Refill request for tramadol, last seen 12-01-19, last filled 11-14-19.  Please advise.

## 2020-01-15 NOTE — Telephone Encounter (Signed)
rx ok'd for tramadol #60 with one refill.  

## 2020-02-01 DIAGNOSIS — L600 Ingrowing nail: Secondary | ICD-10-CM | POA: Diagnosis not present

## 2020-02-25 ENCOUNTER — Encounter: Payer: Self-pay | Admitting: Internal Medicine

## 2020-02-25 ENCOUNTER — Other Ambulatory Visit: Payer: Self-pay

## 2020-02-25 ENCOUNTER — Ambulatory Visit (INDEPENDENT_AMBULATORY_CARE_PROVIDER_SITE_OTHER): Payer: Medicare HMO | Admitting: Internal Medicine

## 2020-02-25 DIAGNOSIS — E7800 Pure hypercholesterolemia, unspecified: Secondary | ICD-10-CM

## 2020-02-25 DIAGNOSIS — G8929 Other chronic pain: Secondary | ICD-10-CM

## 2020-02-25 DIAGNOSIS — I272 Pulmonary hypertension, unspecified: Secondary | ICD-10-CM

## 2020-02-25 DIAGNOSIS — I4891 Unspecified atrial fibrillation: Secondary | ICD-10-CM

## 2020-02-25 DIAGNOSIS — I2510 Atherosclerotic heart disease of native coronary artery without angina pectoris: Secondary | ICD-10-CM

## 2020-02-25 DIAGNOSIS — I495 Sick sinus syndrome: Secondary | ICD-10-CM | POA: Diagnosis not present

## 2020-02-25 DIAGNOSIS — M545 Low back pain: Secondary | ICD-10-CM

## 2020-02-25 DIAGNOSIS — E038 Other specified hypothyroidism: Secondary | ICD-10-CM

## 2020-02-25 DIAGNOSIS — C641 Malignant neoplasm of right kidney, except renal pelvis: Secondary | ICD-10-CM | POA: Diagnosis not present

## 2020-02-25 DIAGNOSIS — N183 Chronic kidney disease, stage 3 unspecified: Secondary | ICD-10-CM | POA: Diagnosis not present

## 2020-02-25 DIAGNOSIS — I10 Essential (primary) hypertension: Secondary | ICD-10-CM | POA: Diagnosis not present

## 2020-02-25 NOTE — Progress Notes (Signed)
Patient ID: Heidi Baldwin, female   DOB: July 25, 1923, 86 y.o.   MRN: [id number]   Subjective:    Patient ID: Heidi Baldwin, female    DOB: 27-Jan-1924, 86 y.o.   MRN: [id number]  HPI This visit occurred during the SARS-CoV-2 public health emergency.  Safety protocols were in place, including screening questions prior to the visit, additional usage of staff PPE, and extensive cleaning of exam room while observing appropriate contact time as indicated for disinfecting solutions.  Patient here for a scheduled follow up. She reports things are relatively stable. Still with increased back/leg pain. Has seen various specialist.  Medical management.  On tramadol.  Does feel this helps.  Feels needs tid.  Wears off in the afternoon.  No chest pain or sob.  No acid reflux.  No abdominal pain.  Bowels moving.  Taking losartan.      Past Medical History:  Diagnosis Date  . Acid reflux   . Allergy   . Arthritis   . Atrial fibrillation ([place])   . Heart disease   . Heart murmur   . Hx of actinic keratosis May 2015   under left breast  . Hypercholesterolemia   . Hypertension   . Hypothyroidism    Past Surgical History:  Procedure Laterality Date  . ABDOMINAL HYSTERECTOMY    . CHOLECYSTECTOMY    . CORNEAL TRANSPLANT     Right x 3 Left x1  . CYST REMOVAL TRUNK     Tail bone  . KNEE SURGERY Left   . PACEMAKER INSERTION  05/03/13   Family History  Problem Relation Age of Onset  . Diabetes Mother   . Heart disease Father   . Hyperlipidemia Father   . Heart disease Sister   . Arthritis Son    Social History   Socioeconomic History  . Marital status: Widowed    Spouse name: Not on file  . Number of children: Not on file  . Years of education: Not on file  . Highest education level: Not on file  Occupational History  . Not on file  Tobacco Use  . Smoking status: Never Smoker  . Smokeless tobacco: Never Used  Substance and Sexual Activity  . Alcohol use: No    Alcohol/week: 0.0 standard  drinks  . Drug use: No  . Sexual activity: Not on file  Other Topics Concern  . Not on file  Social History Narrative  . Not on file   Social Determinants of Health   Financial Resource Strain:   . Difficulty of Paying Living Expenses:   Food Insecurity:   . Worried About Charity fundraiser in the Last Year:   . Arboriculturist in the Last Year:   Transportation Needs:   . Film/video editor (Medical):   Marland Kitchen Lack of Transportation (Non-Medical):   Physical Activity:   . Days of Exercise per Week:   . Minutes of Exercise per Session:   Stress:   . Feeling of Stress :   Social Connections:   . Frequency of Communication with Friends and Family:   . Frequency of Social Gatherings with Friends and Family:   . Attends Religious Services:   . Active Member of Clubs or Organizations:   . Attends Archivist Meetings:   Marland Kitchen Marital Status:     Outpatient Encounter Medications as of 02/25/2020  Medication Sig  . amLODipine (NORVASC) 5 MG tablet Take 1 tablet (5 mg total) by mouth daily.  Marland Kitchen  apixaban (ELIQUIS) 5 MG TABS tablet Take 5 mg by mouth 2 (two) times daily.   . carvedilol (COREG) 3.125 MG tablet Take 1 tablet by mouth in the morning and at bedtime.  Marland Kitchen levothyroxine (SYNTHROID) 50 MCG tablet TAKE 1 TABLET BY MOUTH DAILY ON EMPTY STOMACH WITH GLASS OF WATER 30-60MINS BEFORE BREAKFAST  . losartan (COZAAR) 100 MG tablet Take 50 mg by mouth daily.  . pantoprazole (PROTONIX) 40 MG tablet Take 1 tablet (40 mg total) by mouth 2 (two) times daily.  Marland Kitchen spironolactone (ALDACTONE) 25 MG tablet Take 0.5 tablets (12.5 mg total) by mouth daily.  . traMADol (ULTRAM) 50 MG tablet TAKE 1 TABLET BY MOUTH TWICE DAILY AS NEEDED   No facility-administered encounter medications on file as of 02/25/2020.    Review of Systems  Constitutional: Negative for appetite change and unexpected weight change.  HENT: Negative for congestion and sinus pressure.   Respiratory: Negative for cough,  chest tightness and shortness of breath.   Cardiovascular: Negative for chest pain, palpitations and leg swelling.  Gastrointestinal: Negative for abdominal pain, diarrhea, nausea and vomiting.  Genitourinary: Negative for difficulty urinating and dysuria.  Musculoskeletal: Positive for back pain. Negative for joint swelling.  Skin: Negative for color change and rash.  Neurological: Negative for dizziness, light-headedness and headaches.  Psychiatric/Behavioral: Negative for agitation and dysphoric mood.       Objective:    Physical Exam Vitals reviewed.  Constitutional:      General: She is not in acute distress.    Appearance: Normal appearance.  HENT:     Head: Normocephalic and atraumatic.     Right Ear: External ear normal.     Left Ear: External ear normal.  Eyes:     General: No scleral icterus.       Right eye: No discharge.        Left eye: No discharge.     Conjunctiva/sclera: Conjunctivae normal.  Neck:     Thyroid: No thyromegaly.  Cardiovascular:     Rate and Rhythm: Normal rate and regular rhythm.  Pulmonary:     Effort: No respiratory distress.     Breath sounds: Normal breath sounds. No wheezing.  Abdominal:     General: Bowel sounds are normal.     Palpations: Abdomen is soft.     Tenderness: There is no abdominal tenderness.  Musculoskeletal:        General: No swelling or tenderness.     Cervical back: Neck supple. No tenderness.  Lymphadenopathy:     Cervical: No cervical adenopathy.  Skin:    Findings: No erythema or rash.  Neurological:     Mental Status: She is alert.  Psychiatric:        Mood and Affect: Mood normal.        Behavior: Behavior normal.     BP 136/72   Pulse 80   Temp 98.1 F (36.7 C) (Oral)   Resp 16   Ht 5\' 4"  (1.626 m)   Wt 153 lb (69.4 kg)   SpO2 98%   BMI 26.26 kg/m  Wt Readings from Last 3 Encounters:  02/25/20 153 lb (69.4 kg)  12/01/19 148 lb 3.2 oz (67.2 kg)  05/29/19 152 lb 6.4 oz (69.1 kg)     Lab  Results  Component Value Date   WBC 7.7 12/01/2019   HGB 13.4 12/01/2019   HCT 39.2 12/01/2019   PLT 205.0 12/01/2019   GLUCOSE 86 12/18/2019   CHOL 205 (H) 12/01/2019  TRIG 79.0 12/01/2019   HDL 66.80 12/01/2019   LDLCALC 122 (H) 12/01/2019   ALT 13 12/01/2019   AST 19 12/01/2019   NA 133 (L) 12/18/2019   K 4.4 12/18/2019   CL 96 12/18/2019   CREATININE 0.97 12/18/2019   BUN 15 12/18/2019   CO2 30 12/18/2019   TSH 2.58 05/29/2019   INR 1.04 04/19/2015   HGBA1C 5.4 09/25/2018       Assessment & Plan:   Problem List Items Addressed This Visit    Arteriosclerosis of coronary artery    Followed by cardiology.  Stable.       Relevant Medications   losartan (COZAAR) 100 MG tablet   Atrial fibrillation ([place])    Followed by cardiology.  Pacemaker in place.  On eliquis.        Relevant Medications   losartan (COZAAR) 100 MG tablet   Back pain    Persistent increased pain.  Has been evaluated by ortho previously.  Medical management.  On tramadol.  Feels needs tid on some days.  Follow.  PDMP reviewed.        Chronic kidney disease, stage 3 unspecified    Avoid antiinflammatories.  Follow metabolic panel.       Relevant Orders   Basic metabolic panel   Essential hypertension, benign    Back on losartan 1/2 100mg  q day.  Blood pressure as outlined.  Continue losartan, coreg, aldactone and amlodipine.  Follow pressures.  Follow metabolic panel.       Relevant Medications   losartan (COZAAR) 100 MG tablet   Hypercholesterolemia    Follow lipid panel.       Relevant Medications   losartan (COZAAR) 100 MG tablet   Other Relevant Orders   Hepatic function panel   Lipid panel   Hypothyroidism    On thyroid replacement.  Follow tsh.       Pulmonary hypertension ([place])    Followed by cardiology.        Relevant Medications   losartan (COZAAR) 100 MG tablet   Renal cell carcinoma ([place])    Has a known renal mass.  Discussed again with her today regarding  further evaluation and follow up.  She continues to decline.        Sick sinus syndrome ([place])    Pacemaker in place.  Followed by cardiology.       Relevant Medications   losartan (COZAAR) 100 MG tablet       Einar Pheasant, MD

## 2020-02-26 ENCOUNTER — Encounter: Payer: Self-pay | Admitting: Internal Medicine

## 2020-02-27 ENCOUNTER — Encounter: Payer: Self-pay | Admitting: Internal Medicine

## 2020-02-27 NOTE — Assessment & Plan Note (Signed)
Followed by cardiology 

## 2020-02-27 NOTE — Assessment & Plan Note (Signed)
Has a known renal mass.  Discussed again with her today regarding further evaluation and follow up.  She continues to decline.

## 2020-02-27 NOTE — Assessment & Plan Note (Signed)
Follow lipid panel.   

## 2020-02-27 NOTE — Assessment & Plan Note (Signed)
Followed by cardiology. Stable.   

## 2020-02-27 NOTE — Assessment & Plan Note (Signed)
On thyroid replacement.  Follow tsh.  

## 2020-02-27 NOTE — Assessment & Plan Note (Signed)
Followed by cardiology.  Pacemaker in place.  On eliquis.

## 2020-02-27 NOTE — Assessment & Plan Note (Signed)
Persistent increased pain.  Has been evaluated by ortho previously.  Medical management.  On tramadol.  Feels needs tid on some days.  Follow.  PDMP reviewed.

## 2020-02-27 NOTE — Assessment & Plan Note (Signed)
Back on losartan 1/2 100mg  q day.  Blood pressure as outlined.  Continue losartan, coreg, aldactone and amlodipine.  Follow pressures.  Follow metabolic panel.

## 2020-02-27 NOTE — Assessment & Plan Note (Signed)
Avoid antiinflammatories.  Follow metabolic panel.  

## 2020-02-27 NOTE — Assessment & Plan Note (Signed)
Pacemaker in place.  Followed by cardiology.

## 2020-03-10 ENCOUNTER — Other Ambulatory Visit: Payer: Self-pay | Admitting: Internal Medicine

## 2020-04-05 DIAGNOSIS — I495 Sick sinus syndrome: Secondary | ICD-10-CM | POA: Diagnosis not present

## 2020-04-14 ENCOUNTER — Other Ambulatory Visit: Payer: Self-pay | Admitting: Internal Medicine

## 2020-04-15 NOTE — Telephone Encounter (Signed)
rx ok'd for tramadol #90 with one refill.

## 2020-04-21 ENCOUNTER — Other Ambulatory Visit: Payer: Self-pay

## 2020-04-21 ENCOUNTER — Other Ambulatory Visit (INDEPENDENT_AMBULATORY_CARE_PROVIDER_SITE_OTHER): Payer: Medicare HMO

## 2020-04-21 DIAGNOSIS — E7800 Pure hypercholesterolemia, unspecified: Secondary | ICD-10-CM

## 2020-04-21 DIAGNOSIS — N183 Chronic kidney disease, stage 3 unspecified: Secondary | ICD-10-CM | POA: Diagnosis not present

## 2020-04-21 LAB — BASIC METABOLIC PANEL
BUN: 17 mg/dL (ref 6–23)
CO2: 31 mEq/L (ref 19–32)
Calcium: 9.2 mg/dL (ref 8.4–10.5)
Chloride: 96 mEq/L (ref 96–112)
Creatinine, Ser: 1.09 mg/dL (ref 0.40–1.20)
GFR: 46.69 mL/min — ABNORMAL LOW (ref >60.00)
Glucose, Bld: 93 mg/dL (ref 70–99)
Potassium: 4.2 mEq/L (ref 3.5–5.1)
Sodium: 133 mEq/L — ABNORMAL LOW (ref 135–145)

## 2020-04-21 LAB — LIPID PANEL
Cholesterol: 191 mg/dL (ref 0–200)
HDL: 76.4 mg/dL (ref >39.00)
LDL Cholesterol: 98 mg/dL (ref 0–99)
NonHDL: 114.35
Total CHOL/HDL Ratio: 2
Triglycerides: 82 mg/dL (ref 0.0–149.0)
VLDL: 16.4 mg/dL (ref 0.0–40.0)

## 2020-04-21 LAB — HEPATIC FUNCTION PANEL
ALT: 12 U/L (ref 0–35)
AST: 16 U/L (ref 0–37)
Albumin: 4.3 g/dL (ref 3.5–5.2)
Alkaline Phosphatase: 72 U/L (ref 39–117)
Bilirubin, Direct: 0.2 mg/dL (ref 0.0–0.3)
Total Bilirubin: 0.8 mg/dL (ref 0.2–1.2)
Total Protein: 6.9 g/dL (ref 6.0–8.3)

## 2020-06-06 ENCOUNTER — Other Ambulatory Visit: Payer: Self-pay | Admitting: Internal Medicine

## 2020-06-17 ENCOUNTER — Other Ambulatory Visit: Payer: Self-pay | Admitting: Internal Medicine

## 2020-06-28 ENCOUNTER — Encounter: Payer: Self-pay | Admitting: Internal Medicine

## 2020-06-28 ENCOUNTER — Other Ambulatory Visit: Payer: Self-pay

## 2020-06-28 ENCOUNTER — Ambulatory Visit (INDEPENDENT_AMBULATORY_CARE_PROVIDER_SITE_OTHER): Payer: Medicare HMO | Admitting: Internal Medicine

## 2020-06-28 VITALS — BP 130/72 | HR 78 | Temp 97.8°F | Ht 64.0 in | Wt 151.0 lb

## 2020-06-28 DIAGNOSIS — I10 Essential (primary) hypertension: Secondary | ICD-10-CM | POA: Diagnosis not present

## 2020-06-28 DIAGNOSIS — N183 Chronic kidney disease, stage 3 unspecified: Secondary | ICD-10-CM

## 2020-06-28 DIAGNOSIS — E038 Other specified hypothyroidism: Secondary | ICD-10-CM | POA: Diagnosis not present

## 2020-06-28 DIAGNOSIS — E7800 Pure hypercholesterolemia, unspecified: Secondary | ICD-10-CM

## 2020-06-28 DIAGNOSIS — I4891 Unspecified atrial fibrillation: Secondary | ICD-10-CM

## 2020-06-28 DIAGNOSIS — Z23 Encounter for immunization: Secondary | ICD-10-CM | POA: Diagnosis not present

## 2020-06-28 DIAGNOSIS — I272 Pulmonary hypertension, unspecified: Secondary | ICD-10-CM | POA: Diagnosis not present

## 2020-06-28 DIAGNOSIS — I495 Sick sinus syndrome: Secondary | ICD-10-CM

## 2020-06-28 DIAGNOSIS — M545 Low back pain, unspecified: Secondary | ICD-10-CM

## 2020-06-28 DIAGNOSIS — G8929 Other chronic pain: Secondary | ICD-10-CM

## 2020-06-28 DIAGNOSIS — C649 Malignant neoplasm of unspecified kidney, except renal pelvis: Secondary | ICD-10-CM | POA: Diagnosis not present

## 2020-06-28 DIAGNOSIS — C641 Malignant neoplasm of right kidney, except renal pelvis: Secondary | ICD-10-CM | POA: Diagnosis not present

## 2020-06-28 NOTE — Progress Notes (Signed)
Patient ID: Heidi Baldwin, female   DOB: 10-05-1923, 86 y.o.   MRN: [id number]   Subjective:    Patient ID: APHRODITE Baldwin, female    DOB: 03-03-1924, 86 y.o.   MRN: [id number]  HPI This visit occurred during the SARS-CoV-2 public health emergency.  Safety protocols were in place, including screening questions prior to the visit, additional usage of staff PPE, and extensive cleaning of exam room while observing appropriate contact time as indicated for disinfecting solutions.  Patient here for a scheduled follow up.  Here to follow up regarding her blood pressure, afib and back pain.  She reports she is doing relatively well.  No chest pain or sob reported.  Saw Dr Nehemiah Massed 03/2020.  Stable.  No increased heart rate or palpitations reported.  No abdominal pain or bowel change reported.  She did catch her foot on the bed.  She feel on the mattress.  Did not hurt herself.  Uses a walker to ambulate.  Needs tramadol to control pain.  Not a surgical candidate.  Has had w/up for her back.  Needs tramadol to be able to function, etc.     Past Medical History:  Diagnosis Date  . Acid reflux   . Allergy   . Arthritis   . Atrial fibrillation ([place])   . Heart disease   . Heart murmur   . Hx of actinic keratosis May 2015   under left breast  . Hypercholesterolemia   . Hypertension   . Hypothyroidism    Past Surgical History:  Procedure Laterality Date  . ABDOMINAL HYSTERECTOMY    . CHOLECYSTECTOMY    . CORNEAL TRANSPLANT     Right x 3 Left x1  . CYST REMOVAL TRUNK     Tail bone  . KNEE SURGERY Left   . PACEMAKER INSERTION  05/03/13   Family History  Problem Relation Age of Onset  . Diabetes Mother   . Heart disease Father   . Hyperlipidemia Father   . Heart disease Sister   . Arthritis Son    Social History   Socioeconomic History  . Marital status: Widowed    Spouse name: Not on file  . Number of children: Not on file  . Years of education: Not on file  . Highest education level: Not  on file  Occupational History  . Not on file  Tobacco Use  . Smoking status: Never Smoker  . Smokeless tobacco: Never Used  Substance and Sexual Activity  . Alcohol use: No    Alcohol/week: 0.0 standard drinks  . Drug use: No  . Sexual activity: Not on file  Other Topics Concern  . Not on file  Social History Narrative  . Not on file   Social Determinants of Health   Financial Resource Strain: Not on file  Food Insecurity: Not on file  Transportation Needs: Not on file  Physical Activity: Not on file  Stress: Not on file  Social Connections: Not on file    Outpatient Encounter Medications as of 06/28/2020  Medication Sig  . amLODipine (NORVASC) 5 MG tablet TAKE 1 TABLET(5 MG) BY MOUTH DAILY  . apixaban (ELIQUIS) 5 MG TABS tablet Take 5 mg by mouth 2 (two) times daily.   . carvedilol (COREG) 3.125 MG tablet Take 1 tablet by mouth in the morning and at bedtime.  Marland Kitchen levothyroxine (SYNTHROID) 50 MCG tablet TAKE 1 TABLET BY MOUTH DAILY ON EMPTY STOMACH WITH GLASS OF WATER 30-60MINS BEFORE BREAKFAST  . losartan (  COZAAR) 100 MG tablet TAKE 1 TABLET BY MOUTH EVERY DAY  . pantoprazole (PROTONIX) 40 MG tablet Take 1 tablet (40 mg total) by mouth 2 (two) times daily.  Marland Kitchen spironolactone (ALDACTONE) 25 MG tablet Take 0.5 tablets (12.5 mg total) by mouth daily.  . [DISCONTINUED] traMADol (ULTRAM) 50 MG tablet TAKE 1 TABLET BY MOUTH 2 TO 3 TIMES DAILY AS NEEDED   No facility-administered encounter medications on file as of 06/28/2020.    Review of Systems  Constitutional: Negative for appetite change and unexpected weight change.  HENT: Negative for congestion and sinus pressure.   Respiratory: Negative for cough, chest tightness and shortness of breath.   Cardiovascular: Negative for chest pain, palpitations and leg swelling.  Gastrointestinal: Negative for abdominal pain, diarrhea, nausea and vomiting.  Genitourinary: Negative for difficulty urinating and dysuria.  Musculoskeletal:  Positive for back pain. Negative for myalgias.  Skin: Negative for color change and rash.  Neurological: Negative for dizziness, light-headedness and headaches.  Psychiatric/Behavioral: Negative for agitation and dysphoric mood.       Objective:    Physical Exam Vitals reviewed.  Constitutional:      General: She is not in acute distress.    Appearance: Normal appearance.  HENT:     Head: Normocephalic and atraumatic.     Right Ear: External ear normal.     Left Ear: External ear normal.     Mouth/Throat:     Mouth: Oropharynx is clear and moist.  Eyes:     General: No scleral icterus.       Right eye: No discharge.        Left eye: No discharge.     Conjunctiva/sclera: Conjunctivae normal.  Neck:     Thyroid: No thyromegaly.  Cardiovascular:     Rate and Rhythm: Normal rate and regular rhythm.  Pulmonary:     Effort: No respiratory distress.     Breath sounds: Normal breath sounds. No wheezing.  Abdominal:     General: Bowel sounds are normal.     Palpations: Abdomen is soft.     Tenderness: There is no abdominal tenderness.  Musculoskeletal:        General: No swelling, tenderness or edema.     Cervical back: Neck supple. No tenderness.  Lymphadenopathy:     Cervical: No cervical adenopathy.  Skin:    Findings: No erythema or rash.  Neurological:     Mental Status: She is alert.  Psychiatric:        Mood and Affect: Mood normal.        Behavior: Behavior normal.     BP 130/72   Pulse 78   Temp 97.8 F (36.6 C) (Oral)   Ht 5\' 4"  (1.626 m)   Wt 151 lb (68.5 kg)   SpO2 98%   BMI 25.92 kg/m  Wt Readings from Last 3 Encounters:  06/28/20 151 lb (68.5 kg)  02/25/20 153 lb (69.4 kg)  12/01/19 148 lb 3.2 oz (67.2 kg)     Lab Results  Component Value Date   WBC 7.7 12/01/2019   HGB 13.4 12/01/2019   HCT 39.2 12/01/2019   PLT 205.0 12/01/2019   GLUCOSE 93 04/21/2020   CHOL 191 04/21/2020   TRIG 82.0 04/21/2020   HDL 76.40 04/21/2020   LDLCALC 98  04/21/2020   ALT 12 04/21/2020   AST 16 04/21/2020   NA 133 (L) 04/21/2020   K 4.2 04/21/2020   CL 96 04/21/2020   CREATININE 1.09 04/21/2020  BUN 17 04/21/2020   CO2 31 04/21/2020   TSH 2.58 05/29/2019   INR 1.04 04/19/2015   HGBA1C 5.4 09/25/2018       Assessment & Plan:   Problem List Items Addressed This Visit    Sick sinus syndrome ([place])    Followed by cardiology.  Pacemaker in place.       Renal cell carcinoma ([place])    Has a known renal mass.  Declines further w/up.        Pulmonary hypertension ([place])    Followed by cardiology.       Hypothyroidism    On thyroid replacement.  Follow tsh.       Hypercholesterolemia    Follow lipid panel.        Essential hypertension, benign    On losartan, coreg, aldactone and amlodipine.  Follow pressures.  Follow metabolic panel.       Chronic kidney disease, stage 3 unspecified ([facility])    Avoid antiinflammatories.  Follow metabolic panel.       Back pain    Persistent increased pain.  Has been evaluated by ortho.  Medical management.  On tramadol.  Needs to help her function and get around.  Follow.       Atrial fibrillation ([place])    Pacemaker in place. On eliquis.  Followed by cardiology.        Other Visit Diagnoses    Need for immunization against influenza    -  Primary   Relevant Orders   Flu Vaccine QUAD High Dose([facility]) (Completed)       Einar Pheasant, MD

## 2020-07-01 ENCOUNTER — Other Ambulatory Visit: Payer: Self-pay | Admitting: Internal Medicine

## 2020-07-02 NOTE — Telephone Encounter (Signed)
rx sent in for tramadol #90 with one refill.   

## 2020-07-03 ENCOUNTER — Encounter: Payer: Self-pay | Admitting: Internal Medicine

## 2020-07-03 NOTE — Assessment & Plan Note (Signed)
On losartan, coreg, aldactone and amlodipine.  Follow pressures.  Follow metabolic panel.

## 2020-07-03 NOTE — Assessment & Plan Note (Signed)
Avoid antiinflammatories.  Follow metabolic panel.  

## 2020-07-03 NOTE — Assessment & Plan Note (Signed)
Has a known renal mass.  Declines further w/up.

## 2020-07-03 NOTE — Assessment & Plan Note (Signed)
Persistent increased pain.  Has been evaluated by ortho.  Medical management.  On tramadol.  Needs to help her function and get around.  Follow.

## 2020-07-03 NOTE — Assessment & Plan Note (Signed)
Followed by cardiology 

## 2020-07-03 NOTE — Assessment & Plan Note (Signed)
Followed by cardiology Pacemaker in place 

## 2020-07-03 NOTE — Assessment & Plan Note (Signed)
Pacemaker in place. On eliquis.  Followed by cardiology.

## 2020-07-03 NOTE — Assessment & Plan Note (Signed)
Follow lipid panel.   

## 2020-07-03 NOTE — Assessment & Plan Note (Signed)
On thyroid replacement.  Follow tsh.  

## 2020-08-02 ENCOUNTER — Other Ambulatory Visit: Payer: Self-pay | Admitting: Internal Medicine

## 2020-08-24 ENCOUNTER — Other Ambulatory Visit: Payer: Self-pay | Admitting: Internal Medicine

## 2020-09-06 DIAGNOSIS — I495 Sick sinus syndrome: Secondary | ICD-10-CM | POA: Diagnosis not present

## 2020-09-18 ENCOUNTER — Other Ambulatory Visit: Payer: Self-pay | Admitting: Internal Medicine

## 2020-09-19 ENCOUNTER — Other Ambulatory Visit: Payer: Self-pay | Admitting: Internal Medicine

## 2020-09-19 NOTE — Telephone Encounter (Signed)
rx ok'd for tramadol #90 with no refills.

## 2020-10-17 ENCOUNTER — Encounter: Payer: Self-pay | Admitting: Internal Medicine

## 2020-10-17 ENCOUNTER — Other Ambulatory Visit: Payer: Self-pay

## 2020-10-17 ENCOUNTER — Ambulatory Visit (INDEPENDENT_AMBULATORY_CARE_PROVIDER_SITE_OTHER): Payer: Medicare HMO | Admitting: Internal Medicine

## 2020-10-17 VITALS — BP 132/70 | HR 88 | Temp 97.4°F | Resp 16 | Ht 64.0 in | Wt 150.0 lb

## 2020-10-17 DIAGNOSIS — I10 Essential (primary) hypertension: Secondary | ICD-10-CM | POA: Diagnosis not present

## 2020-10-17 DIAGNOSIS — C641 Malignant neoplasm of right kidney, except renal pelvis: Secondary | ICD-10-CM | POA: Diagnosis not present

## 2020-10-17 DIAGNOSIS — E038 Other specified hypothyroidism: Secondary | ICD-10-CM

## 2020-10-17 DIAGNOSIS — I272 Pulmonary hypertension, unspecified: Secondary | ICD-10-CM | POA: Diagnosis not present

## 2020-10-17 DIAGNOSIS — E7800 Pure hypercholesterolemia, unspecified: Secondary | ICD-10-CM

## 2020-10-17 DIAGNOSIS — I495 Sick sinus syndrome: Secondary | ICD-10-CM

## 2020-10-17 DIAGNOSIS — I4891 Unspecified atrial fibrillation: Secondary | ICD-10-CM

## 2020-10-17 DIAGNOSIS — N183 Chronic kidney disease, stage 3 unspecified: Secondary | ICD-10-CM | POA: Diagnosis not present

## 2020-10-17 LAB — BASIC METABOLIC PANEL
BUN: 18 mg/dL (ref 6–23)
CO2: 28 mEq/L (ref 19–32)
Calcium: 9.7 mg/dL (ref 8.4–10.5)
Chloride: 96 mEq/L (ref 96–112)
Creatinine, Ser: 1.04 mg/dL (ref 0.40–1.20)
GFR: 45.83 mL/min — ABNORMAL LOW (ref >60.00)
Glucose, Bld: 108 mg/dL — ABNORMAL HIGH (ref 70–99)
Potassium: 4.4 mEq/L (ref 3.5–5.1)
Sodium: 133 mEq/L — ABNORMAL LOW (ref 135–145)

## 2020-10-17 LAB — CBC WITH DIFFERENTIAL/PLATELET
Basophils Absolute: 0.1 10*3/uL (ref 0.0–0.1)
Basophils Relative: 1 % (ref 0.0–3.0)
Eosinophils Absolute: 0.1 10*3/uL (ref 0.0–0.7)
Eosinophils Relative: 1.6 % (ref 0.0–5.0)
HCT: 40 % (ref 36.0–46.0)
Hemoglobin: 13.6 g/dL (ref 12.0–15.0)
Lymphocytes Relative: 15.3 % (ref 12.0–46.0)
Lymphs Abs: 1.2 10*3/uL (ref 0.7–4.0)
MCHC: 33.9 g/dL (ref 30.0–36.0)
MCV: 86.7 fl (ref 78.0–100.0)
Monocytes Absolute: 0.6 10*3/uL (ref 0.1–1.0)
Monocytes Relative: 8.5 % (ref 3.0–12.0)
Neutro Abs: 5.6 10*3/uL (ref 1.4–7.7)
Neutrophils Relative %: 73.6 % (ref 43.0–77.0)
Platelets: 254 10*3/uL (ref 150.0–400.0)
RBC: 4.62 Mil/uL (ref 3.87–5.11)
RDW: 12.9 % (ref 11.5–15.5)
WBC: 7.6 10*3/uL (ref 4.0–10.5)

## 2020-10-17 LAB — LIPID PANEL
Cholesterol: 201 mg/dL — ABNORMAL HIGH (ref 0–200)
HDL: 68.4 mg/dL (ref >39.00)
LDL Cholesterol: 118 mg/dL — ABNORMAL HIGH (ref 0–99)
NonHDL: 132.67
Total CHOL/HDL Ratio: 3
Triglycerides: 72 mg/dL (ref 0.0–149.0)
VLDL: 14.4 mg/dL (ref 0.0–40.0)

## 2020-10-17 LAB — HEPATIC FUNCTION PANEL
ALT: 13 U/L (ref 0–35)
AST: 18 U/L (ref 0–37)
Albumin: 4.4 g/dL (ref 3.5–5.2)
Alkaline Phosphatase: 72 U/L (ref 39–117)
Bilirubin, Direct: 0.2 mg/dL (ref 0.0–0.3)
Total Bilirubin: 1 mg/dL (ref 0.2–1.2)
Total Protein: 7 g/dL (ref 6.0–8.3)

## 2020-10-17 LAB — TSH: TSH: 2.18 u[IU]/mL (ref 0.35–4.50)

## 2020-10-17 NOTE — Progress Notes (Signed)
Patient ID: Heidi Baldwin, female   DOB: 06-Jan-1924, 87 y.o.   MRN: [id number]   Subjective:    Patient ID: Heidi Baldwin, female    DOB: 1923-11-15, 87 y.o.   MRN: [id number]  HPI This visit occurred during the SARS-CoV-2 public health emergency.  Safety protocols were in place, including screening questions prior to the visit, additional usage of staff PPE, and extensive cleaning of exam room while observing appropriate contact time as indicated for disinfecting solutions.  Patient here for a scheduled follow up.  Here to follow up regarding her blood pressure, cholesterol and heart rate.  She reports things are relatively stable.  Still with msk pain.  Takes tramadol.  This helps her to be able to get around.  Unable to function without.  Not a surgical candidate.  No chest pain or sob. No increased heart rate or palpitations.  No acid reflux or abdominal pain reported.  Bowels stable.  Uses her walker to ambulate.  No recent falls.     Past Medical History:  Diagnosis Date  . Acid reflux   . Allergy   . Arthritis   . Atrial fibrillation ([place])   . Heart disease   . Heart murmur   . Hx of actinic keratosis May 2015   under left breast  . Hypercholesterolemia   . Hypertension   . Hypothyroidism    Past Surgical History:  Procedure Laterality Date  . ABDOMINAL HYSTERECTOMY    . CHOLECYSTECTOMY    . CORNEAL TRANSPLANT     Right x 3 Left x1  . CYST REMOVAL TRUNK     Tail bone  . KNEE SURGERY Left   . PACEMAKER INSERTION  05/03/13   Family History  Problem Relation Age of Onset  . Diabetes Mother   . Heart disease Father   . Hyperlipidemia Father   . Heart disease Sister   . Arthritis Son    Social History   Socioeconomic History  . Marital status: Widowed    Spouse name: Not on file  . Number of children: Not on file  . Years of education: Not on file  . Highest education level: Not on file  Occupational History  . Not on file  Tobacco Use  . Smoking status: Never  Smoker  . Smokeless tobacco: Never Used  Substance and Sexual Activity  . Alcohol use: No    Alcohol/week: 0.0 standard drinks  . Drug use: No  . Sexual activity: Not on file  Other Topics Concern  . Not on file  Social History Narrative  . Not on file   Social Determinants of Health   Financial Resource Strain: Not on file  Food Insecurity: Not on file  Transportation Needs: Not on file  Physical Activity: Not on file  Stress: Not on file  Social Connections: Not on file    Outpatient Encounter Medications as of 10/17/2020  Medication Sig  . amLODipine (NORVASC) 5 MG tablet TAKE 1 TABLET(5 MG) BY MOUTH DAILY  . apixaban (ELIQUIS) 5 MG TABS tablet Take 5 mg by mouth 2 (two) times daily.   . carvedilol (COREG) 3.125 MG tablet Take 1 tablet by mouth in the morning and at bedtime.  Marland Kitchen levothyroxine (SYNTHROID) 50 MCG tablet TAKE 1 TABLET BY MOUTH DAILY ON AN EMPTY STOMACH WITH GLASS OF WATER 30-60 MINS BEFORE BREAKFAST.  Marland Kitchen losartan (COZAAR) 100 MG tablet TAKE 1 TABLET BY MOUTH EVERY DAY  . pantoprazole (PROTONIX) 40 MG tablet TAKE 1 TABLET(40  MG) BY MOUTH TWICE DAILY  . spironolactone (ALDACTONE) 25 MG tablet TAKE 1/2 TABLET(12.5 MG) BY MOUTH DAILY  . traMADol (ULTRAM) 50 MG tablet TAKE 1 TABLET BY MOUTH 2 TO 3 TIMES DAILY AS NEEDED   No facility-administered encounter medications on file as of 10/17/2020.    Review of Systems  Constitutional: Negative for appetite change and unexpected weight change.  HENT: Negative for congestion and sinus pressure.   Respiratory: Negative for cough, chest tightness and shortness of breath.   Cardiovascular: Negative for chest pain, palpitations and leg swelling.  Gastrointestinal: Negative for abdominal pain, diarrhea, nausea and vomiting.  Genitourinary: Negative for difficulty urinating and dysuria.  Musculoskeletal: Negative for myalgias.       Back/leg pain as outlined.    Skin: Negative for color change and rash.  Neurological:  Negative for dizziness, light-headedness and headaches.  Psychiatric/Behavioral: Negative for agitation and dysphoric mood.       Objective:    Physical Exam Vitals reviewed.  Constitutional:      General: She is not in acute distress.    Appearance: Normal appearance.  HENT:     Head: Normocephalic and atraumatic.     Right Ear: External ear normal.     Left Ear: External ear normal.  Eyes:     General: No scleral icterus.       Right eye: No discharge.        Left eye: No discharge.     Conjunctiva/sclera: Conjunctivae normal.  Neck:     Thyroid: No thyromegaly.  Cardiovascular:     Rate and Rhythm: Normal rate and regular rhythm.  Pulmonary:     Effort: No respiratory distress.     Breath sounds: Normal breath sounds. No wheezing.  Abdominal:     General: Bowel sounds are normal.     Palpations: Abdomen is soft.     Tenderness: There is no abdominal tenderness.  Musculoskeletal:        General: No swelling or tenderness.     Cervical back: Neck supple. No tenderness.  Lymphadenopathy:     Cervical: No cervical adenopathy.  Skin:    Findings: No erythema or rash.  Neurological:     Mental Status: She is alert.  Psychiatric:        Mood and Affect: Mood normal.        Behavior: Behavior normal.     BP 132/70   Pulse 88   Temp (!) 97.4 F (36.3 C) (Oral)   Resp 16   Ht 5\' 4"  (1.626 m)   Wt 150 lb (68 kg)   SpO2 98%   BMI 25.75 kg/m  Wt Readings from Last 3 Encounters:  10/17/20 150 lb (68 kg)  06/28/20 151 lb (68.5 kg)  02/25/20 153 lb (69.4 kg)     Lab Results  Component Value Date   WBC 7.6 10/17/2020   HGB 13.6 10/17/2020   HCT 40.0 10/17/2020   PLT 254.0 10/17/2020   GLUCOSE 108 (H) 10/17/2020   CHOL 201 (H) 10/17/2020   TRIG 72.0 10/17/2020   HDL 68.40 10/17/2020   LDLCALC 118 (H) 10/17/2020   ALT 13 10/17/2020   AST 18 10/17/2020   NA 133 (L) 10/17/2020   K 4.4 10/17/2020   CL 96 10/17/2020   CREATININE 1.04 10/17/2020   BUN 18  10/17/2020   CO2 28 10/17/2020   TSH 2.18 10/17/2020   INR 1.04 04/19/2015   HGBA1C 5.4 09/25/2018  Assessment & Plan:   Problem List Items Addressed This Visit    Atrial fibrillation ([place])    Continue eliquis.  Taking coreg. No increased heart rate or palpitations.  Pacemaker in place.        Chronic kidney disease, stage 3 unspecified ([place])    Continue to avoid antiinflammatories.  Stay hydrated.  Follow metabolic panel - check today.       Relevant Orders   Basic metabolic panel (Completed)   Essential hypertension, benign    Continue losartan, amlodipine, coreg and aldactone.  Blood pressure doing well.  Follow pressures.  Follow metabolic panel.       Hypercholesterolemia    Follow lipid panel. Check today.       Relevant Orders   CBC with Differential/Platelet (Completed)   Hepatic function panel (Completed)   Lipid panel (Completed)   TSH (Completed)   Hypothyroidism    On thyroid replacement.  Follow tsh.       Pulmonary hypertension ([place])    Followed by cardiology.       Renal cell carcinoma ([place]) - Primary    Has a known renal mass.  Discussed again today.  Declines further w/up and evaluation.       Sick sinus syndrome ([place])    Pacemaker in place.  Followed by cardiology.           Einar Pheasant, MD

## 2020-10-18 ENCOUNTER — Encounter: Payer: Self-pay | Admitting: Internal Medicine

## 2020-10-18 NOTE — Assessment & Plan Note (Signed)
Followed by cardiology 

## 2020-10-18 NOTE — Assessment & Plan Note (Signed)
Follow lipid panel. Check today.

## 2020-10-18 NOTE — Assessment & Plan Note (Signed)
Continue losartan, amlodipine, coreg and aldactone.  Blood pressure doing well.  Follow pressures.  Follow metabolic panel.

## 2020-10-18 NOTE — Assessment & Plan Note (Signed)
Has a known renal mass.  Discussed again today.  Declines further w/up and evaluation.

## 2020-10-18 NOTE — Assessment & Plan Note (Signed)
On thyroid replacement.  Follow tsh.  

## 2020-10-18 NOTE — Assessment & Plan Note (Signed)
Continue to avoid antiinflammatories.  Stay hydrated.  Follow metabolic panel - check today.

## 2020-10-18 NOTE — Assessment & Plan Note (Signed)
Pacemaker in place.  Followed by cardiology.

## 2020-10-18 NOTE — Assessment & Plan Note (Signed)
Continue eliquis.  Taking coreg. No increased heart rate or palpitations.  Pacemaker in place.   

## 2020-10-19 ENCOUNTER — Other Ambulatory Visit: Payer: Self-pay | Admitting: Internal Medicine

## 2020-10-19 NOTE — Telephone Encounter (Signed)
rx sent in for tramadol #90 with one refill.

## 2020-11-09 DIAGNOSIS — L600 Ingrowing nail: Secondary | ICD-10-CM | POA: Diagnosis not present

## 2020-11-09 DIAGNOSIS — L03031 Cellulitis of right toe: Secondary | ICD-10-CM | POA: Diagnosis not present

## 2020-11-14 DIAGNOSIS — I482 Chronic atrial fibrillation, unspecified: Secondary | ICD-10-CM | POA: Diagnosis not present

## 2020-11-14 DIAGNOSIS — I10 Essential (primary) hypertension: Secondary | ICD-10-CM | POA: Diagnosis not present

## 2020-11-14 DIAGNOSIS — I2510 Atherosclerotic heart disease of native coronary artery without angina pectoris: Secondary | ICD-10-CM | POA: Diagnosis not present

## 2020-11-14 DIAGNOSIS — I272 Pulmonary hypertension, unspecified: Secondary | ICD-10-CM | POA: Diagnosis not present

## 2020-11-14 DIAGNOSIS — E782 Mixed hyperlipidemia: Secondary | ICD-10-CM | POA: Diagnosis not present

## 2020-11-28 ENCOUNTER — Other Ambulatory Visit: Payer: Self-pay | Admitting: Internal Medicine

## 2020-12-27 ENCOUNTER — Other Ambulatory Visit: Payer: Self-pay | Admitting: Internal Medicine

## 2021-01-27 ENCOUNTER — Other Ambulatory Visit: Payer: Self-pay | Admitting: Family

## 2021-01-27 NOTE — Telephone Encounter (Signed)
Rx ok'd for tramadol #90 with 1 refill.

## 2021-01-27 NOTE — Telephone Encounter (Signed)
RX Refill:tramadol Last Seen:10-17-20 Last ordered:12-27-20

## 2021-02-07 DIAGNOSIS — I495 Sick sinus syndrome: Secondary | ICD-10-CM | POA: Diagnosis not present

## 2021-02-17 ENCOUNTER — Ambulatory Visit: Payer: Medicare HMO | Admitting: Internal Medicine

## 2021-03-28 ENCOUNTER — Other Ambulatory Visit: Payer: Self-pay | Admitting: Internal Medicine

## 2021-03-28 NOTE — Telephone Encounter (Signed)
RX Refill:tramadol Last Seen:10-17-20 Last ordered:01-27-21

## 2021-03-29 NOTE — Telephone Encounter (Signed)
Rx sent in for tramadol #90 with one refill.

## 2021-04-14 ENCOUNTER — Other Ambulatory Visit: Payer: Self-pay

## 2021-04-14 ENCOUNTER — Ambulatory Visit (INDEPENDENT_AMBULATORY_CARE_PROVIDER_SITE_OTHER): Payer: Medicare HMO | Admitting: Internal Medicine

## 2021-04-14 VITALS — BP 132/72 | HR 88 | Temp 97.9°F | Resp 16 | Ht 62.0 in | Wt 152.8 lb

## 2021-04-14 DIAGNOSIS — N1831 Chronic kidney disease, stage 3a: Secondary | ICD-10-CM | POA: Diagnosis not present

## 2021-04-14 DIAGNOSIS — E038 Other specified hypothyroidism: Secondary | ICD-10-CM | POA: Diagnosis not present

## 2021-04-14 DIAGNOSIS — I4891 Unspecified atrial fibrillation: Secondary | ICD-10-CM

## 2021-04-14 DIAGNOSIS — I495 Sick sinus syndrome: Secondary | ICD-10-CM

## 2021-04-14 DIAGNOSIS — E7800 Pure hypercholesterolemia, unspecified: Secondary | ICD-10-CM

## 2021-04-14 DIAGNOSIS — I10 Essential (primary) hypertension: Secondary | ICD-10-CM

## 2021-04-14 DIAGNOSIS — I272 Pulmonary hypertension, unspecified: Secondary | ICD-10-CM

## 2021-04-14 DIAGNOSIS — K5900 Constipation, unspecified: Secondary | ICD-10-CM

## 2021-04-14 DIAGNOSIS — M545 Low back pain, unspecified: Secondary | ICD-10-CM

## 2021-04-14 DIAGNOSIS — D6869 Other thrombophilia: Secondary | ICD-10-CM

## 2021-04-14 DIAGNOSIS — C641 Malignant neoplasm of right kidney, except renal pelvis: Secondary | ICD-10-CM

## 2021-04-14 DIAGNOSIS — K219 Gastro-esophageal reflux disease without esophagitis: Secondary | ICD-10-CM

## 2021-04-14 DIAGNOSIS — G8929 Other chronic pain: Secondary | ICD-10-CM

## 2021-04-14 LAB — HEPATIC FUNCTION PANEL
AG Ratio: 1.8 (calc) (ref 1.0–2.5)
ALT: 12 U/L (ref 6–29)
AST: 18 U/L (ref 10–35)
Albumin: 4.5 g/dL (ref 3.6–5.1)
Alkaline phosphatase (APISO): 65 U/L (ref 37–153)
Bilirubin, Direct: 0.2 mg/dL (ref 0.0–0.2)
Globulin: 2.5 g/dL (calc) (ref 1.9–3.7)
Indirect Bilirubin: 0.5 mg/dL (calc) (ref 0.2–1.2)
Total Bilirubin: 0.7 mg/dL (ref 0.2–1.2)
Total Protein: 7 g/dL (ref 6.1–8.1)

## 2021-04-14 LAB — BASIC METABOLIC PANEL
BUN/Creatinine Ratio: 18 (calc) (ref 6–22)
BUN: 21 mg/dL (ref 7–25)
CO2: 29 mmol/L (ref 20–32)
Calcium: 9.6 mg/dL (ref 8.6–10.4)
Chloride: 95 mmol/L — ABNORMAL LOW (ref 98–110)
Creat: 1.17 mg/dL — ABNORMAL HIGH (ref 0.60–0.95)
Glucose, Bld: 91 mg/dL (ref 65–99)
Potassium: 4.5 mmol/L (ref 3.5–5.3)
Sodium: 133 mmol/L — ABNORMAL LOW (ref 135–146)

## 2021-04-14 LAB — LIPID PANEL
Cholesterol: 200 mg/dL — ABNORMAL HIGH (ref <200)
HDL: 74 mg/dL (ref >=50)
LDL Cholesterol (Calc): 108 mg/dL (calc) — ABNORMAL HIGH
Non-HDL Cholesterol (Calc): 126 mg/dL (calc) (ref <130)
Total CHOL/HDL Ratio: 2.7 (calc) (ref <5.0)
Triglycerides: 90 mg/dL (ref <150)

## 2021-04-14 NOTE — Progress Notes (Signed)
Patient ID: Heidi Baldwin, female   DOB: [birth year], 87 y.o.   MRN: [id number]   Subjective:    Patient ID: Heidi Baldwin, female    DOB: Oct 01, 1923, 87 y.o.   MRN: [id number]  This visit occurred during the SARS-CoV-2 public health emergency.  Safety protocols were in place, including screening questions prior to the visit, additional usage of staff PPE, and extensive cleaning of exam room while observing appropriate contact time as indicated for disinfecting solutions.   Patient here for a scheduled follow up.  Chief Complaint  Patient presents with   Chronic Kidney Disease   Hypothyroidism   .   HPI She continues to have back/leg pain.  Persistent.  Takes tramadol to be able to move around, etc.  Has had extensive w/up and evaluation previously.  Desires no further testing at this time.  No chest pain.  Breathing stable.  No acid reflux reported.  No abdominal pain.  Some constipation.  Has had procedure - teeth/mouth.  She is eating.  Saw Dr Nehemiah Massed 10/2020.  Stable.    Past Medical History:  Diagnosis Date   Acid reflux    Allergy    Arthritis    Atrial fibrillation (HCC)    Heart disease    Heart murmur    Hx of actinic keratosis May 2015   under left breast   Hypercholesterolemia    Hypertension    Hypothyroidism    Past Surgical History:  Procedure Laterality Date   ABDOMINAL HYSTERECTOMY     CHOLECYSTECTOMY     CORNEAL TRANSPLANT     Right x 3 Left x1   CYST REMOVAL TRUNK     Tail bone   KNEE SURGERY Left    PACEMAKER INSERTION  05/03/13   Family History  Problem Relation Age of Onset   Diabetes Mother    Heart disease Father    Hyperlipidemia Father    Heart disease Sister    Arthritis Son    Social History   Socioeconomic History   Marital status: Widowed    Spouse name: Not on file   Number of children: Not on file   Years of education: Not on file   Highest education level: Not on file  Occupational History   Not on file  Tobacco Use   Smoking  status: Never   Smokeless tobacco: Never  Substance and Sexual Activity   Alcohol use: No    Alcohol/week: 0.0 standard drinks   Drug use: No   Sexual activity: Not on file  Other Topics Concern   Not on file  Social History Narrative   Not on file   Social Determinants of Health   Financial Resource Strain: Not on file  Food Insecurity: Not on file  Transportation Needs: Not on file  Physical Activity: Not on file  Stress: Not on file  Social Connections: Not on file     Review of Systems  Constitutional:  Negative for appetite change.       Eating.  No weight change recently.   HENT:  Negative for congestion and sinus pressure.   Respiratory:  Negative for cough and chest tightness.        Breathing stable.   Cardiovascular:  Negative for chest pain and palpitations.       No increased swelling.   Gastrointestinal:  Negative for abdominal pain, diarrhea, nausea and vomiting.  Genitourinary:  Negative for difficulty urinating and dysuria.  Musculoskeletal:  Positive for back pain. Negative for  joint swelling and myalgias.  Skin:  Negative for color change and rash.  Neurological:  Negative for dizziness, light-headedness and headaches.  Psychiatric/Behavioral:  Negative for agitation and dysphoric mood.       Objective:     BP 132/72   Pulse 88   Temp 97.9 F (36.6 C)   Resp 16   Ht 5\' 2"  (1.575 m)   Wt 152 lb 12.8 oz (69.3 kg)   SpO2 98%   BMI 27.95 kg/m  Wt Readings from Last 3 Encounters:  04/14/21 152 lb 12.8 oz (69.3 kg)  10/17/20 150 lb (68 kg)  06/28/20 151 lb (68.5 kg)    Physical Exam Vitals reviewed.  Constitutional:      General: She is not in acute distress.    Appearance: Normal appearance.  HENT:     Head: Normocephalic and atraumatic.     Right Ear: External ear normal.     Left Ear: External ear normal.  Eyes:     General: No scleral icterus.       Right eye: No discharge.        Left eye: No discharge.     Conjunctiva/sclera:  Conjunctivae normal.  Neck:     Thyroid: No thyromegaly.  Cardiovascular:     Rate and Rhythm: Normal rate and regular rhythm.  Pulmonary:     Effort: No respiratory distress.     Breath sounds: Normal breath sounds. No wheezing.  Abdominal:     General: Bowel sounds are normal.     Palpations: Abdomen is soft.     Tenderness: There is no abdominal tenderness.  Musculoskeletal:        General: No swelling or tenderness.     Cervical back: Neck supple. No tenderness.  Lymphadenopathy:     Cervical: No cervical adenopathy.  Skin:    Findings: No erythema or rash.  Neurological:     Mental Status: She is alert.  Psychiatric:        Mood and Affect: Mood normal.        Behavior: Behavior normal.     Outpatient Encounter Medications as of 04/14/2021  Medication Sig   amLODipine (NORVASC) 5 MG tablet TAKE 1 TABLET(5 MG) BY MOUTH DAILY   apixaban (ELIQUIS) 5 MG TABS tablet Take 5 mg by mouth 2 (two) times daily.    carvedilol (COREG) 3.125 MG tablet Take 1 tablet by mouth in the morning and at bedtime.   levothyroxine (SYNTHROID) 50 MCG tablet TAKE 1 TABLET BY MOUTH DAILY ON AN EMPTY STOMACH WITH GLASS OF WATER 30-60 MINS BEFORE BREAKFAST.   losartan (COZAAR) 100 MG tablet TAKE 1 TABLET BY MOUTH EVERY DAY   pantoprazole (PROTONIX) 40 MG tablet TAKE 1 TABLET(40 MG) BY MOUTH TWICE DAILY   spironolactone (ALDACTONE) 25 MG tablet TAKE 1/2 TABLET(12.5 MG) BY MOUTH DAILY   traMADol (ULTRAM) 50 MG tablet TAKE 1 TABLET BY MOUTH 2 TO 3 TIMES DAILY AS NEEDED   No facility-administered encounter medications on file as of 04/14/2021.     Lab Results  Component Value Date   WBC 7.6 10/17/2020   HGB 13.6 10/17/2020   HCT 40.0 10/17/2020   PLT 254.0 10/17/2020   GLUCOSE 91 04/14/2021   CHOL 200 (H) 04/14/2021   TRIG 90 04/14/2021   HDL 74 04/14/2021   LDLCALC 108 (H) 04/14/2021   ALT 12 04/14/2021   AST 18 04/14/2021   NA 133 (L) 04/14/2021   K 4.5 04/14/2021   CL 95 (  L) 04/14/2021    CREATININE 1.17 (H) 04/14/2021   BUN 21 04/14/2021   CO2 29 04/14/2021   TSH 2.18 10/17/2020   INR 1.04 04/19/2015   HGBA1C 5.4 09/25/2018       Assessment & Plan:   Problem List Items Addressed This Visit     Acid reflux    Upper symptoms controlled on protonix.       Atrial fibrillation ([place])    Continue eliquis.  Taking coreg. No increased heart rate or palpitations.  Pacemaker in place.        Back pain    Persistent increased pain.  Has been evaluated by ortho.  Medical management.  On tramadol.  Needs to help her function and get around.  Follow.       Chronic kidney disease, stage 3 unspecified ([place])    Continue to avoid antiinflammatories.  Stay hydrated.  Follow metabolic panel - check today.       Constipation    Can use miralax.  Follow.        Essential hypertension, benign    Continue losartan, amlodipine, coreg and aldactone.  Blood pressure doing well.  Follow pressures.  Follow metabolic panel.       Hypercholesterolemia - Primary    Follow lipid panel. Check today.       Relevant Orders   Hepatic function panel (Completed)   Lipid panel (Completed)   Basic metabolic panel (Completed)   Hypothyroidism    On thyroid replacement.  Follow tsh.       Other thrombophilia ([place])    History of afib. On eliquis.        Pulmonary hypertension ([place])    Followed by cardiology.       Renal cell carcinoma ([place])    Has a known renal mass.  Discussed again today.  Declines further w/up and evaluation.       Sick sinus syndrome ([place])    Pacemaker in place.  Followed by cardiology.         Einar Pheasant, MD

## 2021-04-17 ENCOUNTER — Encounter: Payer: Self-pay | Admitting: Internal Medicine

## 2021-04-17 ENCOUNTER — Telehealth: Payer: Self-pay

## 2021-04-17 DIAGNOSIS — K5900 Constipation, unspecified: Secondary | ICD-10-CM | POA: Insufficient documentation

## 2021-04-17 DIAGNOSIS — N183 Chronic kidney disease, stage 3 unspecified: Secondary | ICD-10-CM

## 2021-04-17 DIAGNOSIS — D6869 Other thrombophilia: Secondary | ICD-10-CM | POA: Insufficient documentation

## 2021-04-17 NOTE — Assessment & Plan Note (Signed)
Upper symptoms controlled on protonix.  

## 2021-04-17 NOTE — Assessment & Plan Note (Signed)
Follow lipid panel. Check today.

## 2021-04-17 NOTE — Assessment & Plan Note (Signed)
Continue to avoid antiinflammatories.  Stay hydrated.  Follow metabolic panel - check today.

## 2021-04-17 NOTE — Assessment & Plan Note (Signed)
Pacemaker in place.  Followed by cardiology.

## 2021-04-17 NOTE — Assessment & Plan Note (Signed)
Continue eliquis.  Taking coreg. No increased heart rate or palpitations.  Pacemaker in place.   

## 2021-04-17 NOTE — Assessment & Plan Note (Signed)
Continue losartan, amlodipine, coreg and aldactone.  Blood pressure doing well.  Follow pressures.  Follow metabolic panel.

## 2021-04-17 NOTE — Telephone Encounter (Signed)
Orders placed for basic metabolic panel for future labs.

## 2021-04-17 NOTE — Assessment & Plan Note (Signed)
Can use miralax.  Follow.

## 2021-04-17 NOTE — Assessment & Plan Note (Signed)
Has a known renal mass.  Discussed again today.  Declines further w/up and evaluation.

## 2021-04-17 NOTE — Assessment & Plan Note (Signed)
History of afib.  On eliquis.   

## 2021-04-17 NOTE — Assessment & Plan Note (Signed)
Persistent increased pain.  Has been evaluated by ortho.  Medical management.  On tramadol.  Needs to help her function and get around.  Follow.

## 2021-04-17 NOTE — Assessment & Plan Note (Signed)
Followed by cardiology 

## 2021-04-17 NOTE — Assessment & Plan Note (Signed)
On thyroid replacement.  Follow tsh.  

## 2021-05-01 ENCOUNTER — Encounter: Payer: Self-pay | Admitting: Internal Medicine

## 2021-05-03 ENCOUNTER — Other Ambulatory Visit: Payer: Self-pay

## 2021-05-03 NOTE — Telephone Encounter (Signed)
Called patients son to clarify. Advised to disregard message. They are in process of filling her prescription.

## 2021-05-19 ENCOUNTER — Other Ambulatory Visit: Payer: Self-pay | Admitting: Internal Medicine

## 2021-05-25 ENCOUNTER — Other Ambulatory Visit: Payer: Self-pay | Admitting: Internal Medicine

## 2021-05-26 ENCOUNTER — Telehealth: Payer: Self-pay | Admitting: Internal Medicine

## 2021-05-26 NOTE — Telephone Encounter (Signed)
Patient has a lab appt on 05/29/2021,there are no orders in.

## 2021-05-27 ENCOUNTER — Other Ambulatory Visit: Payer: Self-pay | Admitting: Internal Medicine

## 2021-05-27 DIAGNOSIS — R944 Abnormal results of kidney function studies: Secondary | ICD-10-CM

## 2021-05-27 NOTE — Telephone Encounter (Signed)
Order placed for f/u met b 

## 2021-05-27 NOTE — Progress Notes (Signed)
Order placed for met b 

## 2021-05-29 ENCOUNTER — Other Ambulatory Visit: Payer: Medicare HMO

## 2021-06-01 ENCOUNTER — Other Ambulatory Visit (INDEPENDENT_AMBULATORY_CARE_PROVIDER_SITE_OTHER): Payer: Medicare HMO

## 2021-06-01 ENCOUNTER — Other Ambulatory Visit: Payer: Self-pay

## 2021-06-01 DIAGNOSIS — R944 Abnormal results of kidney function studies: Secondary | ICD-10-CM

## 2021-06-01 LAB — BASIC METABOLIC PANEL
BUN: 22 mg/dL (ref 6–23)
CO2: 32 mEq/L (ref 19–32)
Calcium: 9.3 mg/dL (ref 8.4–10.5)
Chloride: 93 mEq/L — ABNORMAL LOW (ref 96–112)
Creatinine, Ser: 1.15 mg/dL (ref 0.40–1.20)
GFR: 40.44 mL/min — ABNORMAL LOW (ref >60.00)
Glucose, Bld: 112 mg/dL — ABNORMAL HIGH (ref 70–99)
Potassium: 4.4 mEq/L (ref 3.5–5.1)
Sodium: 130 mEq/L — ABNORMAL LOW (ref 135–145)

## 2021-06-02 ENCOUNTER — Other Ambulatory Visit: Payer: Self-pay

## 2021-06-02 DIAGNOSIS — E871 Hypo-osmolality and hyponatremia: Secondary | ICD-10-CM

## 2021-06-05 ENCOUNTER — Other Ambulatory Visit: Payer: Self-pay | Admitting: Internal Medicine

## 2021-06-06 ENCOUNTER — Other Ambulatory Visit: Payer: Self-pay

## 2021-06-06 ENCOUNTER — Other Ambulatory Visit (INDEPENDENT_AMBULATORY_CARE_PROVIDER_SITE_OTHER): Payer: Medicare HMO

## 2021-06-06 DIAGNOSIS — E871 Hypo-osmolality and hyponatremia: Secondary | ICD-10-CM

## 2021-06-06 LAB — BASIC METABOLIC PANEL
BUN: 20 mg/dL (ref 6–23)
CO2: 32 mEq/L (ref 19–32)
Calcium: 9.4 mg/dL (ref 8.4–10.5)
Chloride: 94 mEq/L — ABNORMAL LOW (ref 96–112)
Creatinine, Ser: 1.08 mg/dL (ref 0.40–1.20)
GFR: 43.61 mL/min — ABNORMAL LOW (ref >60.00)
Glucose, Bld: 97 mg/dL (ref 70–99)
Potassium: 4.7 mEq/L (ref 3.5–5.1)
Sodium: 132 mEq/L — ABNORMAL LOW (ref 135–145)

## 2021-06-06 NOTE — Telephone Encounter (Addendum)
RX Refill: tramadol Last Seen: 04-14-21 Last Ordered: 03-29-21 Next Appt: NA

## 2021-06-06 NOTE — Telephone Encounter (Signed)
Rx ok'd for tramadol #90 with one refill.  

## 2021-06-14 ENCOUNTER — Other Ambulatory Visit: Payer: Medicare HMO

## 2021-07-05 DIAGNOSIS — I495 Sick sinus syndrome: Secondary | ICD-10-CM | POA: Diagnosis not present

## 2021-07-25 ENCOUNTER — Other Ambulatory Visit: Payer: Self-pay | Admitting: Internal Medicine

## 2021-07-26 DIAGNOSIS — I272 Pulmonary hypertension, unspecified: Secondary | ICD-10-CM | POA: Diagnosis not present

## 2021-07-26 DIAGNOSIS — I2510 Atherosclerotic heart disease of native coronary artery without angina pectoris: Secondary | ICD-10-CM | POA: Diagnosis not present

## 2021-07-26 DIAGNOSIS — I340 Nonrheumatic mitral (valve) insufficiency: Secondary | ICD-10-CM | POA: Diagnosis not present

## 2021-07-26 DIAGNOSIS — E782 Mixed hyperlipidemia: Secondary | ICD-10-CM | POA: Diagnosis not present

## 2021-07-26 DIAGNOSIS — I10 Essential (primary) hypertension: Secondary | ICD-10-CM | POA: Diagnosis not present

## 2021-07-26 DIAGNOSIS — I495 Sick sinus syndrome: Secondary | ICD-10-CM | POA: Diagnosis not present

## 2021-07-26 DIAGNOSIS — I517 Cardiomegaly: Secondary | ICD-10-CM | POA: Diagnosis not present

## 2021-07-26 DIAGNOSIS — I482 Chronic atrial fibrillation, unspecified: Secondary | ICD-10-CM | POA: Diagnosis not present

## 2021-08-07 ENCOUNTER — Other Ambulatory Visit: Payer: Self-pay | Admitting: Internal Medicine

## 2021-08-08 ENCOUNTER — Other Ambulatory Visit: Payer: Self-pay | Admitting: Internal Medicine

## 2021-08-08 NOTE — Telephone Encounter (Signed)
Refilled: 06/06/2021 Last OV: 04/14/2021 Next OV: 08/24/2021

## 2021-08-09 NOTE — Telephone Encounter (Signed)
Rx ok'd for tramadol

## 2021-08-24 ENCOUNTER — Ambulatory Visit (INDEPENDENT_AMBULATORY_CARE_PROVIDER_SITE_OTHER): Payer: Medicare HMO | Admitting: Internal Medicine

## 2021-08-24 ENCOUNTER — Other Ambulatory Visit: Payer: Self-pay | Admitting: Internal Medicine

## 2021-08-24 ENCOUNTER — Other Ambulatory Visit: Payer: Self-pay

## 2021-08-24 VITALS — BP 134/76 | HR 89 | Temp 97.6°F | Resp 16 | Ht 65.0 in | Wt 151.4 lb

## 2021-08-24 DIAGNOSIS — I4891 Unspecified atrial fibrillation: Secondary | ICD-10-CM

## 2021-08-24 DIAGNOSIS — I2510 Atherosclerotic heart disease of native coronary artery without angina pectoris: Secondary | ICD-10-CM | POA: Diagnosis not present

## 2021-08-24 DIAGNOSIS — E7800 Pure hypercholesterolemia, unspecified: Secondary | ICD-10-CM

## 2021-08-24 DIAGNOSIS — M545 Low back pain, unspecified: Secondary | ICD-10-CM

## 2021-08-24 DIAGNOSIS — C641 Malignant neoplasm of right kidney, except renal pelvis: Secondary | ICD-10-CM | POA: Diagnosis not present

## 2021-08-24 DIAGNOSIS — I495 Sick sinus syndrome: Secondary | ICD-10-CM

## 2021-08-24 DIAGNOSIS — E038 Other specified hypothyroidism: Secondary | ICD-10-CM | POA: Diagnosis not present

## 2021-08-24 DIAGNOSIS — K219 Gastro-esophageal reflux disease without esophagitis: Secondary | ICD-10-CM | POA: Diagnosis not present

## 2021-08-24 DIAGNOSIS — N1831 Chronic kidney disease, stage 3a: Secondary | ICD-10-CM

## 2021-08-24 DIAGNOSIS — I272 Pulmonary hypertension, unspecified: Secondary | ICD-10-CM

## 2021-08-24 DIAGNOSIS — I10 Essential (primary) hypertension: Secondary | ICD-10-CM

## 2021-08-24 DIAGNOSIS — D6869 Other thrombophilia: Secondary | ICD-10-CM

## 2021-08-24 DIAGNOSIS — G8929 Other chronic pain: Secondary | ICD-10-CM

## 2021-08-24 LAB — LIPID PANEL
Cholesterol: 208 mg/dL — ABNORMAL HIGH (ref 0–200)
HDL: 83 mg/dL (ref >39.00)
LDL Cholesterol: 109 mg/dL — ABNORMAL HIGH (ref 0–99)
NonHDL: 125.34
Total CHOL/HDL Ratio: 3
Triglycerides: 84 mg/dL (ref 0.0–149.0)
VLDL: 16.8 mg/dL (ref 0.0–40.0)

## 2021-08-24 LAB — CBC WITH DIFFERENTIAL/PLATELET
Basophils Absolute: 0.1 10*3/uL (ref 0.0–0.1)
Basophils Relative: 1.1 % (ref 0.0–3.0)
Eosinophils Absolute: 0.1 10*3/uL (ref 0.0–0.7)
Eosinophils Relative: 2.2 % (ref 0.0–5.0)
HCT: 40.8 % (ref 36.0–46.0)
Hemoglobin: 13.7 g/dL (ref 12.0–15.0)
Lymphocytes Relative: 15.3 % (ref 12.0–46.0)
Lymphs Abs: 1 10*3/uL (ref 0.7–4.0)
MCHC: 33.5 g/dL (ref 30.0–36.0)
MCV: 87.3 fl (ref 78.0–100.0)
Monocytes Absolute: 0.5 10*3/uL (ref 0.1–1.0)
Monocytes Relative: 8 % (ref 3.0–12.0)
Neutro Abs: 4.9 10*3/uL (ref 1.4–7.7)
Neutrophils Relative %: 73.4 % (ref 43.0–77.0)
Platelets: 219 10*3/uL (ref 150.0–400.0)
RBC: 4.67 Mil/uL (ref 3.87–5.11)
RDW: 13.2 % (ref 11.5–15.5)
WBC: 6.7 10*3/uL (ref 4.0–10.5)

## 2021-08-24 LAB — BASIC METABOLIC PANEL
BUN: 19 mg/dL (ref 6–23)
CO2: 30 mEq/L (ref 19–32)
Calcium: 9.8 mg/dL (ref 8.4–10.5)
Chloride: 97 mEq/L (ref 96–112)
Creatinine, Ser: 1.07 mg/dL (ref 0.40–1.20)
GFR: 44.03 mL/min — ABNORMAL LOW (ref >60.00)
Glucose, Bld: 101 mg/dL — ABNORMAL HIGH (ref 70–99)
Potassium: 4.7 mEq/L (ref 3.5–5.1)
Sodium: 136 mEq/L (ref 135–145)

## 2021-08-24 LAB — HEPATIC FUNCTION PANEL
ALT: 11 U/L (ref 0–35)
AST: 18 U/L (ref 0–37)
Albumin: 4.6 g/dL (ref 3.5–5.2)
Alkaline Phosphatase: 65 U/L (ref 39–117)
Bilirubin, Direct: 0.2 mg/dL (ref 0.0–0.3)
Total Bilirubin: 1 mg/dL (ref 0.2–1.2)
Total Protein: 7.3 g/dL (ref 6.0–8.3)

## 2021-08-24 LAB — TSH: TSH: 3.3 u[IU]/mL (ref 0.35–5.50)

## 2021-08-24 NOTE — Progress Notes (Signed)
Patient ID: SHAELY GADBERRY, female   DOB: August 11, 1923, 88 y.o.   MRN: [id number]   Subjective:    Patient ID: SANTITA HUNSBERGER, female    DOB: 07-19-1924, 88 y.o.   MRN: [id number]  This visit occurred during the SARS-CoV-2 public health emergency.  Safety protocols were in place, including screening questions prior to the visit, additional usage of staff PPE, and extensive cleaning of exam room while observing appropriate contact time as indicated for disinfecting solutions.   Patient here for a scheduled follow up.   Chief Complaint  Patient presents with   Hypothyroidism   Hypertension   Gastroesophageal Reflux   Chronic Kidney Disease   .   HPI Increased pain - msk/joint pain - chronic.  Has tramadol.  Has to take to function.  Takes 2/day.  Some days better than others.  Discussed further evaluation and pain management.  She declines.  No chest pain.  Breathing stable.  No cough or congestion.  No acid reflux reported.  No abdominal pain.  Bowels moving.  Being followed by cardiology - recommended more frequent PM interrogation.  PM getting towards end of life.     Past Medical History:  Diagnosis Date   Acid reflux    Allergy    Arthritis    Atrial fibrillation (HCC)    Heart disease    Heart murmur    Hx of actinic keratosis May 2015   under left breast   Hypercholesterolemia    Hypertension    Hypothyroidism    Past Surgical History:  Procedure Laterality Date   ABDOMINAL HYSTERECTOMY     CHOLECYSTECTOMY     CORNEAL TRANSPLANT     Right x 3 Left x1   CYST REMOVAL TRUNK     Tail bone   KNEE SURGERY Left    PACEMAKER INSERTION  05/03/13   Family History  Problem Relation Age of Onset   Diabetes Mother    Heart disease Father    Hyperlipidemia Father    Heart disease Sister    Arthritis Son    Social History   Socioeconomic History   Marital status: Widowed    Spouse name: Not on file   Number of children: Not on file   Years of education: Not on file   Highest  education level: Not on file  Occupational History   Not on file  Tobacco Use   Smoking status: Never   Smokeless tobacco: Never  Substance and Sexual Activity   Alcohol use: No    Alcohol/week: 0.0 standard drinks   Drug use: No   Sexual activity: Not on file  Other Topics Concern   Not on file  Social History Narrative   Not on file   Social Determinants of Health   Financial Resource Strain: Not on file  Food Insecurity: Not on file  Transportation Needs: Not on file  Physical Activity: Not on file  Stress: Not on file  Social Connections: Not on file     Review of Systems  Constitutional:  Negative for appetite change and unexpected weight change.  HENT:  Negative for congestion and sinus pressure.   Respiratory:  Negative for cough, chest tightness and shortness of breath.   Cardiovascular:  Negative for chest pain, palpitations and leg swelling.  Gastrointestinal:  Negative for abdominal pain, diarrhea, nausea and vomiting.  Genitourinary:  Negative for difficulty urinating and dysuria.  Musculoskeletal:  Negative for myalgias.       Chronic pain - back  pain as outlined.   Skin:  Negative for color change and rash.  Neurological:  Negative for dizziness, light-headedness and headaches.  Psychiatric/Behavioral:  Negative for agitation and dysphoric mood.       Objective:     BP 134/76    Pulse 89    Temp 97.6 F (36.4 C)    Resp 16    Ht 5\' 5"  (1.651 m)    Wt 151 lb 6.4 oz (68.7 kg)    SpO2 97%    BMI 25.19 kg/m  Wt Readings from Last 3 Encounters:  08/24/21 151 lb 6.4 oz (68.7 kg)  04/14/21 152 lb 12.8 oz (69.3 kg)  10/17/20 150 lb (68 kg)    Physical Exam Vitals reviewed.  Constitutional:      General: She is not in acute distress.    Appearance: Normal appearance.  HENT:     Head: Normocephalic and atraumatic.     Right Ear: External ear normal.     Left Ear: External ear normal.  Eyes:     General: No scleral icterus.       Right eye: No  discharge.        Left eye: No discharge.     Conjunctiva/sclera: Conjunctivae normal.  Neck:     Thyroid: No thyromegaly.  Cardiovascular:     Rate and Rhythm: Normal rate and regular rhythm.  Pulmonary:     Effort: No respiratory distress.     Breath sounds: Normal breath sounds. No wheezing.  Abdominal:     General: Bowel sounds are normal.     Palpations: Abdomen is soft.     Tenderness: There is no abdominal tenderness.  Musculoskeletal:        General: No swelling or tenderness.     Cervical back: Neck supple. No tenderness.  Lymphadenopathy:     Cervical: No cervical adenopathy.  Skin:    Findings: No erythema or rash.  Neurological:     Mental Status: She is alert.  Psychiatric:        Mood and Affect: Mood normal.        Behavior: Behavior normal.     Outpatient Encounter Medications as of 08/24/2021  Medication Sig   apixaban (ELIQUIS) 5 MG TABS tablet Take 5 mg by mouth 2 (two) times daily.    carvedilol (COREG) 3.125 MG tablet Take 1 tablet by mouth in the morning and at bedtime.   levothyroxine (SYNTHROID) 50 MCG tablet TAKE 1 TABLET BY MOUTH DAILY 30 TO 60 MINUTES BEFORE BREAKFAST ON AN EMPTY STOMACH AND WITH GLASS OF WATER   losartan (COZAAR) 100 MG tablet TAKE 1 TABLET BY MOUTH EVERY DAY   pantoprazole (PROTONIX) 40 MG tablet TAKE 1 TABLET(40 MG) BY MOUTH TWICE DAILY   spironolactone (ALDACTONE) 25 MG tablet TAKE 1/2 TABLET(12.5 MG) BY MOUTH DAILY   traMADol (ULTRAM) 50 MG tablet TAKE 1 TABLET BY MOUTH 2 TO 3 TIMES DAILY AS NEEDED   [DISCONTINUED] amLODipine (NORVASC) 5 MG tablet TAKE 1 TABLET(5 MG) BY MOUTH DAILY   No facility-administered encounter medications on file as of 08/24/2021.     Lab Results  Component Value Date   WBC 6.7 08/24/2021   HGB 13.7 08/24/2021   HCT 40.8 08/24/2021   PLT 219.0 08/24/2021   GLUCOSE 101 (H) 08/24/2021   CHOL 208 (H) 08/24/2021   TRIG 84.0 08/24/2021   HDL 83.00 08/24/2021   LDLCALC 109 (H) 08/24/2021   ALT 11  08/24/2021   AST 18 08/24/2021  NA 136 08/24/2021   K 4.7 08/24/2021   CL 97 08/24/2021   CREATININE 1.07 08/24/2021   BUN 19 08/24/2021   CO2 30 08/24/2021   TSH 3.30 08/24/2021   INR 1.04 04/19/2015   HGBA1C 5.4 09/25/2018       Assessment & Plan:   Problem List Items Addressed This Visit     Acid reflux    Upper symptoms controlled on protonix.       Arteriosclerosis of coronary artery    Followed by cardiology.  Stable.       Atrial fibrillation ([place])    Continue eliquis.  Taking coreg. No increased heart rate or palpitations.  Pacemaker in place.        Back pain    Persistent increased pain.  Has been evaluated by ortho.  Medical management.  On tramadol.  Needs to help her function and get around.  Follow. Declines further evaluation.       Chronic kidney disease, stage 3 unspecified ([place])    Continue to avoid antiinflammatories.  Stay hydrated.  Follow metabolic panel - check today. Continue losartan.       Essential hypertension, benign    Continue losartan, amlodipine, coreg and aldactone.  Blood pressure doing well.  Follow pressures.  Follow metabolic panel.       Relevant Orders   CBC with Differential/Platelet (Completed)   Basic metabolic panel (Completed)   Hypercholesterolemia - Primary    Follow lipid panel. Check today.       Relevant Orders   Lipid panel (Completed)   Hepatic function panel (Completed)   Hypothyroidism    On thyroid replacement.  Follow tsh.       Relevant Orders   TSH (Completed)   Other thrombophilia ([place])    History of afib. On eliquis.        Pulmonary hypertension ([place])    Followed by cardiology.       Renal cell carcinoma ([place])    Has a known renal mass.  Discussed again today.  Declines further w/up and evaluation.       Sick sinus syndrome ([place])    Pacemaker in place.  Followed by cardiology.  More frequent checks as outlined.          Einar Pheasant, MD

## 2021-08-27 ENCOUNTER — Encounter: Payer: Self-pay | Admitting: Internal Medicine

## 2021-08-27 NOTE — Assessment & Plan Note (Signed)
Followed by cardiology. Stable.   

## 2021-08-27 NOTE — Assessment & Plan Note (Signed)
Follow lipid panel. Check today.

## 2021-08-27 NOTE — Assessment & Plan Note (Signed)
Has a known renal mass.  Discussed again today.  Declines further w/up and evaluation.

## 2021-08-27 NOTE — Assessment & Plan Note (Signed)
On thyroid replacement.  Follow tsh.  

## 2021-08-27 NOTE — Assessment & Plan Note (Signed)
Followed by cardiology 

## 2021-08-27 NOTE — Assessment & Plan Note (Signed)
Continue eliquis.  Taking coreg. No increased heart rate or palpitations.  Pacemaker in place.   

## 2021-08-27 NOTE — Assessment & Plan Note (Signed)
Continue to avoid antiinflammatories.  Stay hydrated.  Follow metabolic panel - check today. Continue losartan.  

## 2021-08-27 NOTE — Assessment & Plan Note (Signed)
Pacemaker in place.  Followed by cardiology.  More frequent checks as outlined.

## 2021-08-27 NOTE — Assessment & Plan Note (Signed)
Continue losartan, amlodipine, coreg and aldactone.  Blood pressure doing well.  Follow pressures.  Follow metabolic panel.

## 2021-08-27 NOTE — Assessment & Plan Note (Signed)
Persistent increased pain.  Has been evaluated by ortho.  Medical management.  On tramadol.  Needs to help her function and get around.  Follow. Declines further evaluation.

## 2021-08-27 NOTE — Assessment & Plan Note (Signed)
Upper symptoms controlled on protonix.  

## 2021-08-27 NOTE — Assessment & Plan Note (Signed)
History of afib.  On eliquis.   

## 2021-10-16 ENCOUNTER — Other Ambulatory Visit: Payer: Self-pay | Admitting: Internal Medicine

## 2021-10-17 NOTE — Telephone Encounter (Signed)
I have refilled her tramadol. I have written for three per day, but would like for her to try to limit to two per day if possible and use tylenol with tramadol.   ?

## 2021-10-31 DIAGNOSIS — I495 Sick sinus syndrome: Secondary | ICD-10-CM | POA: Diagnosis not present

## 2021-11-28 ENCOUNTER — Other Ambulatory Visit: Payer: Self-pay | Admitting: Internal Medicine

## 2021-11-28 ENCOUNTER — Encounter: Payer: Self-pay | Admitting: Internal Medicine

## 2021-11-29 ENCOUNTER — Other Ambulatory Visit: Payer: Self-pay

## 2021-11-29 MED ORDER — LOSARTAN POTASSIUM 50 MG PO TABS: 50.0000 mg | ORAL_TABLET | Freq: Every day | ORAL | 3 refills | Status: AC

## 2021-12-18 ENCOUNTER — Other Ambulatory Visit: Payer: Self-pay | Admitting: Internal Medicine

## 2021-12-19 NOTE — Telephone Encounter (Signed)
Rx ok'd for tramadol #90 with one refill.  

## 2022-01-30 DIAGNOSIS — I495 Sick sinus syndrome: Secondary | ICD-10-CM | POA: Diagnosis not present

## 2022-02-02 DIAGNOSIS — I495 Sick sinus syndrome: Secondary | ICD-10-CM | POA: Diagnosis not present

## 2022-02-02 DIAGNOSIS — I10 Essential (primary) hypertension: Secondary | ICD-10-CM | POA: Diagnosis not present

## 2022-02-02 DIAGNOSIS — I482 Chronic atrial fibrillation, unspecified: Secondary | ICD-10-CM | POA: Diagnosis not present

## 2022-02-02 DIAGNOSIS — E782 Mixed hyperlipidemia: Secondary | ICD-10-CM | POA: Diagnosis not present

## 2022-02-02 DIAGNOSIS — I272 Pulmonary hypertension, unspecified: Secondary | ICD-10-CM | POA: Diagnosis not present

## 2022-02-02 DIAGNOSIS — I340 Nonrheumatic mitral (valve) insufficiency: Secondary | ICD-10-CM | POA: Diagnosis not present

## 2022-02-02 DIAGNOSIS — I2510 Atherosclerotic heart disease of native coronary artery without angina pectoris: Secondary | ICD-10-CM | POA: Diagnosis not present

## 2022-02-02 DIAGNOSIS — I517 Cardiomegaly: Secondary | ICD-10-CM | POA: Diagnosis not present

## 2022-02-02 DIAGNOSIS — Z950 Presence of cardiac pacemaker: Secondary | ICD-10-CM | POA: Diagnosis not present

## 2022-02-20 ENCOUNTER — Other Ambulatory Visit: Payer: Self-pay | Admitting: Internal Medicine

## 2022-02-21 NOTE — Telephone Encounter (Signed)
Rx ok'd for tramadol. PDMP reviewed.

## 2022-04-21 ENCOUNTER — Other Ambulatory Visit: Payer: Self-pay | Admitting: Internal Medicine

## 2022-04-23 NOTE — Telephone Encounter (Signed)
Rx ok'd for tramadol #90 with one refill.

## 2022-04-30 ENCOUNTER — Other Ambulatory Visit: Payer: Self-pay | Admitting: Internal Medicine

## 2022-05-01 DIAGNOSIS — I495 Sick sinus syndrome: Secondary | ICD-10-CM | POA: Diagnosis not present

## 2022-05-02 ENCOUNTER — Ambulatory Visit: Payer: Medicare HMO | Admitting: Internal Medicine

## 2022-05-02 NOTE — Progress Notes (Deleted)
Patient ID: Heidi Baldwin, female   DOB: May 15, 1924, 88 y.o.   MRN: [id number]   Subjective:    Patient ID: Heidi Baldwin, female    DOB: 06-22-1924, 87 y.o.   MRN: [id number]   Patient here for No chief complaint on file.  Marland Kitchen   HPI Saw cardiology 02/02/22. Pacemaker in place due to sick sinus syndrome and afib.  Remains on carvedilol and eliquis. On tramadol for her back.     Past Medical History:  Diagnosis Date   Acid reflux    Allergy    Arthritis    Atrial fibrillation (HCC)    Heart disease    Heart murmur    Hx of actinic keratosis May 2015   under left breast   Hypercholesterolemia    Hypertension    Hypothyroidism    Past Surgical History:  Procedure Laterality Date   ABDOMINAL HYSTERECTOMY     CHOLECYSTECTOMY     CORNEAL TRANSPLANT     Right x 3 Left x1   CYST REMOVAL TRUNK     Tail bone   KNEE SURGERY Left    PACEMAKER INSERTION  05/03/13   Family History  Problem Relation Age of Onset   Diabetes Mother    Heart disease Father    Hyperlipidemia Father    Heart disease Sister    Arthritis Son    Social History   Socioeconomic History   Marital status: Widowed    Spouse name: Not on file   Number of children: Not on file   Years of education: Not on file   Highest education level: Not on file  Occupational History   Not on file  Tobacco Use   Smoking status: Never   Smokeless tobacco: Never  Substance and Sexual Activity   Alcohol use: No    Alcohol/week: 0.0 standard drinks of alcohol   Drug use: No   Sexual activity: Not on file  Other Topics Concern   Not on file  Social History Narrative   Not on file   Social Determinants of Health   Financial Resource Strain: Not on file  Food Insecurity: Not on file  Transportation Needs: Not on file  Physical Activity: Not on file  Stress: Not on file  Social Connections: Not on file     Review of Systems     Objective:     There were no vitals taken for this visit. Wt Readings from Last  3 Encounters:  08/24/21 151 lb 6.4 oz (68.7 kg)  04/14/21 152 lb 12.8 oz (69.3 kg)  10/17/20 150 lb (68 kg)    Physical Exam   Outpatient Encounter Medications as of 05/02/2022  Medication Sig   amLODipine (NORVASC) 5 MG tablet TAKE 1 TABLET(5 MG) BY MOUTH DAILY   apixaban (ELIQUIS) 5 MG TABS tablet Take 5 mg by mouth 2 (two) times daily.    carvedilol (COREG) 3.125 MG tablet Take 1 tablet by mouth in the morning and at bedtime.   levothyroxine (SYNTHROID) 50 MCG tablet TAKE 1 TABLET BY MOUTH DAILY 30 TO 60 MINUTES BEFORE BREAKFAST ON AN EMPTY STOMACH AND WITH GLASS OF WATER   losartan (COZAAR) 100 MG tablet TAKE 1 TABLET BY MOUTH EVERY DAY   losartan (COZAAR) 50 MG tablet Take 1 tablet (50 mg total) by mouth daily.   pantoprazole (PROTONIX) 40 MG tablet TAKE 1 TABLET(40 MG) BY MOUTH TWICE DAILY   spironolactone (ALDACTONE) 25 MG tablet TAKE 1/2 TABLET(12.5 MG) BY MOUTH DAILY  traMADol (ULTRAM) 50 MG tablet TAKE 1 TABLET BY MOUTH 2 TO 3 TIMES DAILY AS NEEDED   No facility-administered encounter medications on file as of 05/02/2022.     Lab Results  Component Value Date   WBC 6.7 08/24/2021   HGB 13.7 08/24/2021   HCT 40.8 08/24/2021   PLT 219.0 08/24/2021   GLUCOSE 101 (H) 08/24/2021   CHOL 208 (H) 08/24/2021   TRIG 84.0 08/24/2021   HDL 83.00 08/24/2021   LDLCALC 109 (H) 08/24/2021   ALT 11 08/24/2021   AST 18 08/24/2021   NA 136 08/24/2021   K 4.7 08/24/2021   CL 97 08/24/2021   CREATININE 1.07 08/24/2021   BUN 19 08/24/2021   CO2 30 08/24/2021   TSH 3.30 08/24/2021   INR 1.04 04/19/2015   HGBA1C 5.4 09/25/2018       Assessment & Plan:   Problem List Items Addressed This Visit   None    Einar Pheasant, MD

## 2022-05-03 ENCOUNTER — Inpatient Hospital Stay: Payer: Medicare HMO

## 2022-05-03 ENCOUNTER — Other Ambulatory Visit: Payer: Self-pay

## 2022-05-03 ENCOUNTER — Inpatient Hospital Stay
Admission: EM | Admit: 2022-05-03 | Discharge: 2022-05-05 | DRG: 291 | Disposition: A | Discharge status: Home / Self Care | Payer: Medicare HMO | Attending: Obstetrics and Gynecology | Admitting: Obstetrics and Gynecology

## 2022-05-03 ENCOUNTER — Emergency Department: Payer: Medicare HMO

## 2022-05-03 ENCOUNTER — Ambulatory Visit
Admission: RE | Admit: 2022-05-03 | Discharge: 2022-05-03 | Disposition: A | Discharge status: Home / Self Care | Payer: Medicare HMO | Source: Ambulatory Visit | Attending: Family Medicine | Admitting: Family Medicine

## 2022-05-03 ENCOUNTER — Ambulatory Visit (INDEPENDENT_AMBULATORY_CARE_PROVIDER_SITE_OTHER): Payer: Medicare HMO

## 2022-05-03 VITALS — BP 126/67 | HR 69 | Temp 97.7°F | Resp 20 | Ht 64.0 in | Wt 145.0 lb

## 2022-05-03 DIAGNOSIS — Z79890 Hormone replacement therapy (postmenopausal): Secondary | ICD-10-CM

## 2022-05-03 DIAGNOSIS — E039 Hypothyroidism, unspecified: Secondary | ICD-10-CM | POA: Diagnosis present

## 2022-05-03 DIAGNOSIS — I10 Essential (primary) hypertension: Secondary | ICD-10-CM | POA: Diagnosis present

## 2022-05-03 DIAGNOSIS — R0602 Shortness of breath: Secondary | ICD-10-CM | POA: Diagnosis not present

## 2022-05-03 DIAGNOSIS — N1832 Chronic kidney disease, stage 3b: Secondary | ICD-10-CM | POA: Diagnosis present

## 2022-05-03 DIAGNOSIS — Z83438 Family history of other disorder of lipoprotein metabolism and other lipidemia: Secondary | ICD-10-CM | POA: Diagnosis not present

## 2022-05-03 DIAGNOSIS — K219 Gastro-esophageal reflux disease without esophagitis: Secondary | ICD-10-CM | POA: Diagnosis present

## 2022-05-03 DIAGNOSIS — E871 Hypo-osmolality and hyponatremia: Secondary | ICD-10-CM | POA: Diagnosis not present

## 2022-05-03 DIAGNOSIS — Z950 Presence of cardiac pacemaker: Secondary | ICD-10-CM | POA: Diagnosis not present

## 2022-05-03 DIAGNOSIS — J9601 Acute respiratory failure with hypoxia: Secondary | ICD-10-CM | POA: Diagnosis present

## 2022-05-03 DIAGNOSIS — E7800 Pure hypercholesterolemia, unspecified: Secondary | ICD-10-CM | POA: Diagnosis present

## 2022-05-03 DIAGNOSIS — Z8249 Family history of ischemic heart disease and other diseases of the circulatory system: Secondary | ICD-10-CM

## 2022-05-03 DIAGNOSIS — J90 Pleural effusion, not elsewhere classified: Secondary | ICD-10-CM

## 2022-05-03 DIAGNOSIS — I4891 Unspecified atrial fibrillation: Secondary | ICD-10-CM | POA: Diagnosis not present

## 2022-05-03 DIAGNOSIS — I272 Pulmonary hypertension, unspecified: Secondary | ICD-10-CM | POA: Diagnosis present

## 2022-05-03 DIAGNOSIS — R0600 Dyspnea, unspecified: Secondary | ICD-10-CM | POA: Diagnosis not present

## 2022-05-03 DIAGNOSIS — R0902 Hypoxemia: Secondary | ICD-10-CM | POA: Diagnosis not present

## 2022-05-03 DIAGNOSIS — Z888 Allergy status to other drugs, medicaments and biological substances status: Secondary | ICD-10-CM | POA: Diagnosis not present

## 2022-05-03 DIAGNOSIS — Z79899 Other long term (current) drug therapy: Secondary | ICD-10-CM | POA: Diagnosis not present

## 2022-05-03 DIAGNOSIS — Z1152 Encounter for screening for COVID-19: Secondary | ICD-10-CM | POA: Diagnosis not present

## 2022-05-03 DIAGNOSIS — I081 Rheumatic disorders of both mitral and tricuspid valves: Secondary | ICD-10-CM | POA: Diagnosis present

## 2022-05-03 DIAGNOSIS — Z66 Do not resuscitate: Secondary | ICD-10-CM | POA: Diagnosis not present

## 2022-05-03 DIAGNOSIS — Z947 Corneal transplant status: Secondary | ICD-10-CM

## 2022-05-03 DIAGNOSIS — I110 Hypertensive heart disease with heart failure: Secondary | ICD-10-CM | POA: Diagnosis not present

## 2022-05-03 DIAGNOSIS — G8929 Other chronic pain: Secondary | ICD-10-CM | POA: Diagnosis present

## 2022-05-03 DIAGNOSIS — Z882 Allergy status to sulfonamides status: Secondary | ICD-10-CM

## 2022-05-03 DIAGNOSIS — I130 Hypertensive heart and chronic kidney disease with heart failure and stage 1 through stage 4 chronic kidney disease, or unspecified chronic kidney disease: Principal | ICD-10-CM | POA: Diagnosis present

## 2022-05-03 DIAGNOSIS — I509 Heart failure, unspecified: Secondary | ICD-10-CM

## 2022-05-03 DIAGNOSIS — I5041 Acute combined systolic (congestive) and diastolic (congestive) heart failure: Secondary | ICD-10-CM | POA: Diagnosis not present

## 2022-05-03 DIAGNOSIS — Z7901 Long term (current) use of anticoagulants: Secondary | ICD-10-CM | POA: Diagnosis not present

## 2022-05-03 DIAGNOSIS — I495 Sick sinus syndrome: Secondary | ICD-10-CM | POA: Diagnosis present

## 2022-05-03 DIAGNOSIS — I700 Atherosclerosis of aorta: Secondary | ICD-10-CM | POA: Diagnosis not present

## 2022-05-03 LAB — COMPREHENSIVE METABOLIC PANEL
ALT: 15 U/L (ref 0–44)
AST: 24 U/L (ref 15–41)
Albumin: 4.5 g/dL (ref 3.5–5.0)
Alkaline Phosphatase: 62 U/L (ref 38–126)
Anion gap: 8 (ref 5–15)
BUN: 18 mg/dL (ref 8–23)
CO2: 28 mmol/L (ref 22–32)
Calcium: 9.5 mg/dL (ref 8.9–10.3)
Chloride: 92 mmol/L — ABNORMAL LOW (ref 98–111)
Creatinine, Ser: 0.86 mg/dL (ref 0.44–1.00)
GFR, Estimated: >60 mL/min (ref >60)
Glucose, Bld: 114 mg/dL — ABNORMAL HIGH (ref 70–99)
Potassium: 4.5 mmol/L (ref 3.5–5.1)
Sodium: 128 mmol/L — ABNORMAL LOW (ref 135–145)
Total Bilirubin: 1.6 mg/dL — ABNORMAL HIGH (ref 0.3–1.2)
Total Protein: 7.9 g/dL (ref 6.5–8.1)

## 2022-05-03 LAB — LACTIC ACID, PLASMA
Lactic Acid, Venous: 1.3 mmol/L (ref 0.5–1.9)
Lactic Acid, Venous: 1.4 mmol/L (ref 0.5–1.9)

## 2022-05-03 LAB — TROPONIN I (HIGH SENSITIVITY)
Troponin I (High Sensitivity): 19 ng/L — ABNORMAL HIGH (ref <18)
Troponin I (High Sensitivity): 23 ng/L — ABNORMAL HIGH (ref <18)

## 2022-05-03 LAB — CBC WITH DIFFERENTIAL/PLATELET
Abs Immature Granulocytes: 0.03 10*3/uL (ref 0.00–0.07)
Basophils Absolute: 0.1 10*3/uL (ref 0.0–0.1)
Basophils Relative: 1 %
Eosinophils Absolute: 0.1 10*3/uL (ref 0.0–0.5)
Eosinophils Relative: 1 %
HCT: 40.2 % (ref 36.0–46.0)
Hemoglobin: 13.6 g/dL (ref 12.0–15.0)
Immature Granulocytes: 0 %
Lymphocytes Relative: 9 %
Lymphs Abs: 0.8 10*3/uL (ref 0.7–4.0)
MCH: 28.9 pg (ref 26.0–34.0)
MCHC: 33.8 g/dL (ref 30.0–36.0)
MCV: 85.5 fL (ref 80.0–100.0)
Monocytes Absolute: 0.7 10*3/uL (ref 0.1–1.0)
Monocytes Relative: 8 %
Neutro Abs: 7.1 10*3/uL (ref 1.7–7.7)
Neutrophils Relative %: 81 %
Platelets: 226 10*3/uL (ref 150–400)
RBC: 4.7 MIL/uL (ref 3.87–5.11)
RDW: 12.5 % (ref 11.5–15.5)
WBC: 8.7 10*3/uL (ref 4.0–10.5)
nRBC: 0 % (ref 0.0–0.2)

## 2022-05-03 LAB — SODIUM, URINE, RANDOM: Sodium, Ur: 92 mmol/L

## 2022-05-03 LAB — PROCALCITONIN: Procalcitonin: <0.1 ng/mL

## 2022-05-03 LAB — SARS CORONAVIRUS 2 BY RT PCR: SARS Coronavirus 2 by RT PCR: NEGATIVE

## 2022-05-03 LAB — OSMOLALITY, URINE: Osmolality, Ur: 273 mOsm/kg — ABNORMAL LOW (ref 300–900)

## 2022-05-03 LAB — BRAIN NATRIURETIC PEPTIDE: B Natriuretic Peptide: 512.2 pg/mL — ABNORMAL HIGH (ref 0.0–100.0)

## 2022-05-03 MED ORDER — TRAMADOL HCL 50 MG PO TABS
50.0000 mg | ORAL_TABLET | Freq: Two times a day (BID) | ORAL | Status: DC | PRN
  Administered 2022-05-04: 50 mg via ORAL
  Filled 2022-05-03: qty 1

## 2022-05-03 MED ORDER — FUROSEMIDE 10 MG/ML IJ SOLN
20.0000 mg | Freq: Every day | INTRAMUSCULAR | Status: DC
  Administered 2022-05-04: 20 mg via INTRAVENOUS
  Filled 2022-05-03: qty 4

## 2022-05-03 MED ORDER — FUROSEMIDE 10 MG/ML IJ SOLN
20.0000 mg | Freq: Once | INTRAMUSCULAR | Status: AC
  Administered 2022-05-03: 20 mg via INTRAVENOUS
  Filled 2022-05-03: qty 4

## 2022-05-03 MED ORDER — CARVEDILOL 3.125 MG PO TABS
3.1250 mg | ORAL_TABLET | Freq: Two times a day (BID) | ORAL | Status: DC
  Administered 2022-05-04 – 2022-05-05 (×2): 3.125 mg via ORAL
  Filled 2022-05-03 (×2): qty 1

## 2022-05-03 MED ORDER — SODIUM CHLORIDE 0.9% FLUSH: 3.0000 mL | INTRAVENOUS | Status: DC | PRN

## 2022-05-03 MED ORDER — SPIRONOLACTONE 12.5 MG HALF TABLET
12.5000 mg | ORAL_TABLET | Freq: Every day | ORAL | Status: DC
  Administered 2022-05-04: 12.5 mg via ORAL
  Filled 2022-05-03 (×2): qty 1

## 2022-05-03 MED ORDER — SODIUM CHLORIDE 0.9 % IV SOLN: 250.0000 mL | INTRAVENOUS | Status: DC | PRN

## 2022-05-03 MED ORDER — APIXABAN 5 MG PO TABS
5.0000 mg | ORAL_TABLET | Freq: Two times a day (BID) | ORAL | Status: DC
  Administered 2022-05-03: 5 mg via ORAL
  Filled 2022-05-03: qty 1

## 2022-05-03 MED ORDER — SODIUM CHLORIDE 0.9% FLUSH
3.0000 mL | Freq: Two times a day (BID) | INTRAVENOUS | Status: DC
  Administered 2022-05-03 – 2022-05-05 (×3): 3 mL via INTRAVENOUS

## 2022-05-03 MED ORDER — AMLODIPINE BESYLATE 5 MG PO TABS
5.0000 mg | ORAL_TABLET | Freq: Every day | ORAL | Status: DC
  Administered 2022-05-04 – 2022-05-05 (×2): 5 mg via ORAL
  Filled 2022-05-03 (×2): qty 1

## 2022-05-03 MED ORDER — LEVOTHYROXINE SODIUM 50 MCG PO TABS
50.0000 ug | ORAL_TABLET | Freq: Every day | ORAL | Status: DC
  Administered 2022-05-04 – 2022-05-05 (×2): 50 ug via ORAL
  Filled 2022-05-03 (×2): qty 1

## 2022-05-03 MED ORDER — PANTOPRAZOLE SODIUM 40 MG PO TBEC
40.0000 mg | DELAYED_RELEASE_TABLET | Freq: Every day | ORAL | Status: DC
  Administered 2022-05-04 – 2022-05-05 (×2): 40 mg via ORAL
  Filled 2022-05-03 (×2): qty 1

## 2022-05-03 NOTE — ED Provider Triage Note (Signed)
Emergency Medicine Provider Triage Evaluation Note  Heidi Baldwin , a 88 y.o. female  was evaluated in triage.  Pt complains of shortness of breath, seen at [facility] diagnosed with pleural effusion.  Review of Systems  Positive: sob Negative: fever  Physical Exam  BP (!) 150/70   Pulse 81   Temp 97.6 F (36.4 C) (Oral)   Resp 18   Wt 66.7 kg   SpO2 90%   BMI 25.23 kg/m  Gen:   Awake, no distress   Resp:  Normal effort  MSK:   Moves extremities without difficulty  Other:    Medical Decision Making  Medically screening exam initiated at 2:28 PM.  Appropriate orders placed.  Heidi Baldwin was informed that the remainder of the evaluation will be completed by another provider, this initial triage assessment does not replace that evaluation, and the importance of remaining in the ED until their evaluation is complete.  Pt placed on 2L 02   Versie Starks, PA-C 05/03/22 1431

## 2022-05-03 NOTE — Discharge Instructions (Addendum)
You have fluid in your lungs on the right side.   You have been advised to follow up immediately in the emergency department for concerning signs or symptoms as discussed during your visit. If you declined EMS transport, please have a family member take you directly to the ED at this time. Do not delay.   Based on concerns about condition, if you do not follow up in the ED, you may risk poor outcomes including worsening of condition, delayed treatment and potentially life threatening issues. If you have declined to go to the ED at this time, you should call your PCP immediately to set up a follow up appointment.   Go to ED for red flag symptoms, including; fevers you cannot reduce with Tylenol/Motrin, severe headaches, vision changes, numbness/weakness in part of the body, lethargy, confusion, intractable vomiting, severe dehydration, chest pain, breathing difficulty, severe persistent abdominal or pelvic pain, signs of severe infection (increased redness, swelling of an area), feeling faint or passing out, dizziness, etc. You should especially go to the ED for sudden acute worsening of condition if you do not elect to go at this time.

## 2022-05-03 NOTE — ED Notes (Signed)
Pt unable to give urine sample at this time 

## 2022-05-03 NOTE — ED Provider Notes (Signed)
[facility] Provider Note    Event Date/Time   First MD Initiated Contact with Patient 05/03/22 1522     (approximate)   History   Aspiration and Shortness of Breath   HPI  Heidi Baldwin is a 88 y.o. female  here with SOB. Pt reports worsening SOB x 3 days. Pt states sx actually began after she was straining very hard to go to the bathroom. She then had SOB throughout the next day, worse w/ exertion and lying flat. Had difficulty sleeping due to this. SOB persisted to today so she went to UC and was sent here after being found mildly hypoxic. Pt does not wear O2. Was 89-90% on RA. Pt has had persistent SOB throughout the day today. Denies any CP. She has noted some increased bl leg swelling. Reports she has been admitted before for something similar and tx with fluid pills though does not take these regularly. She does have a PM. No recent med changes.       Physical Exam   Triage Vital Signs: ED Triage Vitals [05/03/22 1417]  Enc Vitals Group     BP (!) 150/70     Pulse Rate 81     Resp 18     Temp 97.6 F (36.4 C)     Temp Source Oral     SpO2 90 %     Weight 147 lb (66.7 kg)     Height      Head Circumference      Peak Flow      Pain Score 0     Pain Loc      Pain Edu?      Excl. in [place]?     Most recent vital signs: Vitals:   05/03/22 1417 05/03/22 1600  BP: (!) 150/70 (!) 144/69  Pulse: 81 72  Resp: 18 17  Temp: 97.6 F (36.4 C)   SpO2: 90% 99%     General: Awake, no distress.  CV:  Good peripheral perfusion. RRR. Resp:  Normal effort. Mild tachypnea noted, bilateral rales with diminished BS on right. Abd:  No distention.  Other:  2+ edema b/l lower extremities.   ED Results / Procedures / Treatments   Labs (all labs ordered are listed, but only abnormal results are displayed) Labs Reviewed  COMPREHENSIVE METABOLIC PANEL - Abnormal; Notable for the following components:      Result Value   Sodium 128 (*)    Chloride 92  (*)    Glucose, Bld 114 (*)    Total Bilirubin 1.6 (*)    All other components within normal limits  BRAIN NATRIURETIC PEPTIDE - Abnormal; Notable for the following components:   B Natriuretic Peptide 512.2 (*)    All other components within normal limits  TROPONIN I (HIGH SENSITIVITY) - Abnormal; Notable for the following components:   Troponin I (High Sensitivity) 19 (*)    All other components within normal limits  SARS CORONAVIRUS 2 BY RT PCR  CBC WITH DIFFERENTIAL/PLATELET  LACTIC ACID, PLASMA  PROCALCITONIN  LACTIC ACID, PLASMA  TROPONIN I (HIGH SENSITIVITY)     EKG Ventricular paced rhythm, VR 72. QRS 182, QTc 529. No acute Sgarbossa criteria or evidence of ischemia.   RADIOLOGY CXR: Reviewed from UC, shows small to moderate R pleural effusion with atelectasis vs airspace disease   I also independently reviewed and agree with radiologist interpretations.   PROCEDURES:  Critical Care performed: No  .1-3 Lead EKG Interpretation  Performed by: Duffy Bruce, MD Authorized by: Duffy Bruce, MD     Interpretation: normal     ECG rate:  80-90   ECG rate assessment: normal     Rhythm: sinus rhythm     Ectopy: none     Conduction: normal   Comments:     Indication: SOB     MEDICATIONS ORDERED IN ED: Medications  furosemide (LASIX) injection 20 mg (has no administration in time range)     IMPRESSION / MDM / ASSESSMENT AND PLAN / ED COURSE  I reviewed the triage vital signs and the nursing notes.                               The patient is on the cardiac monitor to evaluate for evidence of arrhythmia and/or significant heart rate changes.   Ddx:  Differential includes the following, with pertinent life- or limb-threatening emergencies considered:  CHF, pleural effusion, PNA, atelectasis w/ effusion, PTX, anemia, PE  Patient's presentation is most consistent with acute presentation with potential threat to life or bodily function.  MDM:  88  yo F with h/o HTN, PM placement, here with cough, SOB, orthopnea. Exam is c/w CHF and CXR reviewed, shows R basilar atelectasis/infiltrate and effusion which I suspect is related to fluid overload given pitting edema on exam. Pt has no fever, leukocytosis, sputum production or other sx to suggest PNA. No unilateral swelling to suggest PE. Pt satting 89% on RA, dyspneic with slight exertion. BNP >500 and trop slightly elevated though EKG is non ischemic. No CHF noted on last TTE. Will admit for diuresis, monitoring, possible TTE. Pt updated and in agreement.   MEDICATIONS GIVEN IN ED: Medications  furosemide (LASIX) injection 20 mg (has no administration in time range)     Consults:  Hospitalist   EMR reviewed  Echo 2021 - normal EF at that time Dr. Nehemiah Massed notes from Cardiology     FINAL CLINICAL IMPRESSION(S) / ED DIAGNOSES   Final diagnoses:  Hypoxia  Acute on chronic congestive heart failure, unspecified heart failure type ([place])     Rx / DC Orders   ED Discharge Orders     None        Note:  This document was prepared using Dragon voice recognition software and may include unintentional dictation errors.   Duffy Bruce, MD 05/03/22 [phone]

## 2022-05-03 NOTE — ED Notes (Signed)
Report given to [facility]

## 2022-05-03 NOTE — ED Notes (Signed)
Pt placed on 2L [place]

## 2022-05-03 NOTE — ED Triage Notes (Signed)
Pt here from [place] with a right pleural effusion, pt was told to come into the ED for evaluation. Pt denies pain but states some SOB. Pt stable in triage.

## 2022-05-03 NOTE — ED Triage Notes (Signed)
Coming from home with [facility] and wheezing- yesterday. Pt was seen at [facility] and was found to have a R pleural effusion.

## 2022-05-03 NOTE — ED Triage Notes (Signed)
Pt states after she strained to use the bathroom yesterday she felt SOB and has been going on ever since. States she has also noticed wheezing past few days.

## 2022-05-03 NOTE — ED Notes (Signed)
Patient is being discharged from the Urgent Care and sent to the Emergency Department via POV . Per Dr. Susa Simmonds, patient is in need of higher level of care due to Shortness of breath. Patient is aware and verbalizes understanding of plan of care.  Vitals:   05/03/22 1219  BP: 126/67  Pulse: 69  Resp: 20  Temp: 97.7 F (36.5 C)  SpO2: 95%

## 2022-05-03 NOTE — ED Notes (Signed)
Pt placed on O2 in triage. Pt states she just started having SOB for about the past 3 days.

## 2022-05-03 NOTE — ED Provider Notes (Signed)
[facility]    CSN: [id number] Arrival date & time: 05/03/22  1157      History   Chief Complaint Chief Complaint  Patient presents with   Wheezing   Shortness of Breath    HPI Heidi Baldwin is a 88 y.o. female.   HPI  Heidi Baldwin presents for breathing problems. Says she was constipation ans strained on the toilet a yesterday and then she started having shortness of breath. She used her son's albuterol inhaler which helped.  Her right leg has been swelling but are not more swollen than normal. Her shortness of breath with activity. She is able to lay flat at night. Started dry coughing yesterday.  Denies fever.    Past Medical History:  Diagnosis Date   Acid reflux    Allergy    Arthritis    Atrial fibrillation ([place])    Heart disease    Heart murmur    Hx of actinic keratosis May 2015   under left breast   Hypercholesterolemia    Hypertension    Hypothyroidism     Patient Active Problem List   Diagnosis Date Noted   Constipation 04/17/2021   Other thrombophilia ([place]) 04/17/2021   Chronic kidney disease, stage 3 unspecified ([place]) 12/05/2019   Weight loss 09/25/2017   Cough 09/05/2015   Renal cell carcinoma ([place]) 04/25/2015   Acid reflux 04/01/2015   Impaired vision 04/01/2015   Pleural effusion, left 03/27/2015   Health care maintenance 11/14/2014   Benign essential HTN 11/12/2014   Bradycardia 05/25/2014   MI (mitral incompetence) 05/25/2014   Sick sinus syndrome ([place]) 05/25/2014   Pulmonary hypertension ([place]) 05/25/2014   Arthritis of knee, degenerative 04/29/2014   Back pain 01/24/2014   Artificial cardiac pacemaker 12/10/2013   Left ventricular hypertrophy 10/20/2013   Beat, premature ventricular 10/20/2013   Skin lesion of breast 09/24/2013   Atrial fibrillation ([place]) 05/20/2013   Essential hypertension, benign 05/20/2013   Hypercholesterolemia 05/20/2013   Environmental allergies 05/20/2013   Hypothyroidism 05/20/2013   Arteriosclerosis of  coronary artery 03/28/2012    Past Surgical History:  Procedure Laterality Date   ABDOMINAL HYSTERECTOMY     CHOLECYSTECTOMY     CORNEAL TRANSPLANT     Right x 3 Left x1   CYST REMOVAL TRUNK     Tail bone   KNEE SURGERY Left    PACEMAKER INSERTION  05/03/13    OB History   No obstetric history on file.      Home Medications    Prior to Admission medications   Medication Sig Start Date End Date Taking? Authorizing Provider  amLODipine (NORVASC) 5 MG tablet TAKE 1 TABLET(5 MG) BY MOUTH DAILY 08/25/21   Einar Pheasant, MD  apixaban (ELIQUIS) 5 MG TABS tablet Take 5 mg by mouth 2 (two) times daily.     [provider]  carvedilol (COREG) 3.125 MG tablet Take 1 tablet by mouth in the morning and at bedtime. 11/05/19 11/04/20  [provider]  levothyroxine (SYNTHROID) 50 MCG tablet TAKE 1 TABLET BY MOUTH DAILY 30 TO 60 MINUTES BEFORE BREAKFAST ON AN EMPTY STOMACH AND WITH GLASS OF WATER 08/08/21   Einar Pheasant, MD  losartan (COZAAR) 100 MG tablet TAKE 1 TABLET BY MOUTH EVERY DAY 05/19/21   Einar Pheasant, MD  losartan (COZAAR) 50 MG tablet Take 1 tablet (50 mg total) by mouth daily. 11/29/21   Einar Pheasant, MD  pantoprazole (PROTONIX) 40 MG tablet TAKE 1 TABLET(40 MG) BY MOUTH TWICE DAILY  05/19/21   Einar Pheasant, MD  spironolactone (ALDACTONE) 25 MG tablet TAKE 1/2 TABLET(12.5 MG) BY MOUTH DAILY 04/30/22   Einar Pheasant, MD  traMADol (ULTRAM) 50 MG tablet TAKE 1 TABLET BY MOUTH 2 TO 3 TIMES DAILY AS NEEDED 04/23/22   Einar Pheasant, MD    Family History Family History  Problem Relation Age of Onset   Diabetes Mother    Heart disease Father    Hyperlipidemia Father    Heart disease Sister    Arthritis Son     Social History Social History   Tobacco Use   Smoking status: Never   Smokeless tobacco: Never  Vaping Use   Vaping Use: Never used  Substance Use Topics   Alcohol use: No    Alcohol/week: 0.0 standard drinks of alcohol   Drug use: No      Allergies   Statins and Sulfa antibiotics   Review of Systems Review of Systems : negative unless otherwise stated in HPI.      Physical Exam Triage Vital Signs ED Triage Vitals  Enc Vitals Group     BP 05/03/22 1219 126/67     Pulse Rate 05/03/22 1219 69     Resp 05/03/22 1219 20     Temp 05/03/22 1219 97.7 F (36.5 C)     Temp Source 05/03/22 1219 Oral     SpO2 05/03/22 1219 95 %     Weight 05/03/22 1214 145 lb (65.8 kg)     Height 05/03/22 1214 '5\' 4"'$  (1.626 m)     Head Circumference --      Peak Flow --      Pain Score 05/03/22 1214 0     Pain Loc --      Pain Edu? --      Excl. in [place]? --    No data found.  Updated Vital Signs BP 126/67 (BP Location: Left Arm)   Pulse 69   Temp 97.7 F (36.5 C) (Oral)   Resp 20   Ht '5\' 4"'$  (1.626 m)   Wt 65.8 kg   SpO2 95%   BMI 24.89 kg/m   Visual Acuity Right Eye Distance:   Left Eye Distance:   Bilateral Distance:    Right Eye Near:   Left Eye Near:    Bilateral Near:     Physical Exam  GEN: alert, well non-ill female, in no acute distress     HENT:  mucus membranes moist,  EYES:   pupils equal and reactive, EOM intact RESP:  no increased work of breathing, left rales to mid lung and somewhat at the right lung base CVS:   regular rate and rhythm, distal pulses intact EXT:    2+ pitting edema to the mid-shin NEURO:  speech normal, alert and oriented at baseline Skin:   warm and dry Psych: Normal affect, appropriate speech and behavior    UC Treatments / Results  Labs (all labs ordered are listed, but only abnormal results are displayed) Labs Reviewed - No data to display  EKG   Radiology DG Chest 2 View  Result Date: 05/03/2022 CLINICAL DATA:  Shortness of breath EXAM: CHEST - 2 VIEW COMPARISON:  Chest radiograph dated 04/02/2013. FINDINGS: The heart is borderline enlarged. Vascular calcifications are seen in the aortic arch. There is a small to moderate right pleural effusion with associated  atelectasis/airspace disease. The left lung is clear with no pleural effusion. There is no pneumothorax. A left subclavian approach cardiac device is redemonstrated. Degenerative changes  are seen in the spine. IMPRESSION: Small to moderate right pleural effusion with associated atelectasis/airspace disease. Aortic Atherosclerosis (ICD10-I70.0). Electronically Signed   By: Zerita Boers M.D.   On: 05/03/2022 12:56    Procedures Procedures (including critical care time)  Medications Ordered in UC Medications - No data to display  Initial Impression / Assessment and Plan / UC Course  I have reviewed the triage vital signs and the nursing notes.  Pertinent labs & imaging results that were available during my care of the patient were reviewed by me and considered in my medical decision making (see chart for details).     Pt is a 88 yo female who presents for 2 days of dyspnea. She has history of vavular disease, pulm hypertension, AFIB (Eliquis), HTN and cancer. On chart review, her last ECHO was in May 2019 that showed MR and TR with EF >55%.  She follows with cardiology. Has a pacemaker last interrogated in July.   Overall, patient is well-appearing, well-hydrated and in no acute distress.  She appears hypervolemic. She is satting adequately on room air. Chest xray personally reviewed by me without focal pneumonia, cardiomegaly or pneumothorax but showing pleural effusion on the left. Radiologist notes small to moderate right pleural effusion. She has history of left pleural effusion. Last ECHO in 2019. Recommended ED evaluation however she does not want to go to the ED or be admitted. Risks and benefits discussed and she is willing to have her son drive her to the ED.  Spoke with triage RN at [facility] who will await pt's arrival.     Final Clinical Impressions(s) / UC Diagnoses   Final diagnoses:  Pleural effusion  Shortness of breath     Discharge Instructions      You have fluid in your  lungs on the right side.   You have been advised to follow up immediately in the emergency department for concerning signs or symptoms as discussed during your visit. If you declined EMS transport, please have a family member take you directly to the ED at this time. Do not delay.   Based on concerns about condition, if you do not follow up in the ED, you may risk poor outcomes including worsening of condition, delayed treatment and potentially life threatening issues. If you have declined to go to the ED at this time, you should call your PCP immediately to set up a follow up appointment.   Go to ED for red flag symptoms, including; fevers you cannot reduce with Tylenol/Motrin, severe headaches, vision changes, numbness/weakness in part of the body, lethargy, confusion, intractable vomiting, severe dehydration, chest pain, breathing difficulty, severe persistent abdominal or pelvic pain, signs of severe infection (increased redness, swelling of an area), feeling faint or passing out, dizziness, etc. You should especially go to the ED for sudden acute worsening of condition if you do not elect to go at this time.       ED Prescriptions   None    PDMP not reviewed this encounter.   Lyndee Hensen, DO 05/03/22 1452

## 2022-05-03 NOTE — H&P (Signed)
History and Physical    Heidi Baldwin XLK:[id number] DOB: [birth year] DOA: 05/03/2022  PCP: Einar Pheasant, MD  Patient coming from: home   Chief Complaint: dyspnea  HPI: Heidi Baldwin is a 88 y.o. female with medical history significant for a fib, htn, sick sinus s/p pacemaker, moderate mitral and tricuspid regurg, phtn, hypothyroid, htn, ckd 3b, who presents with the above.  Reports several days intermittent dyspnea. No fever or cough or chest pain. No syncope or presyncope. Does have some swelling. Is worse with lying down.   Per EDP patient was hypoxic to 89 on room air in the ED and dyspneic with ambulation.  She reports her pacemaker was interrogated last week, hasn't received report    Review of Systems: As per HPI otherwise 10 point review of systems negative.    Past Medical History:  Diagnosis Date   Acid reflux    Allergy    Arthritis    Atrial fibrillation (HCC)    Heart disease    Heart murmur    Hx of actinic keratosis May 2015   under left breast   Hypercholesterolemia    Hypertension    Hypothyroidism     Past Surgical History:  Procedure Laterality Date   ABDOMINAL HYSTERECTOMY     CHOLECYSTECTOMY     CORNEAL TRANSPLANT     Right x 3 Left x1   CYST REMOVAL TRUNK     Tail bone   KNEE SURGERY Left    PACEMAKER INSERTION  05/03/13     reports that she has never smoked. She has never used smokeless tobacco. She reports that she does not drink alcohol and does not use drugs.  Allergies  Allergen Reactions   Statins Other (See Comments)    weakness   Sulfa Antibiotics     Caused increased intraocular pressure    Family History  Problem Relation Age of Onset   Diabetes Mother    Heart disease Father    Hyperlipidemia Father    Heart disease Sister    Arthritis Son     Prior to Admission medications   Medication Sig Start Date End Date Taking? Authorizing Provider  amLODipine (NORVASC) 5 MG tablet TAKE 1 TABLET(5 MG) BY MOUTH DAILY 08/25/21   Yes Einar Pheasant, MD  apixaban (ELIQUIS) 5 MG TABS tablet Take 5 mg by mouth 2 (two) times daily.    Yes [provider]  levothyroxine (SYNTHROID) 50 MCG tablet TAKE 1 TABLET BY MOUTH DAILY 30 TO 60 MINUTES BEFORE BREAKFAST ON AN EMPTY STOMACH AND WITH GLASS OF WATER Patient taking differently: Take 50 mcg by mouth daily before breakfast. 08/08/21  Yes Einar Pheasant, MD  losartan (COZAAR) 50 MG tablet Take 1 tablet (50 mg total) by mouth daily. 11/29/21  Yes Einar Pheasant, MD  pantoprazole (PROTONIX) 40 MG tablet TAKE 1 TABLET(40 MG) BY MOUTH TWICE DAILY 05/19/21  Yes Einar Pheasant, MD  spironolactone (ALDACTONE) 25 MG tablet TAKE 1/2 TABLET(12.5 MG) BY MOUTH DAILY 04/30/22  Yes Einar Pheasant, MD  traMADol (ULTRAM) 50 MG tablet TAKE 1 TABLET BY MOUTH 2 TO 3 TIMES DAILY AS NEEDED 04/23/22  Yes Einar Pheasant, MD  carvedilol (COREG) 3.125 MG tablet Take 1 tablet by mouth in the morning and at bedtime. 11/05/19 11/04/20  [provider]    Physical Exam: Vitals:   05/03/22 1417 05/03/22 1600  BP: (!) 150/70 (!) 144/69  Pulse: 81 72  Resp: 18 17  Temp: 97.6 F (36.4 C)   TempSrc:  Oral   SpO2: 90% 99%  Weight: 66.7 kg     Constitutional: No acute distress Head: Atraumatic Eyes: Conjunctiva clear ENM: Moist mucous membranes. Normal dentition.  Neck: Supple Respiratory: Clear to auscultation bilaterally, no wheezing/rales/rhonchi. Normal respiratory effort. Decreased breath sounds in bases Cardiovascular: Regular rate and rhythm. Soft systolic murmur Abdomen: Non-tender, non-distended. No masses. No rebound or guarding. Positive bowel sounds. Musculoskeletal: No joint deformity upper and lower extremities. Normal ROM, no contractures. Normal muscle tone.  Skin: No rashes, lesions, or ulcers.  Extremities: moderate LE edema Neurologic: Alert, moving all 4 extremities. Psychiatric: Normal insight and judgement.   Labs on Admission: I have personally reviewed  following labs and imaging studies  CBC: Recent Labs  Lab 05/03/22 1425  WBC 8.7  NEUTROABS 7.1  HGB 13.6  HCT 40.2  MCV 85.5  PLT 628   Basic Metabolic Panel: Recent Labs  Lab 05/03/22 1425  NA 128*  K 4.5  CL 92*  CO2 28  GLUCOSE 114*  BUN 18  CREATININE 0.86  CALCIUM 9.5   GFR: Estimated Creatinine Clearance: 35.9 mL/min (by C-G formula based on SCr of 0.86 mg/dL). Liver Function Tests: Recent Labs  Lab 05/03/22 1425  AST 24  ALT 15  ALKPHOS 62  BILITOT 1.6*  PROT 7.9  ALBUMIN 4.5   No results for input(s): "LIPASE", "AMYLASE" in the last 168 hours. No results for input(s): "AMMONIA" in the last 168 hours. Coagulation Profile: No results for input(s): "INR", "PROTIME" in the last 168 hours. Cardiac Enzymes: No results for input(s): "CKTOTAL", "CKMB", "CKMBINDEX", "TROPONINI" in the last 168 hours. BNP (last 3 results) No results for input(s): "PROBNP" in the last 8760 hours. HbA1C: No results for input(s): "HGBA1C" in the last 72 hours. CBG: No results for input(s): "GLUCAP" in the last 168 hours. Lipid Profile: No results for input(s): "CHOL", "HDL", "LDLCALC", "TRIG", "CHOLHDL", "LDLDIRECT" in the last 72 hours. Thyroid Function Tests: No results for input(s): "TSH", "T4TOTAL", "FREET4", "T3FREE", "THYROIDAB" in the last 72 hours. Anemia Panel: No results for input(s): "VITAMINB12", "FOLATE", "FERRITIN", "TIBC", "IRON", "RETICCTPCT" in the last 72 hours. Urine analysis:    Component Value Date/Time   COLORURINE YELLOW 06/23/2019 1424   APPEARANCEUR CLEAR 06/23/2019 1424   APPEARANCEUR Clear 04/25/2015 1427   LABSPEC 1.015 06/23/2019 1424   PHURINE 6.0 06/23/2019 1424   GLUCOSEU NEGATIVE 06/23/2019 1424   HGBUR MODERATE (A) 06/23/2019 1424   BILIRUBINUR NEGATIVE 06/23/2019 1424   BILIRUBINUR Negative 04/25/2015 1427   KETONESUR NEGATIVE 06/23/2019 1424   PROTEINUR Negative 04/25/2015 1427   UROBILINOGEN 0.2 06/23/2019 1424   NITRITE NEGATIVE  06/23/2019 1424   LEUKOCYTESUR MODERATE (A) 06/23/2019 1424    Radiological Exams on Admission: DG Chest 2 View  Result Date: 05/03/2022 CLINICAL DATA:  Shortness of breath EXAM: CHEST - 2 VIEW COMPARISON:  Chest radiograph dated 04/02/2013. FINDINGS: The heart is borderline enlarged. Vascular calcifications are seen in the aortic arch. There is a small to moderate right pleural effusion with associated atelectasis/airspace disease. The left lung is clear with no pleural effusion. There is no pneumothorax. A left subclavian approach cardiac device is redemonstrated. Degenerative changes are seen in the spine. IMPRESSION: Small to moderate right pleural effusion with associated atelectasis/airspace disease. Aortic Atherosclerosis (ICD10-I70.0). Electronically Signed   By: Zerita Boers M.D.   On: 05/03/2022 12:56    EKG: Independently reviewed. V paced  Assessment/Plan Principal Problem:   CHF exacerbation [facility]) Active Problems:   Atrial fibrillation ([place])   Essential  hypertension, benign   Hypothyroidism   Artificial cardiac pacemaker   Pulmonary hypertension (HCC)   # Hypoxic respiratory failure Hypoxic to 89 in the ED and dyspneic with ambulation. Satting normally and breathing normally on 2 L - [place] O2, wean as ablve  # CHF with acute exacerbation # Pleural effusion Known moderate mitral and tricuspid regurg, pHTN. Here w/ pleural effusion, LE edema, elevated bnp. Trop normal and no chest pain. Received lasix 20 IV in the ED - continue lasix 20 IV qd, hold home losartan to give room for diuresis - cont home coreg, spiro - strict I/os - tte - CT to further eval effusion - f/u covid and dimer and second trop  # Sick sinus Has pacemaker, says was interrogated recently - will see about interrogating pacemaker given above symptoms  # Hyponatremia Na 128. Chronic, baseline appears to be low 130s. Suspect SIADH - check tsh, am cortisol, urine osm and na, serum osm  # Chronic  pain - home tramadol prn  # HTN Here mild elevation - home amlod and coreg and spiro - lasix as above - holding home losartan   # Hypothyroid - f/u tsh - cont home levothyroxine  # A-fib - cont home coreg and apixaban  DVT prophylaxis: home apixaban Code Status: dnr  Family Communication: son buster updated telephonically 10/12 Consults called: none   Level of care: Telemetry Medical Status is: Inpatient Remains inpatient appropriate because: need for ongoing inpt eval, IV treatments    Desma Maxim MD [facility] Pager [phone]  If 7PM-7AM, please contact night-coverage [email or web address] Password [email or web address]  05/03/2022, 5:12 PM

## 2022-05-03 NOTE — ED Notes (Signed)
The pt was assisted with dressing ina gown and placed on purwick.

## 2022-05-04 ENCOUNTER — Inpatient Hospital Stay: Payer: Medicare HMO

## 2022-05-04 ENCOUNTER — Inpatient Hospital Stay
Admit: 2022-05-04 | Discharge: 2022-05-04 | Disposition: A | Discharge status: Home / Self Care | Payer: Medicare HMO | Attending: Obstetrics and Gynecology | Admitting: Obstetrics and Gynecology

## 2022-05-04 DIAGNOSIS — I509 Heart failure, unspecified: Secondary | ICD-10-CM | POA: Diagnosis not present

## 2022-05-04 LAB — BASIC METABOLIC PANEL
Anion gap: 11 (ref 5–15)
BUN: 15 mg/dL (ref 8–23)
CO2: 29 mmol/L (ref 22–32)
Calcium: 9.4 mg/dL (ref 8.9–10.3)
Chloride: 92 mmol/L — ABNORMAL LOW (ref 98–111)
Creatinine, Ser: 0.87 mg/dL (ref 0.44–1.00)
GFR, Estimated: >60 mL/min (ref >60)
Glucose, Bld: 94 mg/dL (ref 70–99)
Potassium: 3.9 mmol/L (ref 3.5–5.1)
Sodium: 132 mmol/L — ABNORMAL LOW (ref 135–145)

## 2022-05-04 LAB — ECHOCARDIOGRAM COMPLETE
AR max vel: 2.43 cm2
AV Area VTI: 2.32 cm2
AV Area mean vel: 2.36 cm2
AV Mean grad: 5.5 mmHg
AV Peak grad: 11.2 mmHg
Ao pk vel: 1.68 m/s
Area-P 1/2: 2.39 cm2
Calc EF: 59.9 %
Height: 64 in
MV M vel: 3.48 m/s
MV Peak grad: 48.3 mmHg
MV VTI: 1.9 cm2
S' Lateral: 2.7 cm
Single Plane A2C EF: 62.2 %
Single Plane A4C EF: 55.8 %
Weight: 2352 oz

## 2022-05-04 LAB — CBC
HCT: 39.2 % (ref 36.0–46.0)
Hemoglobin: 13.1 g/dL (ref 12.0–15.0)
MCH: 28.5 pg (ref 26.0–34.0)
MCHC: 33.4 g/dL (ref 30.0–36.0)
MCV: 85.2 fL (ref 80.0–100.0)
Platelets: 226 10*3/uL (ref 150–400)
RBC: 4.6 MIL/uL (ref 3.87–5.11)
RDW: 12.5 % (ref 11.5–15.5)
WBC: 9.6 10*3/uL (ref 4.0–10.5)
nRBC: 0 % (ref 0.0–0.2)

## 2022-05-04 LAB — PROTEIN, PLEURAL OR PERITONEAL FLUID: Total protein, fluid: <3 g/dL

## 2022-05-04 LAB — LACTATE DEHYDROGENASE, PLEURAL OR PERITONEAL FLUID: LD, Fluid: 52 U/L — ABNORMAL HIGH (ref 3–23)

## 2022-05-04 LAB — D-DIMER, QUANTITATIVE: D-Dimer, Quant: 2.12 ug/mL-FEU — ABNORMAL HIGH (ref 0.00–0.50)

## 2022-05-04 LAB — BODY FLUID CELL COUNT WITH DIFFERENTIAL
Eos, Fluid: 0 %
Lymphs, Fluid: 47 %
Monocyte-Macrophage-Serous Fluid: 35 %
Neutrophil Count, Fluid: 18 %
Total Nucleated Cell Count, Fluid: 210 cu mm

## 2022-05-04 LAB — TSH: TSH: 3.549 u[IU]/mL (ref 0.350–4.500)

## 2022-05-04 LAB — PROTEIN, TOTAL: Total Protein: 6.9 g/dL (ref 6.5–8.1)

## 2022-05-04 LAB — GLUCOSE, PLEURAL OR PERITONEAL FLUID: Glucose, Fluid: 100 mg/dL

## 2022-05-04 LAB — OSMOLALITY: Osmolality: 268 mOsm/kg — ABNORMAL LOW (ref 275–295)

## 2022-05-04 LAB — CORTISOL-AM, BLOOD: Cortisol - AM: 27.9 ug/dL — ABNORMAL HIGH (ref 6.7–22.6)

## 2022-05-04 MED ORDER — APIXABAN 5 MG PO TABS
5.0000 mg | ORAL_TABLET | Freq: Two times a day (BID) | ORAL | Status: DC
  Administered 2022-05-04 – 2022-05-05 (×2): 5 mg via ORAL
  Filled 2022-05-04 (×2): qty 1

## 2022-05-04 MED ORDER — FUROSEMIDE 10 MG/ML IJ SOLN
20.0000 mg | Freq: Two times a day (BID) | INTRAMUSCULAR | Status: DC
  Administered 2022-05-04: 20 mg via INTRAVENOUS
  Filled 2022-05-04: qty 4

## 2022-05-04 NOTE — Consult Note (Signed)
   Heart Failure Nurse Navigator Note  Tempted x3 to see patient today in the ED.  She was either asleep, off the floor for thoracentesis or procedure being done.  We will attempt to see her at another time.  Pricilla Riffle RN [facility]

## 2022-05-04 NOTE — ED Notes (Signed)
Patient placed in hospital bed

## 2022-05-04 NOTE — ED Notes (Signed)
Dietary called for breakfast tray at this time. Hospital bed requested by patient- secretary placing request.

## 2022-05-04 NOTE — ED Notes (Signed)
Patient's brief checked and noted to be wet. Patient's brief changed and purewick applied.

## 2022-05-04 NOTE — ED Notes (Signed)
Patient's brief wet. Patient cleaned with new brief and chuck pad placed at this time. No new needs expressed to RN.

## 2022-05-04 NOTE — ED Notes (Signed)
Attempted to interrogate pacemaker. This RN and a second RN attempted but was not picked up by the [employer] scanner. Wouk, MD made aware.

## 2022-05-04 NOTE — ED Notes (Signed)
Transportation requested  

## 2022-05-04 NOTE — Discharge Instructions (Signed)

## 2022-05-04 NOTE — Procedures (Signed)
PROCEDURE SUMMARY:  Successful image-guided right thoracentesis. Yielded 1.2L of yellow fluid. Pt tolerated procedure well. No immediate complications. EBL = trace   Specimen was  sent for labs. CXR ordered.  Please see imaging section of Epic for full dictation.  Lura Em PA-C 05/04/2022 2:34 PM

## 2022-05-04 NOTE — ED Notes (Signed)
Patient to [place] for thoracentesis at this time.

## 2022-05-04 NOTE — Progress Notes (Signed)
PROGRESS NOTE    Heidi Baldwin  UXL:[id number] DOB: 20-Sep-1923 DOA: 05/03/2022 PCP: Einar Pheasant, MD  Outpatient Specialists: cardiology    Brief Narrative:   From admission h and p Heidi Baldwin is a 88 y.o. female with medical history significant for a fib, htn, sick sinus s/p pacemaker, moderate mitral and tricuspid regurg, phtn, hypothyroid, htn, ckd 3b, who presents with the above.   Reports several days intermittent dyspnea. No fever or cough or chest pain. No syncope or presyncope. Does have some swelling. Is worse with lying down.    Per EDP patient was hypoxic to 89 on room air in the ED and dyspneic with ambulation.   She reports her pacemaker was interrogated last week, hasn't received report   Assessment & Plan:   Principal Problem:   CHF exacerbation ([place]) Active Problems:   Atrial fibrillation ([place])   Essential hypertension, benign   Hypothyroidism   Artificial cardiac pacemaker   Pulmonary hypertension ([place])   Hypoxia   Hyponatremia   # Hypoxic respiratory failure Hypoxic to 89 in the ED and dyspneic with ambulation. Satting normally and breathing normally on 2 L - [place] O2, wean as ablve   # CHF with acute exacerbation # Pleural effusion Known moderate mitral and tricuspid regurg, pHTN. Here w/ pleural effusion, LE edema, elevated bnp. Trop normal and no chest pain. Received lasix 20 IV in the ED. CT shows large b/l pleural effusions. Unlikely PE given compliance w/ home apixaban. Covid negative - continue lasix 20 IV qd, hold home losartan to give room for diuresis - cont home coreg, spiro - strict I/os - tte - IR thoracentesis, diagnostic/therpaeutic   # Sick sinus Has pacemaker, says was interrogated recently - will see about interrogating pacemaker given above symptoms   # Hyponatremia Na 128. Chronic, baseline appears to be low 130s. Suspect SIADH with urine osm and urine sodium elevated. Repeat sodium today 132 which is in line with baseline.  TSH Wnl - f/u cortisol   # Chronic pain - home tramadol prn  # Renal mass Known to patient, has been told by urology likely [place], has been advised to monitor   # HTN Here mild elevation - home amlod and coreg and spiro - lasix as above - holding home losartan    # Hypothyroid - f/u tsh - cont home levothyroxine   # A-fib - cont home coreg and apixaban   DVT prophylaxis: home apixaban Code Status: dnr Family Communication: son updated @ bedside  Level of care: Telemetry Medical Status is: Inpatient Remains inpatient appropriate because: need for inpatient eval    Consultants:  none  Procedures: pending  Antimicrobials:  none    Subjective: This morning back hurts from hospital bed, otherwise no complaints  Objective: Vitals:   05/04/22 0600 05/04/22 0609 05/04/22 0700 05/04/22 0742  BP: (!) 150/59  (!) 143/60 (!) 152/69  Pulse: 62  64 77  Resp: (!) 22  (!) 22 (!) 21  Temp:  98 F (36.7 C)    TempSrc:  Oral    SpO2: 96%  99% 98%  Weight:        Intake/Output Summary (Last 24 hours) at 05/04/2022 0910 Last data filed at 05/04/2022 5366 Gross per 24 hour  Intake --  Output 500 ml  Net -500 ml   Filed Weights   05/03/22 1417  Weight: 66.7 kg    Examination:  Constitutional: No acute distress Head: Atraumatic Eyes: Conjunctiva clear ENM: Moist mucous membranes.  Normal dentition.  Neck: Supple Respiratory: Clear to auscultation bilaterally, no wheezing/rales/rhonchi. Normal respiratory effort. Decreased breath sounds in bases Cardiovascular: Regular rate and rhythm. Soft systolic murmur Abdomen: Non-tender, non-distended. No masses. No rebound or guarding. Positive bowel sounds. Musculoskeletal: No joint deformity upper and lower extremities. Normal ROM, no contractures. Normal muscle tone.  Skin: No rashes, lesions, or ulcers.  Extremities: moderate LE edema Neurologic: Alert, moving all 4 extremities. Psychiatric: Normal insight and  judgement.    Data Reviewed: I have personally reviewed following labs and imaging studies  CBC: Recent Labs  Lab 05/03/22 1425 05/04/22 0535  WBC 8.7 9.6  NEUTROABS 7.1  --   HGB 13.6 13.1  HCT 40.2 39.2  MCV 85.5 85.2  PLT 226 269   Basic Metabolic Panel: Recent Labs  Lab 05/03/22 1425 05/04/22 0535  NA 128* 132*  K 4.5 3.9  CL 92* 92*  CO2 28 29  GLUCOSE 114* 94  BUN 18 15  CREATININE 0.86 0.87  CALCIUM 9.5 9.4   GFR: Estimated Creatinine Clearance: 35.5 mL/min (by C-G formula based on SCr of 0.87 mg/dL). Liver Function Tests: Recent Labs  Lab 05/03/22 1425  AST 24  ALT 15  ALKPHOS 62  BILITOT 1.6*  PROT 7.9  ALBUMIN 4.5   No results for input(s): "LIPASE", "AMYLASE" in the last 168 hours. No results for input(s): "AMMONIA" in the last 168 hours. Coagulation Profile: No results for input(s): "INR", "PROTIME" in the last 168 hours. Cardiac Enzymes: No results for input(s): "CKTOTAL", "CKMB", "CKMBINDEX", "TROPONINI" in the last 168 hours. BNP (last 3 results) No results for input(s): "PROBNP" in the last 8760 hours. HbA1C: No results for input(s): "HGBA1C" in the last 72 hours. CBG: No results for input(s): "GLUCAP" in the last 168 hours. Lipid Profile: No results for input(s): "CHOL", "HDL", "LDLCALC", "TRIG", "CHOLHDL", "LDLDIRECT" in the last 72 hours. Thyroid Function Tests: Recent Labs    05/03/22 1707  TSH 3.549   Anemia Panel: No results for input(s): "VITAMINB12", "FOLATE", "FERRITIN", "TIBC", "IRON", "RETICCTPCT" in the last 72 hours. Urine analysis:    Component Value Date/Time   COLORURINE YELLOW 06/23/2019 1424   APPEARANCEUR CLEAR 06/23/2019 1424   APPEARANCEUR Clear 04/25/2015 1427   LABSPEC 1.015 06/23/2019 1424   PHURINE 6.0 06/23/2019 1424   GLUCOSEU NEGATIVE 06/23/2019 1424   HGBUR MODERATE (A) 06/23/2019 1424   BILIRUBINUR NEGATIVE 06/23/2019 1424   BILIRUBINUR Negative 04/25/2015 1427   KETONESUR NEGATIVE 06/23/2019  1424   PROTEINUR Negative 04/25/2015 1427   UROBILINOGEN 0.2 06/23/2019 1424   NITRITE NEGATIVE 06/23/2019 1424   LEUKOCYTESUR MODERATE (A) 06/23/2019 1424   Sepsis Labs: '@LABRCNTIP'$ (procalcitonin:4,lacticidven:4)  ) Recent Results (from the past 240 hour(s))  SARS Coronavirus 2 by RT PCR (hospital order, performed in [place] hospital lab) *cepheid single result test* Anterior Nasal Swab     Status: None   Collection Time: 05/03/22  5:01 PM   Specimen: Anterior Nasal Swab  Result Value Ref Range Status   SARS Coronavirus 2 by RT PCR NEGATIVE NEGATIVE Final    Comment: (NOTE) SARS-CoV-2 target nucleic acids are NOT DETECTED.  The SARS-CoV-2 RNA is generally detectable in upper and lower respiratory specimens during the acute phase of infection. The lowest concentration of SARS-CoV-2 viral copies this assay can detect is 250 copies / mL. A negative result does not preclude SARS-CoV-2 infection and should not be used as the sole basis for treatment or other patient management decisions.  A negative result may occur with  improper specimen collection / handling, submission of specimen other than nasopharyngeal swab, presence of viral mutation(s) within the areas targeted by this assay, and inadequate number of viral copies (<250 copies / mL). A negative result must be combined with clinical observations, patient history, and epidemiological information.  Fact Sheet for Patients:   https://www.patel.info/  Fact Sheet for Healthcare Providers: https://hall.com/  This test is not yet approved or  cleared by the [place] FDA and has been authorized for detection and/or diagnosis of SARS-CoV-2 by FDA under an Emergency Use Authorization (EUA).  This EUA will remain in effect (meaning this test can be used) for the duration of the COVID-19 declaration under Section 564(b)(1) of the Act, 21 U.S.C. section 360bbb-3(b)(1), unless the  authorization is terminated or revoked sooner.  Performed at [facility], [address] [place], [place] [address]          Radiology Studies: CT CHEST WO CONTRAST  Result Date: 05/03/2022 CLINICAL DATA:  Pleural effusion, malignancy suspected EXAM: CT CHEST WITHOUT CONTRAST TECHNIQUE: Multidetector CT imaging of the chest was performed following the standard protocol without IV contrast. RADIATION DOSE REDUCTION: This exam was performed according to the departmental dose-optimization program which includes automated exposure control, adjustment of the mA and/or kV according to patient size and/or use of iterative reconstruction technique. COMPARISON:  CT abdomen and pelvis 03/04/2015 FINDINGS: Cardiovascular: Cardiac enlargement. Calcification in the mitral valve annulus. Coronary artery and aortic calcification. No aortic aneurysm. Mediastinum/Nodes: Esophagus is decompressed. Thyroid gland is unremarkable. Mediastinal lymph nodes are not pathologically enlarged, likely reactive. Cardiac pacemaker. Lungs/Pleura: Large bilateral pleural effusions with bilateral basilar infiltrates. This likely represents compressive atelectasis but could indicate multifocal pneumonia in the appropriate clinical setting. Bronchial wall thickening consistent with chronic bronchitis. No pneumothorax. Upper Abdomen: Large heterogeneous mass in the upper pole of the right kidney measuring 7.5 x 8.6 cm. This is enlarging since the previous study and likely represents renal cell carcinoma. Characterization is limited on noncontrast imaging. The mass is incompletely included within the field of view. Surgical absence of the gallbladder. Musculoskeletal: Degenerative changes.  No focal bone lesions. IMPRESSION: 1. Large bilateral pleural effusions with basilar atelectasis or consolidation. 2. 8.6 cm diameter solid mass in the upper pole of the right kidney is enlarging since prior study and likely represents  renal cell carcinoma. 3. Cardiac enlargement. Calcification of the mitral valve annulus. Aortic atherosclerosis. Electronically Signed   By: Lucienne Capers M.D.   On: 05/03/2022 22:05   DG Chest 2 View  Result Date: 05/03/2022 CLINICAL DATA:  Shortness of breath EXAM: CHEST - 2 VIEW COMPARISON:  Chest radiograph dated 04/02/2013. FINDINGS: The heart is borderline enlarged. Vascular calcifications are seen in the aortic arch. There is a small to moderate right pleural effusion with associated atelectasis/airspace disease. The left lung is clear with no pleural effusion. There is no pneumothorax. A left subclavian approach cardiac device is redemonstrated. Degenerative changes are seen in the spine. IMPRESSION: Small to moderate right pleural effusion with associated atelectasis/airspace disease. Aortic Atherosclerosis (ICD10-I70.0). Electronically Signed   By: Zerita Boers M.D.   On: 05/03/2022 12:56        Scheduled Meds:  amLODipine  5 mg Oral Daily   apixaban  5 mg Oral BID   carvedilol  3.125 mg Oral BID WC   furosemide  20 mg Intravenous Daily   levothyroxine  50 mcg Oral QAC breakfast   pantoprazole  40 mg Oral Daily   sodium chloride flush  3 mL Intravenous Q12H   spironolactone  12.5 mg Oral Daily   Continuous Infusions:  sodium chloride       LOS: 1 day     Desma Maxim, MD [facility]   If 7PM-7AM, please contact night-coverage [email or web address] Password [email or web address] 05/04/2022, 9:10 AM

## 2022-05-04 NOTE — ED Notes (Signed)
Patient's brief changed at this time and purewick reapplied. No needs expressed to RN at this time.

## 2022-05-04 NOTE — Progress Notes (Signed)
Nutrition Brief Note  RD pulled to chart secondary to CHF.   Wt Readings from Last 15 Encounters:  05/03/22 66.7 kg  05/03/22 65.8 kg  08/24/21 68.7 kg  04/14/21 69.3 kg  10/17/20 68 kg  06/28/20 68.5 kg  02/25/20 69.4 kg  12/01/19 67.2 kg  05/29/19 69.1 kg  01/26/19 68 kg  09/25/18 69.2 kg  05/02/18 70.4 kg  12/27/17 70.2 kg  09/23/17 70.1 kg  04/25/17 74.3 kg   Pt with medical history significant for a fib, htn, sick sinus s/p pacemaker, moderate mitral and tricuspid regurg, phtn, hypothyroid, htn, ckd 3b, who presents with dyspnea.    Pt admitted with CHF exacerbation.   RD provided "Low Sodium Nutrition Therapy" handout from [facility] Nutrition Care Manual; attached to AVS/ discharge summary.   Current diet order is NPO, patient is consuming approximately n/a% of meals at this time. Labs and medications reviewed.   No nutrition interventions warranted at this time. If nutrition issues arise, please consult RD.   Loistine Chance, RD, LDN, [place] Registered Dietitian II Certified Diabetes Care and Education Specialist Please refer to [facility] for RD and/or RD on-call/weekend/after hours pager

## 2022-05-05 DIAGNOSIS — I5041 Acute combined systolic (congestive) and diastolic (congestive) heart failure: Secondary | ICD-10-CM | POA: Diagnosis not present

## 2022-05-05 LAB — LACTATE DEHYDROGENASE: LDH: 159 U/L (ref 98–192)

## 2022-05-05 LAB — BASIC METABOLIC PANEL
Anion gap: 16 — ABNORMAL HIGH (ref 5–15)
BUN: 23 mg/dL (ref 8–23)
CO2: 26 mmol/L (ref 22–32)
Calcium: 9.1 mg/dL (ref 8.9–10.3)
Chloride: 91 mmol/L — ABNORMAL LOW (ref 98–111)
Creatinine, Ser: 1.11 mg/dL — ABNORMAL HIGH (ref 0.44–1.00)
GFR, Estimated: 45 mL/min — ABNORMAL LOW (ref >60)
Glucose, Bld: 59 mg/dL — ABNORMAL LOW (ref 70–99)
Potassium: 3.5 mmol/L (ref 3.5–5.1)
Sodium: 133 mmol/L — ABNORMAL LOW (ref 135–145)

## 2022-05-05 LAB — GLUCOSE, CAPILLARY: Glucose-Capillary: 67 mg/dL — ABNORMAL LOW (ref 70–99)

## 2022-05-05 LAB — PHOSPHORUS: Phosphorus: 4.8 mg/dL — ABNORMAL HIGH (ref 2.5–4.6)

## 2022-05-05 LAB — MAGNESIUM: Magnesium: 1.8 mg/dL (ref 1.7–2.4)

## 2022-05-05 MED ORDER — FUROSEMIDE 20 MG PO TABS: 20.0000 mg | ORAL_TABLET | Freq: Every day | ORAL | 11 refills | Status: DC

## 2022-05-05 MED ORDER — FUROSEMIDE 20 MG PO TABS: 20.0000 mg | ORAL_TABLET | Freq: Every day | ORAL | 11 refills | Status: AC

## 2022-05-05 NOTE — Evaluation (Signed)
Physical Therapy Evaluation Patient Details Name: Heidi Baldwin MRN: [id number] DOB: 1923/07/25 Today's Date: 05/05/2022  History of Present Illness   Pt is a 88 year old female with date of admission on 05/03/2022 with diagnosis of Hypoxia with sats down to 89% on 05/03/2022 and several day history of dyspnea. Patient has past medical history including Atrial fib, Hypertension, sick sinus s/p pacemaker, moderate mitral and tricuspid Regurg, Pulmon HTN, Hypothyroid, Chronic kidney disease stage III.     Clinical Impression  Patient presents as alert and oriented in good spirits this am- agreeable to evaluation. Her son lives with her and her PLOF was independent. She presented with good ability to perform all functional mobility- independent with transfers, bed mobility, and 5/5 B UE/LE strength. She was unsteady some with walking today requiring HHA +1 and presented without dyspnea and O2 sat remained 97% or higher on room air after walking 200 feet.  Pt would benefit from skilled PT to address noted impairments (imbalance) and functional limitations .  Upon hospital discharge, pt should not need any PT services yet will continue to monitor her needs as appropriate.    Recommendations for follow up therapy are one component of a multi-disciplinary discharge planning process, led by the attending physician.  Recommendations may be updated based on patient status, additional functional criteria and insurance authorization.  Follow Up Recommendations Follow physician's recommendations for discharge plan and follow up therapies      Assistance Recommended at Discharge Intermittent Supervision/Assistance  Patient can return home with the following  A little help with walking and/or transfers    Equipment Recommendations    Recommendations for Other Services       Functional Status Assessment Patient has had a recent decline in their functional status and demonstrates the ability to make  significant improvements in function in a reasonable and predictable amount of time.     Precautions / Restrictions Precautions Precautions: Fall Restrictions Weight Bearing Restrictions: No      Mobility  Bed Mobility Overal bed mobility: Independent               Patient Response: Cooperative  Transfers Overall transfer level: Independent Equipment used: None                    Ambulation/Gait Ambulation/Gait assistance: Counsellor (Feet): 200 Feet Assistive device: 1 person hand held assist Gait Pattern/deviations: Step-through pattern   Gait velocity interpretation: <1.31 ft/sec, indicative of household ambulator   General Gait Details: mild unsteadiness yet no LOB- Patient reports usually wearing shoes - inital staggered but improved with distance.  Stairs            Wheelchair Mobility    Modified Rankin (Stroke Patients Only)       Balance Overall balance assessment: Mild deficits observed, not formally tested                                           Pertinent Vitals/Pain Pain Assessment Pain Assessment: No/denies pain    Home Living Family/patient expects to be discharged to:: Private residence Living Arrangements: Children (Adult son- Cabin crew) Available Help at Discharge: Family Type of Home: House Home Access: Stairs to enter Entrance Stairs-Rails: None (from carport) Technical brewer of Steps: 2   Home Layout: One level Home Equipment: Rollator (4 wheels)      Prior Function Prior  Level of Function : Independent/Modified Independent             Mobility Comments: occasional use of rollator at night or early morning with ambulation- otherwise no AD ADLs Comments: Patient reports independent with all ADL's- Cooking cleaning as well.     Hand Dominance   Dominant Hand: Right    Extremity/Trunk Assessment   Upper Extremity Assessment Upper Extremity Assessment: Overall WFL  for tasks assessed    Lower Extremity Assessment Lower Extremity Assessment: Overall WFL for tasks assessed    Cervical / Trunk Assessment Cervical / Trunk Assessment: Kyphotic  Communication   Communication: No difficulties  Cognition Arousal/Alertness: Awake/alert Behavior During Therapy: WFL for tasks assessed/performed Overall Cognitive Status: Within Functional Limits for tasks assessed                                 General Comments: Alert to name, location, DOB, Situation        General Comments General comments (skin integrity, edema, etc.): No significant edema, no evidence of any skin integrity issues    Exercises     Assessment/Plan    PT Assessment Patient needs continued PT services  PT Problem List Decreased mobility;Decreased balance;Decreased coordination       PT Treatment Interventions Gait training;Functional mobility training;Patient/family education (Educated in importance of ambulation to remain mobile; elevation of LE and pumping ankle if any edema in LE's.)    PT Goals (Current goals can be found in the Care Plan section)  Acute Rehab PT Goals Patient Stated Goal: I want to go home without any Oxygen PT Goal Formulation: With patient Time For Goal Achievement: 05/19/22 Potential to Achieve Goals: Good    Frequency Min 2X/week     Co-evaluation               AM-PAC PT "6 Clicks" Mobility  Outcome Measure Help needed turning from your back to your side while in a flat bed without using bedrails?: None Help needed moving from lying on your back to sitting on the side of a flat bed without using bedrails?: None Help needed moving to and from a bed to a chair (including a wheelchair)?: None Help needed standing up from a chair using your arms (e.g., wheelchair or bedside chair)?: None Help needed to walk in hospital room?: A Little Help needed climbing 3-5 steps with a railing? : A Little 6 Click Score: 22    End of  Session Equipment Utilized During Treatment: Gait belt Activity Tolerance: Patient tolerated treatment well Patient left: in chair;with call bell/phone within reach;with family/visitor present Nurse Communication: Mobility status PT Visit Diagnosis: Unsteadiness on feet (R26.81);Difficulty in walking, not elsewhere classified (R26.2);Other abnormalities of gait and mobility (R26.89)    Time: 2330-0762 PT Time Calculation (min) (ACUTE ONLY): 20 min   Charges:   PT Evaluation $PT Eval Low Complexity: 1 Low PT Treatments $Therapeutic Activity: 8-22 mins          Ollen Bowl, PT 05/05/2022, 9:38 AM

## 2022-05-05 NOTE — Discharge Summary (Signed)
Heidi Baldwin KZS:[id number] DOB: 18-Nov-1923 DOA: 05/03/2022  PCP: Einar Pheasant, MD  Admit date: 05/03/2022 Discharge date: 05/05/2022  Time spent: 35 minutes  Recommendations for Outpatient Follow-up:  Cardiology f/u 1-2 weeks, consider pacemaker interrogation BMP at f/u  F/u pleural fluid studies    Discharge Diagnoses:  Principal Problem:   CHF exacerbation ([place]) Active Problems:   Atrial fibrillation ([place])   Essential hypertension, benign   Hypothyroidism   Artificial cardiac pacemaker   Pulmonary hypertension ([place])   Hypoxia   Hyponatremia   Discharge Condition: stable  Diet recommendation: heart healthy  Filed Weights   05/03/22 1417 05/04/22 2217  Weight: 66.7 kg 64.2 kg    History of present illness:  From admission h and p Heidi Baldwin is a 88 y.o. female with medical history significant for a fib, htn, sick sinus s/p pacemaker, moderate mitral and tricuspid regurg, phtn, hypothyroid, htn, ckd 3b, who presents with the above.   Reports several days intermittent dyspnea. No fever or cough or chest pain. No syncope or presyncope. Does have some swelling. Is worse with lying down.    Per EDP patient was hypoxic to 89 on room air in the ED and dyspneic with ambulation.   She reports her pacemaker was interrogated last week, hasn't received report  Hospital Course:  Patient presented with exacerbation of heart failure. Initially hypoxic, 89% in the ED, which resolved with diuresis and thoracentesis. Ambulated with PT without difficulty. For CHF she was treated with IV diuresis with furosemide. For pleural effusion was treated with thoracentesis consistent with transudate. Unable to interrogate pacemaker (sick sinus syndrome), consider doing this at close cardiology f/u. Chronic conditions stable. In terms of home regimen, we will add low dose furosemide and hold home amlodipine for the time being.   Procedures: thoracentesis   Consultations: none  Discharge  Exam: Vitals:   05/05/22 0748 05/05/22 0922  BP: (!) 129/58   Pulse: 65 68  Resp: 18 18  Temp: 98.1 F (36.7 C)   SpO2: 100% 98%    Constitutional: No acute distress Head: Atraumatic Eyes: Conjunctiva clear ENM: Moist mucous membranes. Normal dentition.  Neck: Supple Respiratory: Clear to auscultation bilaterally, no wheezing/rales/rhonchi. Normal respiratory effort. Decreased breath sounds in bases Cardiovascular: Regular rate and rhythm. Soft systolic murmur Abdomen: Non-tender, non-distended. No masses. No rebound or guarding. Positive bowel sounds. Musculoskeletal: No joint deformity upper and lower extremities. Normal ROM, no contractures. Normal muscle tone.  Skin: No rashes, lesions, or ulcers.  Extremities: moderate LE edema Neurologic: Alert, moving all 4 extremities. Psychiatric: Normal insight and judgement.  Discharge Instructions   Discharge Instructions     Diet - low sodium heart healthy   Complete by: As directed    Increase activity slowly   Complete by: As directed    No wound care   Complete by: As directed       Allergies as of 05/05/2022       Reactions   Statins Other (See Comments)   weakness   Sulfa Antibiotics    Caused increased intraocular pressure        Medication List     STOP taking these medications    amLODipine 5 MG tablet Commonly known as: NORVASC       TAKE these medications    carvedilol 3.125 MG tablet Commonly known as: COREG Take 1 tablet by mouth in the morning and at bedtime.   Eliquis 5 MG Tabs tablet Generic drug: apixaban Take 5 mg  by mouth 2 (two) times daily.   furosemide 20 MG tablet Commonly known as: Lasix Take 1 tablet (20 mg total) by mouth daily.   levothyroxine 50 MCG tablet Commonly known as: SYNTHROID TAKE 1 TABLET BY MOUTH DAILY 30 TO 60 MINUTES BEFORE BREAKFAST ON AN EMPTY STOMACH AND WITH GLASS OF WATER What changed: See the new instructions.   losartan 50 MG tablet Commonly  known as: COZAAR Take 1 tablet (50 mg total) by mouth daily.   pantoprazole 40 MG tablet Commonly known as: PROTONIX TAKE 1 TABLET(40 MG) BY MOUTH TWICE DAILY   spironolactone 25 MG tablet Commonly known as: ALDACTONE TAKE 1/2 TABLET(12.5 MG) BY MOUTH DAILY   traMADol 50 MG tablet Commonly known as: ULTRAM TAKE 1 TABLET BY MOUTH 2 TO 3 TIMES DAILY AS NEEDED       Allergies  Allergen Reactions   Statins Other (See Comments)    weakness   Sulfa Antibiotics     Caused increased intraocular pressure    Follow-up Information     Corey Skains, MD. Schedule an appointment as soon as possible for a visit.   Specialty: Cardiology Contact information: [facility] [place] [address] [phone]         Einar Pheasant, MD Follow up.   Specialty: Internal Medicine Contact information: [address] [place] [address] [phone]                  The results of significant diagnostics from this hospitalization (including imaging, microbiology, ancillary and laboratory) are listed below for reference.    Significant Diagnostic Studies: ECHOCARDIOGRAM COMPLETE  Result Date: 05/04/2022    ECHOCARDIOGRAM REPORT   Patient Name:   Heidi Baldwin Date of Exam: 05/04/2022 Medical Rec #:  [id number]    Height:       64.0 in Accession #:    [id number]   Weight:       147.0 lb Date of Birth:  29-Nov-1923     BSA:          1.716 m Patient Age:    88 years     BP:           120/55 mmHg Patient Gender: F            HR:           62 bpm. Exam Location:  [facility] Procedure: 2D Echo Indications:    Dyspnea  History:        Patient has no prior history of Echocardiogram examinations.                 Pacemaker, Arrythmias:Atrial Fibrillation and Bradycardia; Risk                 Factors:Hypertension.  Sonographer:    Harvie Junior Referring Phys: Hubbard Cayey  1. Left ventricular ejection  fraction, by estimation, is 45 to 50%. The left ventricle has mildly decreased function. The left ventricle demonstrates global hypokinesis. Left ventricular diastolic parameters are consistent with Grade III diastolic dysfunction (restrictive).  2. Right ventricular systolic function is normal. The right ventricular size is mildly enlarged. There is normal pulmonary artery systolic pressure.  3. Left atrial size was mildly dilated.  4. Right atrial size was mildly dilated.  5. The mitral valve is normal in structure. Mild mitral valve regurgitation.  6. The aortic valve is calcified. Aortic valve regurgitation is trivial. FINDINGS  Left Ventricle: Left  ventricular ejection fraction, by estimation, is 45 to 50%. The left ventricle has mildly decreased function. The left ventricle demonstrates global hypokinesis. The left ventricular internal cavity size was normal in size. There is  borderline concentric left ventricular hypertrophy. Left ventricular diastolic parameters are consistent with Grade III diastolic dysfunction (restrictive). Right Ventricle: The right ventricular size is mildly enlarged. No increase in right ventricular wall thickness. Right ventricular systolic function is normal. There is normal pulmonary artery systolic pressure. The tricuspid regurgitant velocity is 2.57  m/s, and with an assumed right atrial pressure of 3 mmHg, the estimated right ventricular systolic pressure is 37.9 mmHg. Left Atrium: Left atrial size was mildly dilated. Right Atrium: Right atrial size was mildly dilated. Pericardium: There is no evidence of pericardial effusion. Mitral Valve: The mitral valve is normal in structure. Mild mitral valve regurgitation. MV peak gradient, 7.8 mmHg. The mean mitral valve gradient is 3.0 mmHg. Tricuspid Valve: The tricuspid valve is normal in structure. Tricuspid valve regurgitation is mild. Aortic Valve: The aortic valve is calcified. Aortic valve regurgitation is trivial. Aortic valve  mean gradient measures 5.5 mmHg. Aortic valve peak gradient measures 11.2 mmHg. Aortic valve area, by VTI measures 2.32 cm. Pulmonic Valve: The pulmonic valve was grossly normal. Pulmonic valve regurgitation is mild. No evidence of pulmonic stenosis. Aorta: The ascending aorta was not well visualized. IAS/Shunts: No atrial level shunt detected by color flow Doppler. Additional Comments: A device lead is visualized. There is a small pleural effusion in the left lateral region.  LEFT VENTRICLE PLAX 2D LVIDd:         3.40 cm     Diastology LVIDs:         2.70 cm     LV e' medial:    9.46 cm/s LV PW:         0.90 cm     LV E/e' medial:  14.2 LV IVS:        1.20 cm     LV e' lateral:   10.30 cm/s LVOT diam:     1.80 cm     LV E/e' lateral: 13.0 LV SV:         76 LV SV Index:   44 LVOT Area:     2.54 cm  LV Volumes (MOD) LV vol d, MOD A2C: 53.4 ml LV vol d, MOD A4C: 48.0 ml LV vol s, MOD A2C: 20.2 ml LV vol s, MOD A4C: 21.2 ml LV SV MOD A2C:     33.2 ml LV SV MOD A4C:     48.0 ml LV SV MOD BP:      30.9 ml RIGHT VENTRICLE RV Basal diam:  4.00 cm RV Mid diam:    4.00 cm RV S prime:     12.60 cm/s TAPSE (M-mode): 1.0 cm LEFT ATRIUM              Index        RIGHT ATRIUM           Index LA diam:        4.20 cm  2.45 cm/m   RA Area:     18.50 cm LA Vol (A2C):   103.0 ml 60.01 ml/m  RA Volume:   46.10 ml  26.86 ml/m LA Vol (A4C):   87.4 ml  50.92 ml/m LA Biplane Vol: 95.0 ml  55.35 ml/m  AORTIC VALVE  PULMONIC VALVE AV Area (Vmax):    2.43 cm      PV Vmax:          1.06 m/s AV Area (Vmean):   2.36 cm      PV Peak grad:     4.5 mmHg AV Area (VTI):     2.32 cm      PR End Diast Vel: 6.45 msec AV Vmax:           167.50 cm/s AV Vmean:          110.000 cm/s AV VTI:            0.328 m AV Peak Grad:      11.2 mmHg AV Mean Grad:      5.5 mmHg LVOT Vmax:         160.00 cm/s LVOT Vmean:        102.000 cm/s LVOT VTI:          0.299 m LVOT/AV VTI ratio: 0.91  AORTA Ao Root diam: 3.10 cm MITRAL VALVE                 TRICUSPID VALVE MV Area (PHT): 2.39 cm     TR Peak grad:   26.4 mmHg MV Area VTI:   1.90 cm     TR Vmax:        257.00 cm/s MV Peak grad:  7.8 mmHg MV Mean grad:  3.0 mmHg     SHUNTS MV Vmax:       1.40 m/s     Systemic VTI:  0.30 m MV Vmean:      73.1 cm/s    Systemic Diam: 1.80 cm MV Decel Time: 317 msec MR Peak grad: 48.3 mmHg MR Mean grad: 41.0 mmHg MR Vmax:      347.67 cm/s MR Vmean:     303.0 cm/s MV E velocity: 134.00 cm/s MV A velocity: 31.60 cm/s MV E/A ratio:  4.24 Dwayne D Callwood MD Electronically signed by Yolonda Kida MD Signature Date/Time: 05/04/2022/4:42:32 PM    Final    US THORACENTESIS ASP PLEURAL SPACE W/IMG GUIDE  Result Date: 05/04/2022 INDICATION: Right pleural effusion EXAM: ULTRASOUND GUIDED RIGHT THORACENTESIS MEDICATIONS: 6 cc 1% lidocaine COMPLICATIONS: None immediate.  No pneumothorax on follow-up radiograph. PROCEDURE: An ultrasound guided thoracentesis was thoroughly discussed with the patient and questions answered. The benefits, risks, alternatives and complications were also discussed. The patient understands and wishes to proceed with the procedure. Written consent was obtained. Ultrasound was performed to localize and mark an adequate pocket of fluid in the right chest. The area was then prepped and draped in the normal sterile fashion. 1% Lidocaine was used for local anesthesia. Under ultrasound guidance a 6 Fr Safe-T-Centesis catheter was introduced. Thoracentesis was performed. The catheter was removed and a dressing applied. FINDINGS: A total of approximately 1.2 L of clear yellow fluid was removed. Samples were sent to the laboratory as requested by the clinical team. IMPRESSION: Successful ultrasound guided right thoracentesis yielding 1.2 L of pleural fluid. Read by: Reatha Armour, PA-C Electronically Signed   By: Lucrezia Europe M.D.   On: 05/04/2022 14:58   DG Chest Port 1 View  Result Date: 05/04/2022 CLINICAL DATA:  Thoracentesis. EXAM: PORTABLE CHEST  1 VIEW COMPARISON:  Chest x-ray October 12, 23. FINDINGS: Bilateral pleural effusions, decreased in size on the right. Probable overlying atelectasis. No visible pneumothorax. Unchanged cardiomediastinal silhouette. Left subclavian approach cardiac rhythm maintenance device. IMPRESSION: Bilateral pleural effusions, decreased in  size on the right. Probable overlying atelectasis. No visible pneumothorax. Electronically Signed   By: Margaretha Sheffield M.D.   On: 05/04/2022 12:21   CT CHEST WO CONTRAST  Result Date: 05/03/2022 CLINICAL DATA:  Pleural effusion, malignancy suspected EXAM: CT CHEST WITHOUT CONTRAST TECHNIQUE: Multidetector CT imaging of the chest was performed following the standard protocol without IV contrast. RADIATION DOSE REDUCTION: This exam was performed according to the departmental dose-optimization program which includes automated exposure control, adjustment of the mA and/or kV according to patient size and/or use of iterative reconstruction technique. COMPARISON:  CT abdomen and pelvis 03/04/2015 FINDINGS: Cardiovascular: Cardiac enlargement. Calcification in the mitral valve annulus. Coronary artery and aortic calcification. No aortic aneurysm. Mediastinum/Nodes: Esophagus is decompressed. Thyroid gland is unremarkable. Mediastinal lymph nodes are not pathologically enlarged, likely reactive. Cardiac pacemaker. Lungs/Pleura: Large bilateral pleural effusions with bilateral basilar infiltrates. This likely represents compressive atelectasis but could indicate multifocal pneumonia in the appropriate clinical setting. Bronchial wall thickening consistent with chronic bronchitis. No pneumothorax. Upper Abdomen: Large heterogeneous mass in the upper pole of the right kidney measuring 7.5 x 8.6 cm. This is enlarging since the previous study and likely represents renal cell carcinoma. Characterization is limited on noncontrast imaging. The mass is incompletely included within the field of view.  Surgical absence of the gallbladder. Musculoskeletal: Degenerative changes.  No focal bone lesions. IMPRESSION: 1. Large bilateral pleural effusions with basilar atelectasis or consolidation. 2. 8.6 cm diameter solid mass in the upper pole of the right kidney is enlarging since prior study and likely represents renal cell carcinoma. 3. Cardiac enlargement. Calcification of the mitral valve annulus. Aortic atherosclerosis. Electronically Signed   By: Lucienne Capers M.D.   On: 05/03/2022 22:05   DG Chest 2 View  Result Date: 05/03/2022 CLINICAL DATA:  Shortness of breath EXAM: CHEST - 2 VIEW COMPARISON:  Chest radiograph dated 04/02/2013. FINDINGS: The heart is borderline enlarged. Vascular calcifications are seen in the aortic arch. There is a small to moderate right pleural effusion with associated atelectasis/airspace disease. The left lung is clear with no pleural effusion. There is no pneumothorax. A left subclavian approach cardiac device is redemonstrated. Degenerative changes are seen in the spine. IMPRESSION: Small to moderate right pleural effusion with associated atelectasis/airspace disease. Aortic Atherosclerosis (ICD10-I70.0). Electronically Signed   By: Zerita Boers M.D.   On: 05/03/2022 12:56    Microbiology: Recent Results (from the past 240 hour(s))  SARS Coronavirus 2 by RT PCR (hospital order, performed in [facility] hospital lab) *cepheid single result test* Anterior Nasal Swab     Status: None   Collection Time: 05/03/22  5:01 PM   Specimen: Anterior Nasal Swab  Result Value Ref Range Status   SARS Coronavirus 2 by RT PCR NEGATIVE NEGATIVE Final    Comment: (NOTE) SARS-CoV-2 target nucleic acids are NOT DETECTED.  The SARS-CoV-2 RNA is generally detectable in upper and lower respiratory specimens during the acute phase of infection. The lowest concentration of SARS-CoV-2 viral copies this assay can detect is 250 copies / mL. A negative result does not preclude SARS-CoV-2  infection and should not be used as the sole basis for treatment or other patient management decisions.  A negative result may occur with improper specimen collection / handling, submission of specimen other than nasopharyngeal swab, presence of viral mutation(s) within the areas targeted by this assay, and inadequate number of viral copies (<250 copies / mL). A negative result must be combined with clinical observations, patient history, and epidemiological  information.  Fact Sheet for Patients:   https://www.patel.info/  Fact Sheet for Healthcare Providers: https://hall.com/  This test is not yet approved or  cleared by the [place] FDA and has been authorized for detection and/or diagnosis of SARS-CoV-2 by FDA under an Emergency Use Authorization (EUA).  This EUA will remain in effect (meaning this test can be used) for the duration of the COVID-19 declaration under Section 564(b)(1) of the Act, 21 U.S.C. section 360bbb-3(b)(1), unless the authorization is terminated or revoked sooner.  Performed at [facility], [address] [place], [place] [address]      Labs: Basic Metabolic Panel: Recent Labs  Lab 05/03/22 1425 05/04/22 0535 05/05/22 0448  NA 128* 132* 133*  K 4.5 3.9 3.5  CL 92* 92* 91*  CO2 '28 29 26  '$ GLUCOSE 114* 94 59*  BUN '18 15 23  '$ CREATININE 0.86 0.87 1.11*  CALCIUM 9.5 9.4 9.1  MG  --   --  1.8  PHOS  --   --  4.8*   Liver Function Tests: Recent Labs  Lab 05/03/22 1425 05/04/22 0535  AST 24  --   ALT 15  --   ALKPHOS 62  --   BILITOT 1.6*  --   PROT 7.9 6.9  ALBUMIN 4.5  --    No results for input(s): "LIPASE", "AMYLASE" in the last 168 hours. No results for input(s): "AMMONIA" in the last 168 hours. CBC: Recent Labs  Lab 05/03/22 1425 05/04/22 0535  WBC 8.7 9.6  NEUTROABS 7.1  --   HGB 13.6 13.1  HCT 40.2 39.2  MCV 85.5 85.2  PLT 226 226   Cardiac Enzymes: No results  for input(s): "CKTOTAL", "CKMB", "CKMBINDEX", "TROPONINI" in the last 168 hours. BNP: BNP (last 3 results) Recent Labs    05/03/22 1529  BNP 512.2*    ProBNP (last 3 results) No results for input(s): "PROBNP" in the last 8760 hours.  CBG: No results for input(s): "GLUCAP" in the last 168 hours.     Signed:  Desma Maxim MD.  [facility] 05/05/2022, 10:22 AM

## 2022-05-07 ENCOUNTER — Telehealth: Payer: Self-pay

## 2022-05-07 LAB — CYTOLOGY - NON PAP

## 2022-05-07 NOTE — Telephone Encounter (Addendum)
Transition Care Management Unsuccessful Follow-up Telephone Call  Date of discharge and from where:  05/05/22 [facility]  Attempts:  1st Attempt  Reason for unsuccessful TCM follow-up call:  Unable to reach patient. Hospital follow up scheduled 05/18/22 @ 1:00. Keep all scheduled appointments. Will follow.   Kennice Finnie Motley LPN, [place], [place] Direct dial ??[phone]

## 2022-05-08 NOTE — Telephone Encounter (Signed)
Transition Care Management Unsuccessful Follow-up Telephone Call  Date of discharge and from where:  05/05/22 [facility]  Attempts:  2nd Attempt  Reason for unsuccessful TCM follow-up call:  Left voice message. Hospital follow up scheduled 05/18/22 @ 1:00. Keep all scheduled appointments. Will follow.    Kalieb Freeland Motley LPN, [place], [place] Direct dial ??[phone]

## 2022-05-09 LAB — BODY FLUID CULTURE W GRAM STAIN
Culture: NO GROWTH
Gram Stain: NONE SEEN

## 2022-05-09 LAB — CHOLESTEROL, BODY FLUID: Cholesterol, Fluid: 21 mg/dL

## 2022-05-09 NOTE — Telephone Encounter (Signed)
Transition Care Management Follow-up Telephone Call Date of discharge and from where: 05/05/22 [facility] How have you been since you were released from the hospital? Information received from son, HIPAA compliant. Patient is doing pretty good. Maintaining weight. Weighing daily. Appetite is decent. Walking better than prior to going to the hospital. Gilford Rile not needed as much but is in use. Baseline. BM daily, appropriate. Drinking plenty of fluids. Any questions or concerns? No  Items Reviewed: Did the pt receive and understand the discharge instructions provided? Yes  Medications obtained and verified? Yes  Any new allergies since your discharge? No  Dietary orders reviewed? Yes, heart healthy. Do you have support at home? Yes   Home Care and Equipment/Supplies: Were home health services ordered? No  Functional Questionnaire: (I = Independent and D = Dependent) ADLs: Family assist as needed.  Eating- I  Maintaining continence- I  Transferring/Ambulation- [place]  Managing Meds- Family assist  Follow up appointments reviewed:  PCP Hospital f/u appt confirmed? Yes  Scheduled to see PCP on 05/18/22. Unavailable to come sooner. [place] qualification.  [facility] f/u appt confirmed? Yes  Scheduled to see Cardiology on 04/15/22.  Are transportation arrangements needed? No  If their condition worsens, is the pt aware to call PCP or go to the Emergency Dept.? Yes Was the patient provided with contact information for the PCP's office or ED? Yes Was to pt encouraged to call back with questions or concerns? Yes   [place] Dominique Motley LPN, [place], [place] Direct dial ??[phone]

## 2022-05-11 ENCOUNTER — Ambulatory Visit: Payer: Medicare HMO | Attending: Family | Admitting: Family

## 2022-05-11 ENCOUNTER — Encounter: Payer: Self-pay | Admitting: Family

## 2022-05-11 VITALS — BP 158/77 | HR 86 | Resp 16 | Ht 65.0 in | Wt 145.1 lb

## 2022-05-11 DIAGNOSIS — I5022 Chronic systolic (congestive) heart failure: Secondary | ICD-10-CM | POA: Insufficient documentation

## 2022-05-11 DIAGNOSIS — I10 Essential (primary) hypertension: Secondary | ICD-10-CM | POA: Diagnosis not present

## 2022-05-11 DIAGNOSIS — R5383 Other fatigue: Secondary | ICD-10-CM | POA: Diagnosis not present

## 2022-05-11 DIAGNOSIS — E079 Disorder of thyroid, unspecified: Secondary | ICD-10-CM | POA: Diagnosis not present

## 2022-05-11 DIAGNOSIS — I272 Pulmonary hypertension, unspecified: Secondary | ICD-10-CM | POA: Insufficient documentation

## 2022-05-11 DIAGNOSIS — I480 Paroxysmal atrial fibrillation: Secondary | ICD-10-CM | POA: Diagnosis not present

## 2022-05-11 DIAGNOSIS — R0602 Shortness of breath: Secondary | ICD-10-CM | POA: Insufficient documentation

## 2022-05-11 DIAGNOSIS — Z7901 Long term (current) use of anticoagulants: Secondary | ICD-10-CM | POA: Insufficient documentation

## 2022-05-11 DIAGNOSIS — I130 Hypertensive heart and chronic kidney disease with heart failure and stage 1 through stage 4 chronic kidney disease, or unspecified chronic kidney disease: Secondary | ICD-10-CM | POA: Diagnosis not present

## 2022-05-11 DIAGNOSIS — M25569 Pain in unspecified knee: Secondary | ICD-10-CM | POA: Insufficient documentation

## 2022-05-11 DIAGNOSIS — Z79899 Other long term (current) drug therapy: Secondary | ICD-10-CM | POA: Diagnosis not present

## 2022-05-11 DIAGNOSIS — Z950 Presence of cardiac pacemaker: Secondary | ICD-10-CM | POA: Insufficient documentation

## 2022-05-11 DIAGNOSIS — E785 Hyperlipidemia, unspecified: Secondary | ICD-10-CM | POA: Diagnosis not present

## 2022-05-11 NOTE — Progress Notes (Signed)
Patient ID: Heidi Baldwin, female    DOB: [birth year], 88 y.o.   MRN: [id number]  HPI  Ms Wyche is a 88 y/o female with a history of hyperlipidemia, HTN, thyroid disease, pulmonary HTN, hyponatremia, atrial fibrillation and chronic heart failure.   Echo report from 05/04/22 reviewed and showed an EF of 45-50% along with mild LAE and mild MR.   Admitted 05/03/22 due to HF exacerbation. Initially hypoxic. Initially given IV lasix with transition to oral diuretics. Thoracentesis done for pleural effusion. Low dose lasix started and amlodipine held. Discharged after 2 days.   She presents today for her initial visit with a chief complaint of minimal fatigue upon moderate exertion. Describes this as chronic in nature. She has associated shortness of breath, knee pain and chronic difficulty sleeping along with this. She denies any dizziness, abdominal distention, palpitations, pedal edema, chest pain, wheezing, cough or weight gain.   Remains quite active and still does all her housework.   Past Medical History:  Diagnosis Date   Acid reflux    Allergy    Arthritis    Atrial fibrillation (HCC)    CHF (congestive heart failure) (HCC)    Chronic kidney disease    Heart disease    Heart murmur    Hx of actinic keratosis 11/20/2013   under left breast   Hypercholesterolemia    Hypertension    Hyponatremia    Hypothyroidism    Pulmonary HTN (HCC)    Past Surgical History:  Procedure Laterality Date   ABDOMINAL HYSTERECTOMY     CHOLECYSTECTOMY     CORNEAL TRANSPLANT     Right x 3 Left x1   CYST REMOVAL TRUNK     Tail bone   KNEE SURGERY Left    PACEMAKER INSERTION  05/03/13   Family History  Problem Relation Age of Onset   Diabetes Mother    Heart disease Father    Hyperlipidemia Father    Heart disease Sister    Arthritis Son    Social History   Tobacco Use   Smoking status: Never   Smokeless tobacco: Never  Substance Use Topics   Alcohol use: No    Alcohol/week: 0.0  standard drinks of alcohol   Allergies  Allergen Reactions   Statins Other (See Comments)    weakness   Sulfa Antibiotics     Caused increased intraocular pressure   Prior to Admission medications   Medication Sig Start Date End Date Taking? Authorizing Provider  apixaban (ELIQUIS) 5 MG TABS tablet Take 5 mg by mouth 2 (two) times daily.    Yes [provider]  carvedilol (COREG) 3.125 MG tablet Take 1 tablet by mouth in the morning and at bedtime. 11/05/19  Yes [provider]  furosemide (LASIX) 20 MG tablet Take 1 tablet (20 mg total) by mouth daily. 05/05/22 05/05/23 Yes Wouk, Ailene Rud, MD  levothyroxine (SYNTHROID) 50 MCG tablet TAKE 1 TABLET BY MOUTH DAILY 30 TO 60 MINUTES BEFORE BREAKFAST ON AN EMPTY STOMACH AND WITH GLASS OF WATER Patient taking differently: Take 50 mcg by mouth daily before breakfast. 08/08/21  Yes Einar Pheasant, MD  losartan (COZAAR) 50 MG tablet Take 1 tablet (50 mg total) by mouth daily. 11/29/21  Yes Einar Pheasant, MD  pantoprazole (PROTONIX) 40 MG tablet TAKE 1 TABLET(40 MG) BY MOUTH TWICE DAILY 05/19/21  Yes Einar Pheasant, MD  spironolactone (ALDACTONE) 25 MG tablet TAKE 1/2 TABLET(12.5 MG) BY MOUTH DAILY 04/30/22  Yes Einar Pheasant, MD  traMADol (ULTRAM) 50 MG tablet TAKE 1 TABLET BY MOUTH 2 TO 3 TIMES DAILY AS NEEDED 04/23/22  Yes Einar Pheasant, MD   Review of Systems  Constitutional:  Positive for fatigue. Negative for appetite change.  HENT:  Positive for hearing loss. Negative for congestion, postnasal drip and sore throat.   Eyes: Negative.   Respiratory:  Positive for shortness of breath (sometimes at night). Negative for cough, chest tightness and wheezing.   Cardiovascular:  Negative for chest pain, palpitations and leg swelling.  Gastrointestinal:  Negative for abdominal distention and abdominal pain.  Endocrine: Negative.   Genitourinary: Negative.   Musculoskeletal:  Positive for arthralgias (knee pain). Negative for  neck pain.  Skin: Negative.   Allergic/Immunologic: Negative.   Neurological:  Negative for dizziness and light-headedness.  Hematological:  Negative for adenopathy. Does not bruise/bleed easily.  Psychiatric/Behavioral:  Positive for sleep disturbance (sleeping on 1 pillow). Negative for dysphoric mood. The patient is not nervous/anxious.    Vitals:   05/11/22 1246  BP: (!) 158/77  Pulse: 86  Resp: 16  SpO2: 99%  Weight: 145 lb 2 oz (65.8 kg)  Height: '5\' 5"'$  (1.651 m)   Wt Readings from Last 3 Encounters:  05/11/22 145 lb 2 oz (65.8 kg)  05/04/22 141 lb 8.6 oz (64.2 kg)  05/03/22 145 lb (65.8 kg)   Lab Results  Component Value Date   CREATININE 1.11 (H) 05/05/2022   CREATININE 0.87 05/04/2022   CREATININE 0.86 05/03/2022   Physical Exam Vitals and nursing note reviewed.  Constitutional:      Appearance: Normal appearance.  HENT:     Head: Normocephalic and atraumatic.     Right Ear: Decreased hearing noted.     Left Ear: Decreased hearing noted.  Cardiovascular:     Rate and Rhythm: Normal rate and regular rhythm.  Pulmonary:     Effort: Pulmonary effort is normal. No respiratory distress.     Breath sounds: No wheezing or rales.  Abdominal:     General: There is no distension.     Palpations: Abdomen is soft.  Musculoskeletal:        General: No tenderness.     Cervical back: Normal range of motion and neck supple.     Right lower leg: Edema (trace pitting) present.     Left lower leg: Edema (trace pitting) present.  Skin:    General: Skin is warm and dry.  Neurological:     General: No focal deficit present.     Mental Status: She is alert and oriented to person, place, and time.  Psychiatric:        Mood and Affect: Mood normal.        Behavior: Behavior normal.        Thought Content: Thought content normal.   Assessment & Plan:  1: Chronic heart failure with mildly reduced ejection fraction- - NYHA class II - euvolemic today - weighing daily;  reminded to call for an overnight weight gain of > 2 pounds or a weekly weight gain of > 5 pounds - not adding salt  - on GDMT of carvedilol, losartan and spironolactone - consider adding SGLT2 at next visit - remains quite active at home - BNP 05/03/22 was 512.2 - has received her flu vaccine for this season  2: HTN with CKD- - BP mildly elevated (158/77) - saw PCP Nicki Reaper) 08/24/21; returns 05/18/22 - BMP 05/05/22 reviewed and showed sodium 133, potassium 3.5, creatinine 1.11 and GFR 45  3:  Atrial fibrillation- - saw cardiology Albertine Patricia) 02/02/22; returns 05/15/22 - currently taking apixaban - currently has a pacemaker   Patient did not bring her medications nor a list. Each medication was verbally reviewed with the patient and she was encouraged to bring the bottles to every visit to confirm accuracy of list.   Return in 2 months, sooner if needed

## 2022-05-11 NOTE — Patient Instructions (Addendum)
Continue weighing daily and call for an overnight weight gain of 3 pounds or more or a weekly weight gain of more than 5 pounds. ? ? ?If you have voicemail, please make sure your mailbox is cleaned out so that we may leave a message and please make sure to listen to any voicemails.  ? ? ?Bring ALL medications including any over the counter or vitamins to every visit.  ? ? ?

## 2022-05-15 ENCOUNTER — Ambulatory Visit: Payer: Medicare HMO | Admitting: Internal Medicine

## 2022-05-15 DIAGNOSIS — I482 Chronic atrial fibrillation, unspecified: Secondary | ICD-10-CM | POA: Diagnosis not present

## 2022-05-15 DIAGNOSIS — I10 Essential (primary) hypertension: Secondary | ICD-10-CM | POA: Diagnosis not present

## 2022-05-15 DIAGNOSIS — I5021 Acute systolic (congestive) heart failure: Secondary | ICD-10-CM | POA: Diagnosis not present

## 2022-05-15 DIAGNOSIS — I517 Cardiomegaly: Secondary | ICD-10-CM | POA: Diagnosis not present

## 2022-05-15 DIAGNOSIS — I2510 Atherosclerotic heart disease of native coronary artery without angina pectoris: Secondary | ICD-10-CM | POA: Diagnosis not present

## 2022-05-18 ENCOUNTER — Encounter: Payer: Self-pay | Admitting: Internal Medicine

## 2022-05-18 ENCOUNTER — Ambulatory Visit (INDEPENDENT_AMBULATORY_CARE_PROVIDER_SITE_OTHER): Payer: Medicare HMO | Admitting: Internal Medicine

## 2022-05-18 VITALS — BP 146/82 | HR 83 | Temp 98.2°F | Ht 65.0 in | Wt 144.2 lb

## 2022-05-18 DIAGNOSIS — I272 Pulmonary hypertension, unspecified: Secondary | ICD-10-CM | POA: Diagnosis not present

## 2022-05-18 DIAGNOSIS — N1831 Chronic kidney disease, stage 3a: Secondary | ICD-10-CM

## 2022-05-18 DIAGNOSIS — E038 Other specified hypothyroidism: Secondary | ICD-10-CM

## 2022-05-18 DIAGNOSIS — K219 Gastro-esophageal reflux disease without esophagitis: Secondary | ICD-10-CM | POA: Diagnosis not present

## 2022-05-18 DIAGNOSIS — C641 Malignant neoplasm of right kidney, except renal pelvis: Secondary | ICD-10-CM | POA: Diagnosis not present

## 2022-05-18 DIAGNOSIS — I10 Essential (primary) hypertension: Secondary | ICD-10-CM | POA: Diagnosis not present

## 2022-05-18 DIAGNOSIS — I495 Sick sinus syndrome: Secondary | ICD-10-CM | POA: Diagnosis not present

## 2022-05-18 DIAGNOSIS — Z23 Encounter for immunization: Secondary | ICD-10-CM

## 2022-05-18 DIAGNOSIS — E7800 Pure hypercholesterolemia, unspecified: Secondary | ICD-10-CM | POA: Diagnosis not present

## 2022-05-18 DIAGNOSIS — Z8679 Personal history of other diseases of the circulatory system: Secondary | ICD-10-CM

## 2022-05-18 DIAGNOSIS — I480 Paroxysmal atrial fibrillation: Secondary | ICD-10-CM | POA: Diagnosis not present

## 2022-05-18 DIAGNOSIS — D6869 Other thrombophilia: Secondary | ICD-10-CM

## 2022-05-18 DIAGNOSIS — W19XXXA Unspecified fall, initial encounter: Secondary | ICD-10-CM

## 2022-05-18 LAB — BASIC METABOLIC PANEL
BUN: 18 mg/dL (ref 6–23)
CO2: 33 mEq/L — ABNORMAL HIGH (ref 19–32)
Calcium: 9.3 mg/dL (ref 8.4–10.5)
Chloride: 91 mEq/L — ABNORMAL LOW (ref 96–112)
Creatinine, Ser: 1.08 mg/dL (ref 0.40–1.20)
GFR: 43.32 mL/min — ABNORMAL LOW (ref >60.00)
Glucose, Bld: 93 mg/dL (ref 70–99)
Potassium: 3.8 mEq/L (ref 3.5–5.1)
Sodium: 132 mEq/L — ABNORMAL LOW (ref 135–145)

## 2022-05-18 LAB — HEPATIC FUNCTION PANEL
ALT: 13 U/L (ref 0–35)
AST: 18 U/L (ref 0–37)
Albumin: 4.2 g/dL (ref 3.5–5.2)
Alkaline Phosphatase: 62 U/L (ref 39–117)
Bilirubin, Direct: 0.2 mg/dL (ref 0.0–0.3)
Total Bilirubin: 1 mg/dL (ref 0.2–1.2)
Total Protein: 6.7 g/dL (ref 6.0–8.3)

## 2022-05-18 NOTE — Progress Notes (Unsigned)
Patient ID: Heidi Baldwin, female   DOB: 12-30-1923, 88 y.o.   MRN: [id number]   Subjective:    Patient ID: Heidi Baldwin, female    DOB: 1924/01/24, 88 y.o.   MRN: [id number]   Patient here for  Chief Complaint  Patient presents with   Hospitalization Follow-up    Pt had breathing issues and was in the hospital for a few days she stated she had fluid on her lungs. Pt states she is doing fine now but last night she fell and hurt her bottom.   Marland Kitchen   HPI Admitted 05/03/22 - 05/05/22 - after presenting with increased dyspnea.  Increased lower extremity swelling.  In ER - hypoxic 89% on room air and DOE.  Dx - exacerbation of heart failure.  Was diuresed.  S/p thoracentesis - c/w transudate.  Was discharged on oral lasix and amlodipine was held.  Recommended pacemaker interrogation.  Saw Dr Nehemiah Massed - 05/15/22 - no changes made.  Since discharge, she reports breathing has improved.  Taking lasix daily.  Remains off amlodipine.  Has been weighing daily - weight 142-144 pounds.  She did fall yesterday.  No syncope or near syncope.  No LOC.  States she stood up to trow rice bag to her son and lost her balance and fell.  States she sat on her bottom.  No head injury.  Sore lower buttock, but reports is better today.  Able to walk and stand without increased pain.  Discussed physical therapy.  She declines.     Past Medical History:  Diagnosis Date   Acid reflux    Allergy    Arthritis    Atrial fibrillation (HCC)    CHF (congestive heart failure) (HCC)    Chronic kidney disease    Heart disease    Heart murmur    Hx of actinic keratosis 11/20/2013   under left breast   Hypercholesterolemia    Hypertension    Hyponatremia    Hypothyroidism    Pulmonary HTN (HCC)    Past Surgical History:  Procedure Laterality Date   ABDOMINAL HYSTERECTOMY     CHOLECYSTECTOMY     CORNEAL TRANSPLANT     Right x 3 Left x1   CYST REMOVAL TRUNK     Tail bone   KNEE SURGERY Left    PACEMAKER INSERTION  05/03/13    Family History  Problem Relation Age of Onset   Diabetes Mother    Heart disease Father    Hyperlipidemia Father    Heart disease Sister    Arthritis Son    Social History   Socioeconomic History   Marital status: Widowed    Spouse name: Not on file   Number of children: Not on file   Years of education: Not on file   Highest education level: Not on file  Occupational History   Not on file  Tobacco Use   Smoking status: Never   Smokeless tobacco: Never  Vaping Use   Vaping Use: Never used  Substance and Sexual Activity   Alcohol use: No    Alcohol/week: 0.0 standard drinks of alcohol   Drug use: No   Sexual activity: Not on file  Other Topics Concern   Not on file  Social History Narrative   Not on file   Social Determinants of Health   Financial Resource Strain: Not on file  Food Insecurity: No Food Insecurity (05/04/2022)   Hunger Vital Sign    Worried About Running Out of Food  in the Last Year: Never true    [place] in the Last Year: Never true  Transportation Needs: No Transportation Needs (05/04/2022)   PRAPARE - Hydrologist (Medical): No    Lack of Transportation (Non-Medical): No  Physical Activity: Not on file  Stress: Not on file  Social Connections: Not on file     Review of Systems  Constitutional:  Negative for appetite change and unexpected weight change.  HENT:  Negative for congestion and sinus pressure.   Respiratory:  Negative for cough and chest tightness.        Breathing improved.   Cardiovascular:  Negative for chest pain and palpitations.       Leg swelling improved.   Gastrointestinal:  Negative for abdominal pain, diarrhea, nausea and vomiting.  Genitourinary:  Negative for difficulty urinating and dysuria.  Musculoskeletal:  Negative for joint swelling and myalgias.  Skin:  Negative for color change and rash.  Neurological:  Negative for dizziness and headaches.  Psychiatric/Behavioral:   Negative for agitation and dysphoric mood.        Objective:     BP (!) 146/82 (BP Location: Left Arm, Patient Position: Sitting, Cuff Size: Normal)   Pulse 83   Temp 98.2 F (36.8 C) (Oral)   Ht '5\' 5"'$  (1.651 m)   Wt 144 lb 3.2 oz (65.4 kg)   SpO2 98%   BMI 24.00 kg/m  Wt Readings from Last 3 Encounters:  05/18/22 144 lb 3.2 oz (65.4 kg)  05/11/22 145 lb 2 oz (65.8 kg)  05/04/22 141 lb 8.6 oz (64.2 kg)    Physical Exam Vitals reviewed.  Constitutional:      General: She is not in acute distress.    Appearance: Normal appearance.  HENT:     Head: Normocephalic and atraumatic.     Right Ear: External ear normal.     Left Ear: External ear normal.  Eyes:     General: No scleral icterus.       Right eye: No discharge.        Left eye: No discharge.     Conjunctiva/sclera: Conjunctivae normal.  Neck:     Thyroid: No thyromegaly.  Cardiovascular:     Rate and Rhythm: Normal rate.     Comments: Rate controlled.  Pulmonary:     Effort: No respiratory distress.     Breath sounds: Normal breath sounds. No wheezing.  Abdominal:     General: Bowel sounds are normal.     Palpations: Abdomen is soft.     Tenderness: There is no abdominal tenderness.  Musculoskeletal:        General: No swelling or tenderness.     Cervical back: Neck supple. No tenderness.  Lymphadenopathy:     Cervical: No cervical adenopathy.  Skin:    Findings: No erythema or rash.  Neurological:     Mental Status: She is alert.  Psychiatric:        Mood and Affect: Mood normal.        Behavior: Behavior normal.      Outpatient Encounter Medications as of 05/18/2022  Medication Sig   apixaban (ELIQUIS) 5 MG TABS tablet Take 5 mg by mouth 2 (two) times daily.    carvedilol (COREG) 3.125 MG tablet Take 1 tablet by mouth in the morning and at bedtime.   furosemide (LASIX) 20 MG tablet Take 1 tablet (20 mg total) by mouth daily.   levothyroxine (SYNTHROID) 50 MCG  tablet TAKE 1 TABLET BY MOUTH  DAILY 30 TO 60 MINUTES BEFORE BREAKFAST ON AN EMPTY STOMACH AND WITH GLASS OF WATER (Patient taking differently: Take 50 mcg by mouth daily before breakfast.)   losartan (COZAAR) 50 MG tablet Take 1 tablet (50 mg total) by mouth daily.   pantoprazole (PROTONIX) 40 MG tablet TAKE 1 TABLET(40 MG) BY MOUTH TWICE DAILY   spironolactone (ALDACTONE) 25 MG tablet TAKE 1/2 TABLET(12.5 MG) BY MOUTH DAILY   traMADol (ULTRAM) 50 MG tablet TAKE 1 TABLET BY MOUTH 2 TO 3 TIMES DAILY AS NEEDED   No facility-administered encounter medications on file as of 05/18/2022.    Reviewed and confirmed current medications.    Lab Results  Component Value Date   WBC 9.6 05/04/2022   HGB 13.1 05/04/2022   HCT 39.2 05/04/2022   PLT 226 05/04/2022   GLUCOSE 93 05/18/2022   CHOL 208 (H) 08/24/2021   TRIG 84.0 08/24/2021   HDL 83.00 08/24/2021   LDLCALC 109 (H) 08/24/2021   ALT 13 05/18/2022   AST 18 05/18/2022   NA 132 (L) 05/18/2022   K 3.8 05/18/2022   CL 91 (L) 05/18/2022   CREATININE 1.08 05/18/2022   BUN 18 05/18/2022   CO2 33 (H) 05/18/2022   TSH 3.549 05/03/2022   INR 1.04 04/19/2015   HGBA1C 5.4 09/25/2018    ECHOCARDIOGRAM COMPLETE  Result Date: 05/04/2022    ECHOCARDIOGRAM REPORT   Patient Name:   JUPITER BOYS Date of Exam: 05/04/2022 Medical Rec #:  [id number]    Height:       64.0 in Accession #:    [id number]   Weight:       147.0 lb Date of Birth:  [birth year]     BSA:          1.716 m Patient Age:    41 years     BP:           120/55 mmHg Patient Gender: F            HR:           62 bpm. Exam Location:  [facility] Procedure: 2D Echo Indications:    Dyspnea  History:        Patient has no prior history of Echocardiogram examinations.                 Pacemaker, Arrythmias:Atrial Fibrillation and Bradycardia; Risk                 Factors:Hypertension.  Sonographer:    Harvie Junior Referring Phys: [place]  1. Left ventricular ejection fraction, by estimation, is 45 to 50%. The  left ventricle has mildly decreased function. The left ventricle demonstrates global hypokinesis. Left ventricular diastolic parameters are consistent with Grade III diastolic dysfunction (restrictive).  2. Right ventricular systolic function is normal. The right ventricular size is mildly enlarged. There is normal pulmonary artery systolic pressure.  3. Left atrial size was mildly dilated.  4. Right atrial size was mildly dilated.  5. The mitral valve is normal in structure. Mild mitral valve regurgitation.  6. The aortic valve is calcified. Aortic valve regurgitation is trivial. FINDINGS  Left Ventricle: Left ventricular ejection fraction, by estimation, is 45 to 50%. The left ventricle has mildly decreased function. The left ventricle demonstrates global hypokinesis. The left ventricular internal cavity size was normal in size. There is  borderline concentric left ventricular hypertrophy. Left ventricular diastolic parameters are consistent with Grade  III diastolic dysfunction (restrictive). Right Ventricle: The right ventricular size is mildly enlarged. No increase in right ventricular wall thickness. Right ventricular systolic function is normal. There is normal pulmonary artery systolic pressure. The tricuspid regurgitant velocity is 2.57  m/s, and with an assumed right atrial pressure of 3 mmHg, the estimated right ventricular systolic pressure is 17.5 mmHg. Left Atrium: Left atrial size was mildly dilated. Right Atrium: Right atrial size was mildly dilated. Pericardium: There is no evidence of pericardial effusion. Mitral Valve: The mitral valve is normal in structure. Mild mitral valve regurgitation. MV peak gradient, 7.8 mmHg. The mean mitral valve gradient is 3.0 mmHg. Tricuspid Valve: The tricuspid valve is normal in structure. Tricuspid valve regurgitation is mild. Aortic Valve: The aortic valve is calcified. Aortic valve regurgitation is trivial. Aortic valve mean gradient measures 5.5 mmHg. Aortic  valve peak gradient measures 11.2 mmHg. Aortic valve area, by VTI measures 2.32 cm. Pulmonic Valve: The pulmonic valve was grossly normal. Pulmonic valve regurgitation is mild. No evidence of pulmonic stenosis. Aorta: The ascending aorta was not well visualized. IAS/Shunts: No atrial level shunt detected by color flow Doppler. Additional Comments: A device lead is visualized. There is a small pleural effusion in the left lateral region.  LEFT VENTRICLE PLAX 2D LVIDd:         3.40 cm     Diastology LVIDs:         2.70 cm     LV e' medial:    9.46 cm/s LV PW:         0.90 cm     LV E/e' medial:  14.2 LV IVS:        1.20 cm     LV e' lateral:   10.30 cm/s LVOT diam:     1.80 cm     LV E/e' lateral: 13.0 LV SV:         76 LV SV Index:   44 LVOT Area:     2.54 cm  LV Volumes (MOD) LV vol d, MOD A2C: 53.4 ml LV vol d, MOD A4C: 48.0 ml LV vol s, MOD A2C: 20.2 ml LV vol s, MOD A4C: 21.2 ml LV SV MOD A2C:     33.2 ml LV SV MOD A4C:     48.0 ml LV SV MOD BP:      30.9 ml RIGHT VENTRICLE RV Basal diam:  4.00 cm RV Mid diam:    4.00 cm RV S prime:     12.60 cm/s TAPSE (M-mode): 1.0 cm LEFT ATRIUM              Index        RIGHT ATRIUM           Index LA diam:        4.20 cm  2.45 cm/m   RA Area:     18.50 cm LA Vol (A2C):   103.0 ml 60.01 ml/m  RA Volume:   46.10 ml  26.86 ml/m LA Vol (A4C):   87.4 ml  50.92 ml/m LA Biplane Vol: 95.0 ml  55.35 ml/m  AORTIC VALVE                     PULMONIC VALVE AV Area (Vmax):    2.43 cm      PV Vmax:          1.06 m/s AV Area (Vmean):   2.36 cm      PV Peak grad:  4.5 mmHg AV Area (VTI):     2.32 cm      PR End Diast Vel: 6.45 msec AV Vmax:           167.50 cm/s AV Vmean:          110.000 cm/s AV VTI:            0.328 m AV Peak Grad:      11.2 mmHg AV Mean Grad:      5.5 mmHg LVOT Vmax:         160.00 cm/s LVOT Vmean:        102.000 cm/s LVOT VTI:          0.299 m LVOT/AV VTI ratio: 0.91  AORTA Ao Root diam: 3.10 cm MITRAL VALVE                TRICUSPID VALVE MV Area (PHT):  2.39 cm     TR Peak grad:   26.4 mmHg MV Area VTI:   1.90 cm     TR Vmax:        257.00 cm/s MV Peak grad:  7.8 mmHg MV Mean grad:  3.0 mmHg     SHUNTS MV Vmax:       1.40 m/s     Systemic VTI:  0.30 m MV Vmean:      73.1 cm/s    Systemic Diam: 1.80 cm MV Decel Time: 317 msec MR Peak grad: 48.3 mmHg MR Mean grad: 41.0 mmHg MR Vmax:      347.67 cm/s MR Vmean:     303.0 cm/s MV E velocity: 134.00 cm/s MV A velocity: 31.60 cm/s MV E/A ratio:  4.24 Dwayne D Callwood MD Electronically signed by Yolonda Kida MD Signature Date/Time: 05/04/2022/4:42:32 PM    Final    US THORACENTESIS ASP PLEURAL SPACE W/IMG GUIDE  Result Date: 05/04/2022 INDICATION: Right pleural effusion EXAM: ULTRASOUND GUIDED RIGHT THORACENTESIS MEDICATIONS: 6 cc 1% lidocaine COMPLICATIONS: None immediate.  No pneumothorax on follow-up radiograph. PROCEDURE: An ultrasound guided thoracentesis was thoroughly discussed with the patient and questions answered. The benefits, risks, alternatives and complications were also discussed. The patient understands and wishes to proceed with the procedure. Written consent was obtained. Ultrasound was performed to localize and mark an adequate pocket of fluid in the right chest. The area was then prepped and draped in the normal sterile fashion. 1% Lidocaine was used for local anesthesia. Under ultrasound guidance a 6 Fr Safe-T-Centesis catheter was introduced. Thoracentesis was performed. The catheter was removed and a dressing applied. FINDINGS: A total of approximately 1.2 L of clear yellow fluid was removed. Samples were sent to the laboratory as requested by the clinical team. IMPRESSION: Successful ultrasound guided right thoracentesis yielding 1.2 L of pleural fluid. Read by: Reatha Armour, PA-C Electronically Signed   By: Lucrezia Europe M.D.   On: 05/04/2022 14:58   DG Chest Port 1 View  Result Date: 05/04/2022 CLINICAL DATA:  Thoracentesis. EXAM: PORTABLE CHEST 1 VIEW COMPARISON:  Chest x-ray  October 12, 23. FINDINGS: Bilateral pleural effusions, decreased in size on the right. Probable overlying atelectasis. No visible pneumothorax. Unchanged cardiomediastinal silhouette. Left subclavian approach cardiac rhythm maintenance device. IMPRESSION: Bilateral pleural effusions, decreased in size on the right. Probable overlying atelectasis. No visible pneumothorax. Electronically Signed   By: Margaretha Sheffield M.D.   On: 05/04/2022 12:21       Assessment & Plan:   Problem List Items Addressed This Visit     Acid reflux  Upper symptoms controlled on protonix.       Atrial fibrillation ([place])    Continue eliquis.  Taking coreg. No increased heart rate or palpitations.  Pacemaker in place.        Chronic kidney disease, stage 3 unspecified ([place])    Continue to avoid antiinflammatories.  Stay hydrated.  Follow metabolic panel - check today. Continue losartan.       Essential hypertension, benign    Continue losartan, amlodipine, coreg. On lasix now daily.  Off amlodipine.  Blood pressure as outlined.  Follow pressures.  Follow metabolic panel.       Relevant Orders   Basic metabolic panel (Completed)   Fall    Recent fall as outlined.  No pain with walking - weight beating.  No pain with abduction/adduction.  No head injury.  Discussed xray.  She declines.  States she is doing well.  Discussed importance of using her walking.  Discussed home health - PT.  She declines.  Follow.        History of heart failure    Admitted with heart failure exacerbation.  Off amlodipine now.  Taking lasix daily.  Breathing better.  Check metabolic panel and liver panel today to confirm stable/normal - especially given now on daily diuretic.  Continue to monitor daily weights.  Avoid increased salt intake.  Agree with cardiac rehab.        Hypercholesterolemia - Primary    Follow lipid panel.      Relevant Orders   Hepatic function panel (Completed)   Hypothyroidism    On thyroid  replacement.  Follow tsh.       Other thrombophilia ([place])    History of afib. On eliquis.        Pulmonary hypertension ([place])    Followed by cardiology.       Renal cell carcinoma ([place])    Has a known renal mass.  Declines further w/up and evaluation.       Sick sinus syndrome ([place])    Pacemaker in place.  Followed by cardiology.  Recommended pacemaker interrogation.          Einar Pheasant, MD

## 2022-05-19 ENCOUNTER — Encounter: Payer: Self-pay | Admitting: Internal Medicine

## 2022-05-19 DIAGNOSIS — Z8679 Personal history of other diseases of the circulatory system: Secondary | ICD-10-CM | POA: Insufficient documentation

## 2022-05-19 DIAGNOSIS — W19XXXA Unspecified fall, initial encounter: Secondary | ICD-10-CM | POA: Insufficient documentation

## 2022-05-19 NOTE — Assessment & Plan Note (Signed)
Recent fall as outlined.  No pain with walking - weight beating.  No pain with abduction/adduction.  No head injury.  Discussed xray.  She declines.  States she is doing well.  Discussed importance of using her walking.  Discussed home health - PT.  She declines.  Follow.

## 2022-05-19 NOTE — Assessment & Plan Note (Signed)
Pacemaker in place.  Followed by cardiology.  Recommended pacemaker interrogation.

## 2022-05-19 NOTE — Assessment & Plan Note (Signed)
Followed by cardiology 

## 2022-05-19 NOTE — Assessment & Plan Note (Signed)
Continue losartan, amlodipine, coreg. On lasix now daily.  Off amlodipine.  Blood pressure as outlined.  Follow pressures.  Follow metabolic panel.

## 2022-05-19 NOTE — Assessment & Plan Note (Signed)
Has a known renal mass.  Declines further w/up and evaluation.

## 2022-05-19 NOTE — Assessment & Plan Note (Addendum)
Follow lipid panel.   

## 2022-05-19 NOTE — Assessment & Plan Note (Signed)
Upper symptoms controlled on protonix.  

## 2022-05-19 NOTE — Assessment & Plan Note (Signed)
Continue to avoid antiinflammatories.  Stay hydrated.  Follow metabolic panel - check today. Continue losartan.

## 2022-05-19 NOTE — Assessment & Plan Note (Signed)
History of afib. On eliquis.

## 2022-05-19 NOTE — Assessment & Plan Note (Signed)
Admitted with heart failure exacerbation.  Off amlodipine now.  Taking lasix daily.  Breathing better.  Check metabolic panel and liver panel today to confirm stable/normal - especially given now on daily diuretic.  Continue to monitor daily weights.  Avoid increased salt intake.  Agree with cardiac rehab.

## 2022-05-19 NOTE — Assessment & Plan Note (Signed)
On thyroid replacement.  Follow tsh.  

## 2022-05-19 NOTE — Assessment & Plan Note (Signed)
Continue eliquis.  Taking coreg. No increased heart rate or palpitations.  Pacemaker in place.

## 2022-05-21 ENCOUNTER — Other Ambulatory Visit: Payer: Self-pay

## 2022-05-21 ENCOUNTER — Encounter: Payer: Self-pay | Admitting: Internal Medicine

## 2022-05-21 ENCOUNTER — Emergency Department: Payer: Medicare HMO

## 2022-05-21 ENCOUNTER — Inpatient Hospital Stay
Admission: EM | Admit: 2022-05-21 | Discharge: 2022-05-23 | DRG: 951 | Disposition: E | Payer: Medicare HMO | Attending: Student in an Organized Health Care Education/Training Program | Admitting: Student in an Organized Health Care Education/Training Program

## 2022-05-21 DIAGNOSIS — Z947 Corneal transplant status: Secondary | ICD-10-CM

## 2022-05-21 DIAGNOSIS — Z888 Allergy status to other drugs, medicaments and biological substances status: Secondary | ICD-10-CM

## 2022-05-21 DIAGNOSIS — M1990 Unspecified osteoarthritis, unspecified site: Secondary | ICD-10-CM | POA: Diagnosis present

## 2022-05-21 DIAGNOSIS — Z8249 Family history of ischemic heart disease and other diseases of the circulatory system: Secondary | ICD-10-CM | POA: Diagnosis not present

## 2022-05-21 DIAGNOSIS — E871 Hypo-osmolality and hyponatremia: Secondary | ICD-10-CM | POA: Diagnosis not present

## 2022-05-21 DIAGNOSIS — Z515 Encounter for palliative care: Principal | ICD-10-CM

## 2022-05-21 DIAGNOSIS — R402352 Coma scale, best motor response, localizes pain, at arrival to emergency department: Secondary | ICD-10-CM | POA: Diagnosis present

## 2022-05-21 DIAGNOSIS — I629 Nontraumatic intracranial hemorrhage, unspecified: Secondary | ICD-10-CM | POA: Diagnosis not present

## 2022-05-21 DIAGNOSIS — Z833 Family history of diabetes mellitus: Secondary | ICD-10-CM

## 2022-05-21 DIAGNOSIS — Y92019 Unspecified place in single-family (private) house as the place of occurrence of the external cause: Secondary | ICD-10-CM

## 2022-05-21 DIAGNOSIS — R402122 Coma scale, eyes open, to pain, at arrival to emergency department: Secondary | ICD-10-CM | POA: Diagnosis not present

## 2022-05-21 DIAGNOSIS — Z7901 Long term (current) use of anticoagulants: Secondary | ICD-10-CM | POA: Diagnosis not present

## 2022-05-21 DIAGNOSIS — R404 Transient alteration of awareness: Secondary | ICD-10-CM | POA: Diagnosis not present

## 2022-05-21 DIAGNOSIS — Z950 Presence of cardiac pacemaker: Secondary | ICD-10-CM | POA: Diagnosis not present

## 2022-05-21 DIAGNOSIS — Z882 Allergy status to sulfonamides status: Secondary | ICD-10-CM | POA: Diagnosis not present

## 2022-05-21 DIAGNOSIS — Z66 Do not resuscitate: Secondary | ICD-10-CM | POA: Diagnosis present

## 2022-05-21 DIAGNOSIS — I272 Pulmonary hypertension, unspecified: Secondary | ICD-10-CM | POA: Diagnosis not present

## 2022-05-21 DIAGNOSIS — E039 Hypothyroidism, unspecified: Secondary | ICD-10-CM | POA: Diagnosis present

## 2022-05-21 DIAGNOSIS — J811 Chronic pulmonary edema: Secondary | ICD-10-CM | POA: Diagnosis not present

## 2022-05-21 DIAGNOSIS — I10 Essential (primary) hypertension: Secondary | ICD-10-CM | POA: Diagnosis present

## 2022-05-21 DIAGNOSIS — R001 Bradycardia, unspecified: Secondary | ICD-10-CM | POA: Diagnosis not present

## 2022-05-21 DIAGNOSIS — R0902 Hypoxemia: Secondary | ICD-10-CM | POA: Diagnosis present

## 2022-05-21 DIAGNOSIS — N1832 Chronic kidney disease, stage 3b: Secondary | ICD-10-CM | POA: Diagnosis present

## 2022-05-21 DIAGNOSIS — W19XXXA Unspecified fall, initial encounter: Secondary | ICD-10-CM | POA: Diagnosis present

## 2022-05-21 DIAGNOSIS — R4182 Altered mental status, unspecified: Secondary | ICD-10-CM | POA: Diagnosis not present

## 2022-05-21 DIAGNOSIS — I5022 Chronic systolic (congestive) heart failure: Secondary | ICD-10-CM | POA: Diagnosis present

## 2022-05-21 DIAGNOSIS — E7800 Pure hypercholesterolemia, unspecified: Secondary | ICD-10-CM | POA: Diagnosis present

## 2022-05-21 DIAGNOSIS — I4891 Unspecified atrial fibrillation: Secondary | ICD-10-CM | POA: Diagnosis not present

## 2022-05-21 DIAGNOSIS — J90 Pleural effusion, not elsewhere classified: Secondary | ICD-10-CM | POA: Diagnosis not present

## 2022-05-21 DIAGNOSIS — Z79890 Hormone replacement therapy (postmenopausal): Secondary | ICD-10-CM

## 2022-05-21 DIAGNOSIS — Z79899 Other long term (current) drug therapy: Secondary | ICD-10-CM

## 2022-05-21 DIAGNOSIS — R1110 Vomiting, unspecified: Secondary | ICD-10-CM | POA: Diagnosis not present

## 2022-05-21 DIAGNOSIS — I447 Left bundle-branch block, unspecified: Secondary | ICD-10-CM | POA: Diagnosis present

## 2022-05-21 DIAGNOSIS — Z83438 Family history of other disorder of lipoprotein metabolism and other lipidemia: Secondary | ICD-10-CM

## 2022-05-21 DIAGNOSIS — R402232 Coma scale, best verbal response, inappropriate words, at arrival to emergency department: Secondary | ICD-10-CM | POA: Diagnosis present

## 2022-05-21 DIAGNOSIS — Z9181 History of falling: Secondary | ICD-10-CM

## 2022-05-21 DIAGNOSIS — I130 Hypertensive heart and chronic kidney disease with heart failure and stage 1 through stage 4 chronic kidney disease, or unspecified chronic kidney disease: Secondary | ICD-10-CM | POA: Diagnosis present

## 2022-05-21 DIAGNOSIS — J690 Pneumonitis due to inhalation of food and vomit: Secondary | ICD-10-CM | POA: Diagnosis not present

## 2022-05-21 DIAGNOSIS — T17920A Food in respiratory tract, part unspecified causing asphyxiation, initial encounter: Secondary | ICD-10-CM | POA: Diagnosis not present

## 2022-05-21 DIAGNOSIS — Z043 Encounter for examination and observation following other accident: Secondary | ICD-10-CM | POA: Diagnosis not present

## 2022-05-21 DIAGNOSIS — S0990XA Unspecified injury of head, initial encounter: Secondary | ICD-10-CM | POA: Diagnosis not present

## 2022-05-21 LAB — CBC WITH DIFFERENTIAL/PLATELET
Abs Immature Granulocytes: 0.08 10*3/uL — ABNORMAL HIGH (ref 0.00–0.07)
Basophils Absolute: 0.1 10*3/uL (ref 0.0–0.1)
Basophils Relative: 0 %
Eosinophils Absolute: 0 10*3/uL (ref 0.0–0.5)
Eosinophils Relative: 0 %
HCT: 35.6 % — ABNORMAL LOW (ref 36.0–46.0)
Hemoglobin: 11.8 g/dL — ABNORMAL LOW (ref 12.0–15.0)
Immature Granulocytes: 1 %
Lymphocytes Relative: 3 %
Lymphs Abs: 0.5 10*3/uL — ABNORMAL LOW (ref 0.7–4.0)
MCH: 29.2 pg (ref 26.0–34.0)
MCHC: 33.1 g/dL (ref 30.0–36.0)
MCV: 88.1 fL (ref 80.0–100.0)
Monocytes Absolute: 1.1 10*3/uL — ABNORMAL HIGH (ref 0.1–1.0)
Monocytes Relative: 8 %
Neutro Abs: 13.3 10*3/uL — ABNORMAL HIGH (ref 1.7–7.7)
Neutrophils Relative %: 88 %
Platelets: 173 10*3/uL (ref 150–400)
RBC: 4.04 MIL/uL (ref 3.87–5.11)
RDW: 12.3 % (ref 11.5–15.5)
WBC: 15 10*3/uL — ABNORMAL HIGH (ref 4.0–10.5)
nRBC: 0 % (ref 0.0–0.2)

## 2022-05-21 LAB — COMPREHENSIVE METABOLIC PANEL
ALT: 14 U/L (ref 0–44)
AST: 19 U/L (ref 15–41)
Albumin: 3.9 g/dL (ref 3.5–5.0)
Alkaline Phosphatase: 55 U/L (ref 38–126)
Anion gap: 11 (ref 5–15)
BUN: 17 mg/dL (ref 8–23)
CO2: 27 mmol/L (ref 22–32)
Calcium: 8.9 mg/dL (ref 8.9–10.3)
Chloride: 91 mmol/L — ABNORMAL LOW (ref 98–111)
Creatinine, Ser: 0.92 mg/dL (ref 0.44–1.00)
GFR, Estimated: 57 mL/min — ABNORMAL LOW (ref >60)
Glucose, Bld: 158 mg/dL — ABNORMAL HIGH (ref 70–99)
Potassium: 3.7 mmol/L (ref 3.5–5.1)
Sodium: 129 mmol/L — ABNORMAL LOW (ref 135–145)
Total Bilirubin: 2.1 mg/dL — ABNORMAL HIGH (ref 0.3–1.2)
Total Protein: 7.1 g/dL (ref 6.5–8.1)

## 2022-05-21 LAB — BRAIN NATRIURETIC PEPTIDE: B Natriuretic Peptide: 699 pg/mL — ABNORMAL HIGH (ref 0.0–100.0)

## 2022-05-21 LAB — LACTIC ACID, PLASMA: Lactic Acid, Venous: 1.2 mmol/L (ref 0.5–1.9)

## 2022-05-21 LAB — PROTIME-INR
INR: 1.8 — ABNORMAL HIGH (ref 0.8–1.2)
Prothrombin Time: 20.6 seconds — ABNORMAL HIGH (ref 11.4–15.2)

## 2022-05-21 LAB — APTT: aPTT: 34 seconds (ref 24–36)

## 2022-05-21 LAB — LIPASE, BLOOD: Lipase: 27 U/L (ref 11–51)

## 2022-05-21 LAB — TROPONIN I (HIGH SENSITIVITY): Troponin I (High Sensitivity): 21 ng/L — ABNORMAL HIGH (ref <18)

## 2022-05-21 MED ORDER — ACETAMINOPHEN 650 MG RE SUPP
650.0000 mg | Freq: Four times a day (QID) | RECTAL | Status: DC | PRN
  Administered 2022-05-21: 650 mg via RECTAL
  Filled 2022-05-21: qty 1

## 2022-05-21 MED ORDER — HALOPERIDOL LACTATE 5 MG/ML IJ SOLN: 0.5000 mg | INTRAMUSCULAR | Status: DC | PRN

## 2022-05-21 MED ORDER — GLYCOPYRROLATE 0.2 MG/ML IJ SOLN: 0.2000 mg | INTRAMUSCULAR | Status: DC | PRN

## 2022-05-21 MED ORDER — POLYVINYL ALCOHOL 1.4 % OP SOLN: 1.0000 [drp] | Freq: Four times a day (QID) | OPHTHALMIC | Status: DC | PRN

## 2022-05-21 MED ORDER — HALOPERIDOL LACTATE 2 MG/ML PO CONC: 0.5000 mg | ORAL | Status: DC | PRN

## 2022-05-21 MED ORDER — ONDANSETRON 4 MG PO TBDP: 4.0000 mg | ORAL_TABLET | Freq: Four times a day (QID) | ORAL | Status: DC | PRN

## 2022-05-21 MED ORDER — GLYCOPYRROLATE 1 MG PO TABS: 1.0000 mg | ORAL_TABLET | ORAL | Status: DC | PRN

## 2022-05-21 MED ORDER — ONDANSETRON HCL 4 MG/2ML IJ SOLN: 4.0000 mg | Freq: Four times a day (QID) | INTRAMUSCULAR | Status: DC | PRN

## 2022-05-21 MED ORDER — HALOPERIDOL 0.5 MG PO TABS: 0.5000 mg | ORAL_TABLET | ORAL | Status: DC | PRN

## 2022-05-21 MED ORDER — ACETAMINOPHEN 325 MG PO TABS: 650.0000 mg | ORAL_TABLET | Freq: Four times a day (QID) | ORAL | Status: DC | PRN

## 2022-05-21 MED ORDER — BIOTENE DRY MOUTH MT LIQD: 15.0000 mL | OROMUCOSAL | Status: DC | PRN

## 2022-05-21 NOTE — ED Notes (Signed)
Pt moved to hospital bed for comfort.

## 2022-05-21 NOTE — ED Triage Notes (Signed)
Pt from home with ams, vomiting. Per ems pt fell two days ago and is on eliquis. Pt does not follow commands, is pale. Md at bedside.

## 2022-05-21 NOTE — ED Notes (Signed)
Head of bed elevated

## 2022-05-21 NOTE — ED Notes (Addendum)
Pt resting, head of bed remains elevated, son denies current needs, pt does not appear to be in any distress. Son does not know which brand of pacemaker/defibrillator  pt has, called 1-800-cardiac, pt does not have that brand. Will continue to investigate chart.

## 2022-05-21 NOTE — ED Notes (Signed)
Report to tiffany, rn.

## 2022-05-21 NOTE — ED Provider Notes (Signed)
[facility] Provider Note    Event Date/Time   First MD Initiated Contact with Patient 05/04/2022 [phone]     (approximate)   History   Altered Mental Status   HPI Level 5 caveat:  history/ROS limited by acute/critical illness  Heidi Baldwin is a 88 y.o. female who was reportedly verbal, conversant, and has no history of dementia at baseline who also is reportedly on Eliquis.  She presents by EMS for altered mental status after an episode at home.  The patient's son called EMS after finding her during the night making strange gurgling noises, having apparently vomited and aspirated some of it.  The patient will respond to painful stimuli and initially answered simple questions on behalf of the paramedics, but is not speaking to me at all.  She will not open her eyes on command and in fact will try to resist to keep them closed.  This is not at all her baseline mental status according to paramedics (told to them by the son).  However, the son reports that the patient had a fall a couple of days ago.  She refused to go to the emergency department or have any imaging done, but she did follow-up with her PCP.  She did not have any imaging because she was refusing care.  No other details are available.     Physical Exam   Triage Vital Signs: ED Triage Vitals  Enc Vitals Group     BP 04/26/2022 0425 (!) 153/73     Pulse Rate 04/24/2022 0425 61     Resp 05/02/2022 0425 14     Temp 04/30/2022 0443 98.8 F (37.1 C)     Temp Source 05/13/2022 0425 Axillary     SpO2 05/02/2022 0425 92 %     Weight 05/05/2022 0425 66 kg (145 lb 8.1 oz)     Height 05/09/2022 0425 1.651 m ('5\' 5"'$ )     Head Circumference --      Peak Flow --      Pain Score --      Pain Loc --      Pain Edu? --      Excl. in [place]? --     Most recent vital signs: Vitals:   05/20/2022 0600 05/17/2022 0630  BP: (!) 128/57   Pulse: 60 70  Resp: 15   Temp:    SpO2: 95% 98%     General: Patient seems to be awake and  occasionally will make sounds and say words but will not respond to any verbal questions.  She will cry out and withdraw away from painful stimuli. CV:  Good peripheral perfusion.  Regular rate and rhythm.  Normal heart sounds. Resp:  Normal effort.  Lungs are clear to auscultation bilaterally. Abd:  No distention.  No tenderness to palpation the abdomen. Other:  Pupils are small bilaterally and minimally responsive but they are equal.  She will not participate or cooperate with a neurological exam.   ED Results / Procedures / Treatments   Labs (all labs ordered are listed, but only abnormal results are displayed) Labs Reviewed  COMPREHENSIVE METABOLIC PANEL - Abnormal; Notable for the following components:      Result Value   Sodium 129 (*)    Chloride 91 (*)    Glucose, Bld 158 (*)    Total Bilirubin 2.1 (*)    GFR, Estimated 57 (*)    All other components within normal limits  CBC WITH DIFFERENTIAL/PLATELET -  Abnormal; Notable for the following components:   WBC 15.0 (*)    Hemoglobin 11.8 (*)    HCT 35.6 (*)    Neutro Abs 13.3 (*)    Lymphs Abs 0.5 (*)    Monocytes Absolute 1.1 (*)    Abs Immature Granulocytes 0.08 (*)    All other components within normal limits  PROTIME-INR - Abnormal; Notable for the following components:   Prothrombin Time 20.6 (*)    INR 1.8 (*)    All other components within normal limits  BRAIN NATRIURETIC PEPTIDE - Abnormal; Notable for the following components:   B Natriuretic Peptide 699.0 (*)    All other components within normal limits  TROPONIN I (HIGH SENSITIVITY) - Abnormal; Notable for the following components:   Troponin I (High Sensitivity) 21 (*)    All other components within normal limits  URINE CULTURE  SARS CORONAVIRUS 2 BY RT PCR  LACTIC ACID, PLASMA  APTT  LIPASE, BLOOD     EKG  ED ECG REPORT I, Hinda Kehr, the attending physician, personally viewed and interpreted this ECG.  Date: 04/27/2022 EKG Time: 4:23 AM Rate:  63 Rhythm: Junctional rhythm QRS Axis: paced rhythm Intervals: Left bundle branch block ST/T Wave abnormalities: Non-specific ST segment / T-wave changes, but no evidence of acute ischemia. Narrative Interpretation: No acute ischemia evidence of paced rhythm    RADIOLOGY I viewed and interpreted the patient's CT head and discussed it with the radiologist.  See hospital course for details, but she has a large head bleed.  I viewed and interpreted the patient's chest x-ray and there is a little bit of pulmonary congestion but no focal pneumonia.  PROCEDURES:  Critical Care performed: Yes, see critical care procedure note(s)  .1-3 Lead EKG Interpretation  Performed by: Hinda Kehr, MD Authorized by: Hinda Kehr, MD     Interpretation: normal     ECG rate:  70   ECG rate assessment: normal     Rhythm: sinus rhythm     Ectopy: none     Conduction: normal   .Critical Care  Performed by: Hinda Kehr, MD Authorized by: Hinda Kehr, MD   Critical care provider statement:    Critical care time (minutes):  60   Critical care time was exclusive of:  Separately billable procedures and treating other patients   Critical care was necessary to treat or prevent imminent or life-threatening deterioration of the following conditions:  CNS failure or compromise   Critical care was time spent personally by me on the following activities:  Development of treatment plan with patient or surrogate, evaluation of patient's response to treatment, examination of patient, obtaining history from patient or surrogate, ordering and performing treatments and interventions, ordering and review of laboratory studies, ordering and review of radiographic studies, pulse oximetry, re-evaluation of patient's condition and review of old charts    MEDICATIONS ORDERED IN ED: Medications  acetaminophen (TYLENOL) tablet 650 mg (has no administration in time range)    Or  acetaminophen (TYLENOL) suppository  650 mg (has no administration in time range)  haloperidol (HALDOL) tablet 0.5 mg (has no administration in time range)    Or  haloperidol (HALDOL) 2 MG/ML solution 0.5 mg (has no administration in time range)    Or  haloperidol lactate (HALDOL) injection 0.5 mg (has no administration in time range)  ondansetron (ZOFRAN-ODT) disintegrating tablet 4 mg (has no administration in time range)    Or  ondansetron (ZOFRAN) injection 4 mg (has no administration in  time range)  glycopyrrolate (ROBINUL) tablet 1 mg (has no administration in time range)    Or  glycopyrrolate (ROBINUL) injection 0.2 mg (has no administration in time range)    Or  glycopyrrolate (ROBINUL) injection 0.2 mg (has no administration in time range)  polyvinyl alcohol (LIQUIFILM TEARS) 1.4 % ophthalmic solution 1 drop (has no administration in time range)     IMPRESSION / MDM / ASSESSMENT AND PLAN / ED COURSE  I reviewed the triage vital signs and the nursing notes.                              Differential diagnosis includes, but is not limited to, acute intracranial hemorrhage, CVA, medication or drug side effect, acute infectious process/sepsis  Patient's presentation is most consistent with acute presentation with potential threat to life or bodily function.  Labs/studies ordered: EKG, chest x-ray, CMP, CBC with differential, lactic acid, pro time-INR, high-sensitivity troponin, BNP, lipase, COVID-19 PCR, head CT without contrast, cervical spine CT without contrast.  The patient is on the cardiac monitor to evaluate for evidence of arrhythmia and/or significant heart rate changes.  Given her reported recent history of a fall and her acute mental status change, I am concerned about an intracranial bleed.  As soon as her blood is drawn we are sending her off of the CT scan.   Clinical Course as of 04/24/2022 0835  Mon May 21, 2022  0454 CT Head Wo Contrast I viewed and interpreted the patient's head CT.  She has a  very large acute intracranial hemorrhage.  There is mass effect and it is clearly a devastating injury.  I went back to the room and found that her son, who is her healthcare power of attorney, is present in the room.  The patient remains nonverbal but does not seem to be in distress.  Her son says that she was adamant she does not want any life-saving measures including intubation and chest compressions.  I discussed with him the devastating injury present on her CT scan.  I explained that we can treat aggressively including consulting neurosurgery, reversing Eliquis, etc., or we can admit her to the hospital for comfort care understanding that this injury will likely end her life.  He said that he understands and he does not want to do anything else, just keep her comfortable.  Given the severity of the intracranial injury as well as her age, I think this is very reasonable.  I made the patient DNR and I am consulting the hospitalist for admission for comfort care.  There is no indication to call neurosurgery at this time as the family does not want any aggressive intervention.  Similarly, I will not order Eliquis reversal. [CF]  0510 Consulted with Dr. Damita Dunnings with the hospitalist service.  We discussed the case in detail and she understands and agrees with the plan for admission for comfort care [CF]    Clinical Course User Index [CF] Hinda Kehr, MD     FINAL CLINICAL IMPRESSION(S) / ED DIAGNOSES   Final diagnoses:  Acute intracranial hemorrhage ([place])     Rx / DC Orders   ED Discharge Orders     None        Note:  This document was prepared using Dragon voice recognition software and may include unintentional dictation errors.   Hinda Kehr, MD 04/30/2022 [phone]

## 2022-05-21 NOTE — Assessment & Plan Note (Addendum)
History of recent fall Chronic anticoagulation Comfort care measures only per orders Inpatient hospice referral

## 2022-05-21 NOTE — IPAL (Signed)
  Interdisciplinary Goals of Care Family Meeting   Date carried out: 05/18/2022  Location of the meeting: Bedside  Member's involved: Physician and Family Member or next of kin  Durable Power of Attorney or acting medical decision maker: son, Heidi Baldwin    Discussion: We discussed goals of care for Heidi Baldwin .    Code status: Full DNR  Disposition: In-patient comfort care  Time spent for the meeting: [place], MD  04/30/2022, 8:44 PM

## 2022-05-21 NOTE — Telephone Encounter (Signed)
Called and spoke to son Susa Raring

## 2022-05-21 NOTE — ED Notes (Signed)
Reviewed chart, appears pt has a pacemaker only, called [employer] to confirm, pt has a pacemaker only.

## 2022-05-21 NOTE — ED Notes (Signed)
Assumed care of pt. Pt resting in bed, denies any needs at this time. Foley assessed, draining properly. Pt's vitals signs stable

## 2022-05-21 NOTE — ED Notes (Signed)
Md speaking with son

## 2022-05-21 NOTE — H&P (Addendum)
History and Physical    Patient: Heidi Baldwin EQA:[id number] DOB: Jan 19, 1924 DOA: 05/09/2022 DOS: the patient was seen and examined on 05/10/2022 PCP: Einar Pheasant, MD  Patient coming from: Home  Chief Complaint:  Chief Complaint  Patient presents with   Altered Mental Status    HPI: Heidi Baldwin is a 88 y.o. female with medical history significant for Atrial fibrillation on Eliquis, HTN, sick sinus syndrome s/p pacemaker, pulmonary hypertension, hypothyroidism, CKD stage IIIb, hospitalized recently from 10/12 to 05/05/2022 with CHF exacerbation and pleural effusion requiring thoracentesis Who presents to the ED via EMS with concerns for aspiration.  History is given by the son and POA at the bedside who stated that patient fell about 5 days prior but did not want come to the emergency room.  Since the fall she had been complaining of hip pain and headache and just before bedtime tonight she took a tramadol.  He said after she fell asleep he heard gurgling and then went in to her room to see her lying in her own vomit and she was mostly unresponsive.  He called EMS and on their arrival she was a bit more responsive but very combative.  Since arrival to the emergency room however, she has been progressively more obtunded.  H ED course and data review: BP 153/73 with otherwise normal vitals though mild hypoxia of 92% on room air.  Troponin 21 and BNP 699, INR 1.8.  Sodium 129 and total bili 2.1.  WBC 15,000 with hemoglobin 11.8 lactic acid 1.2.  EKG, personally viewed and interpreted showing a junctional rhythm of 63 and a left bundle branch block. CT head significant for a large acute intracranial hemorrhage with a 4 to 8 leftward mm midline shift Chest x-ray showed small bilateral pleural effusions and pulmonary vascular congestion IMPRESSION: 1. Multifocal Large acute intracranial hemorrhage: - 77 mL anterior right frontal lobe intra-axial hematoma. - moderate volume aneurysmal pattern  Subarachnoid Hemorrhage epicenter at the suprasellar cistern and right sylvian fissure. - 2-3 mm right side and para falcine Subdural Hematoma. - moderate volume intraventricular hemorrhage.   2. Recommend stat CTA head and neck and Neurosurgery consultation.   3. Intracranial mass effect on the right lateral ventricle and leftward midline shift of between 4-8 mm. No ventriculomegaly.  Emergency room provider spoke with patient's son who stated that patient had clear wishes to be DNR and after outlining prognosis of recovery to [facility], he elected to make patient comfort care.  Hospitalist consulted for admission.     Past Medical History:  Diagnosis Date   Acid reflux    Allergy    Arthritis    Atrial fibrillation (HCC)    CHF (congestive heart failure) (HCC)    Chronic kidney disease    Heart disease    Heart murmur    Hx of actinic keratosis 11/20/2013   under left breast   Hypercholesterolemia    Hypertension    Hyponatremia    Hypothyroidism    Pulmonary HTN (HCC)    Past Surgical History:  Procedure Laterality Date   ABDOMINAL HYSTERECTOMY     CHOLECYSTECTOMY     CORNEAL TRANSPLANT     Right x 3 Left x1   CYST REMOVAL TRUNK     Tail bone   KNEE SURGERY Left    PACEMAKER INSERTION  05/03/13   Social History:  reports that she has never smoked. She has never used smokeless tobacco. She reports that she does not drink alcohol and does  not use drugs.  Allergies  Allergen Reactions   Statins Other (See Comments)    weakness   Sulfa Antibiotics     Caused increased intraocular pressure    Family History  Problem Relation Age of Onset   Diabetes Mother    Heart disease Father    Hyperlipidemia Father    Heart disease Sister    Arthritis Son     Prior to Admission medications   Medication Sig Start Date End Date Taking? Authorizing Provider  apixaban (ELIQUIS) 5 MG TABS tablet Take 5 mg by mouth 2 (two) times daily.     [provider]  carvedilol  (COREG) 3.125 MG tablet Take 1 tablet by mouth in the morning and at bedtime. 11/05/19   [provider]  furosemide (LASIX) 20 MG tablet Take 1 tablet (20 mg total) by mouth daily. 05/05/22 05/05/23  Wouk, Ailene Rud, MD  levothyroxine (SYNTHROID) 50 MCG tablet TAKE 1 TABLET BY MOUTH DAILY 30 TO 60 MINUTES BEFORE BREAKFAST ON AN EMPTY STOMACH AND WITH GLASS OF WATER Patient taking differently: Take 50 mcg by mouth daily before breakfast. 08/08/21   Einar Pheasant, MD  losartan (COZAAR) 50 MG tablet Take 1 tablet (50 mg total) by mouth daily. 11/29/21   Einar Pheasant, MD  pantoprazole (PROTONIX) 40 MG tablet TAKE 1 TABLET(40 MG) BY MOUTH TWICE DAILY 05/19/21   Einar Pheasant, MD  pantoprazole (PROTONIX) 40 MG tablet Take 40 mg by mouth in the morning and at bedtime.    [provider]  spironolactone (ALDACTONE) 25 MG tablet TAKE 1/2 TABLET(12.5 MG) BY MOUTH DAILY 04/30/22   Einar Pheasant, MD  traMADol (ULTRAM) 50 MG tablet TAKE 1 TABLET BY MOUTH 2 TO 3 TIMES DAILY AS NEEDED 04/23/22   Einar Pheasant, MD    Physical Exam: Vitals:   04/29/2022 0425 05/06/2022 0443  BP: (!) 153/73   Pulse: 61   Resp: 14   Temp:  98.8 F (37.1 C)  TempSrc: Axillary Axillary  SpO2: 92%   Weight: 66 kg   Height: _0  (1.651 m)    Physical Exam Vitals and nursing note reviewed.  Constitutional:      General: She is not in acute distress. HENT:     Head: Normocephalic and atraumatic.  Cardiovascular:     Rate and Rhythm: Normal rate and regular rhythm.     Heart sounds: Normal heart sounds.  Pulmonary:     Effort: Pulmonary effort is normal.     Breath sounds: Normal breath sounds.  Abdominal:     Palpations: Abdomen is soft.     Tenderness: There is no abdominal tenderness.  Neurological:     Mental Status: She is lethargic.     GCS: GCS eye subscore is 1. GCS verbal subscore is 1. GCS motor subscore is 1.     Labs on Admission: I have personally reviewed following labs and  imaging studies  CBC: Recent Labs  Lab 05/20/2022 0424  WBC 15.0*  NEUTROABS 13.3*  HGB 11.8*  HCT 35.6*  MCV 88.1  PLT 785   Basic Metabolic Panel: Recent Labs  Lab 05/18/22 1154 04/28/2022 0424  NA 132* 129*  K 3.8 3.7  CL 91* 91*  CO2 33* 27  GLUCOSE 93 158*  BUN 18 17  CREATININE 1.08 0.92  CALCIUM 9.3 8.9   GFR: Estimated Creatinine Clearance: 32.2 mL/min (by C-G formula based on SCr of 0.92 mg/dL). Liver Function Tests: Recent Labs  Lab 05/18/22 1154 05/12/2022 0424  AST 18 19  ALT 13 14  ALKPHOS 62 55  BILITOT 1.0 2.1*  PROT 6.7 7.1  ALBUMIN 4.2 3.9   Recent Labs  Lab 05/09/2022 0424  LIPASE 27   No results for input(s): "AMMONIA" in the last 168 hours. Coagulation Profile: Recent Labs  Lab 04/30/2022 0424  INR 1.8*   Cardiac Enzymes: No results for input(s): "CKTOTAL", "CKMB", "CKMBINDEX", "TROPONINI" in the last 168 hours. BNP (last 3 results) No results for input(s): "PROBNP" in the last 8760 hours. HbA1C: No results for input(s): "HGBA1C" in the last 72 hours. CBG: No results for input(s): "GLUCAP" in the last 168 hours. Lipid Profile: No results for input(s): "CHOL", "HDL", "LDLCALC", "TRIG", "CHOLHDL", "LDLDIRECT" in the last 72 hours. Thyroid Function Tests: No results for input(s): "TSH", "T4TOTAL", "FREET4", "T3FREE", "THYROIDAB" in the last 72 hours. Anemia Panel: No results for input(s): "VITAMINB12", "FOLATE", "FERRITIN", "TIBC", "IRON", "RETICCTPCT" in the last 72 hours. Urine analysis:    Component Value Date/Time   COLORURINE YELLOW 06/23/2019 1424   APPEARANCEUR CLEAR 06/23/2019 1424   APPEARANCEUR Clear 04/25/2015 1427   LABSPEC 1.015 06/23/2019 1424   PHURINE 6.0 06/23/2019 1424   GLUCOSEU NEGATIVE 06/23/2019 1424   HGBUR MODERATE (A) 06/23/2019 1424   BILIRUBINUR NEGATIVE 06/23/2019 1424   BILIRUBINUR Negative 04/25/2015 1427   KETONESUR NEGATIVE 06/23/2019 1424   PROTEINUR Negative 04/25/2015 1427   UROBILINOGEN 0.2  06/23/2019 1424   NITRITE NEGATIVE 06/23/2019 1424   LEUKOCYTESUR MODERATE (A) 06/23/2019 1424    Radiological Exams on Admission: CT Head Wo Contrast  Addendum Date: 04/24/2022   ADDENDUM REPORT: 05/03/2022 05:19 ADDENDUM: Study discussed by telephone with Dr. Hinda Kehr on 05/06/2022 at 0506 hours. He has met with the family, including healthcare power of attorney. And per the patient's wishes she will be made comfort care. Electronically Signed   By: Genevie Ann M.D.   On: 05/01/2022 05:19   Result Date: 05/19/2022 CLINICAL DATA:  88 year old female status post fall 2 days ago. On Eliquis. Altered mental status and vomiting. EXAM: CT HEAD WITHOUT CONTRAST TECHNIQUE: Contiguous axial images were obtained from the base of the skull through the vertex without intravenous contrast. RADIATION DOSE REDUCTION: This exam was performed according to the departmental dose-optimization program which includes automated exposure control, adjustment of the mA and/or kV according to patient size and/or use of iterative reconstruction technique. COMPARISON:  Head CT 03/02/2008. FINDINGS: Brain: There is both a large anterior right frontal lobe intra-axial hematoma (lobulated, 71 x 48 x 45 mm (AP by transverse by CC), and a moderate to large volume of subarachnoid hemorrhage centered at the suprasellar cistern, along the right MCA and sylvian fissure. The intra-axial right frontal lobe blood volume is estimated at 77 mL. And in addition to some convexity subarachnoid hemorrhage, there are small 2-3 mm right side subdural and para falcine subdural hematomas. Some edema associated with the parenchymal hemorrhage. Mass effect on the right lateral ventricle and 4 mm of leftward midline shift at the anterior septum pellucidum, but up to 8 mm at the anterior ACA branches. Small to moderate volume of IVH, visible in both occipital horns. No ventriculomegaly. Basilar cisterns other than subarachnoid blood remain patent. Outside  the areas of edema and hemorrhage gray-white differentiation appears maintained. Vascular: Calcified atherosclerosis at the skull base. No suspicious intracranial vascular hyperdensity. Is visible. Skull: Stable.  No skull fracture identified. Sinuses/Orbits: Visualized paranasal sinuses and mastoids are clear. Other: No discrete orbit or scalp soft tissue injury identified. IMPRESSION:  1. Multifocal Large acute intracranial hemorrhage: - 77 mL anterior right frontal lobe intra-axial hematoma. - moderate volume aneurysmal pattern Subarachnoid Hemorrhage epicenter at the suprasellar cistern and right sylvian fissure. - 2-3 mm right side and para falcine Subdural Hematoma. - moderate volume intraventricular hemorrhage. 2. Recommend stat CTA head and neck and Neurosurgery consultation. 3. Intracranial mass effect on the right lateral ventricle and leftward midline shift of between 4-8 mm. No ventriculomegaly. 4. No skull fracture identified. Electronically Signed: By: Genevie Ann M.D. On: 05/20/2022 04:50   DG Chest Port 1 View  Result Date: 05/13/2022 CLINICAL DATA:  Altered mental status and vomiting. EXAM: PORTABLE CHEST 1 VIEW COMPARISON:  05/04/2022 FINDINGS: There is a left chest wall pacer with lead in the right ventricle. Stable cardiomediastinal contours. There are persistent small bilateral pleural effusions. Pulmonary vascular congestion. No airspace opacities. The visualized osseous structures are unremarkable. IMPRESSION: Small bilateral pleural effusions and pulmonary vascular congestion. Electronically Signed   By: Kerby Moors M.D.   On: 04/28/2022 05:08   CT Cervical Spine Wo Contrast  Result Date: 04/25/2022 CLINICAL DATA:  88 year old female status post fall 2 days ago. On Eliquis. Altered mental status and vomiting. EXAM: CT CERVICAL SPINE WITHOUT CONTRAST TECHNIQUE: Multidetector CT imaging of the cervical spine was performed without intravenous contrast. Multiplanar CT image  reconstructions were also generated. RADIATION DOSE REDUCTION: This exam was performed according to the departmental dose-optimization program which includes automated exposure control, adjustment of the mA and/or kV according to patient size and/or use of iterative reconstruction technique. COMPARISON:  Head CT today reported separately. FINDINGS: Alignment: Relatively maintained cervical lordosis with degenerative appearing anterolisthesis C4-C5 (2-3 mm) and to a lesser extent C3-C4. Cervicothoracic junction alignment is within normal limits. Bilateral posterior element alignment is within normal limits. Skull base and vertebrae: Visualized skull base is intact. No atlanto-occipital dissociation. Advanced degeneration at the anterior C1 odontoid, but C1 and C2 appear to remain intact and aligned. No acute osseous abnormality identified. Soft tissues and spinal canal: No prevertebral fluid or swelling. No visible canal hematoma. Negative noncontrast visible neck soft tissues aside from motion artifact and calcified carotid atherosclerosis. Disc levels: Marland Kitchen Chronic severe C1-odontoid degeneration. Possible developing ankylosis there. Bulky chronic disc and endplate degeneration O3-J0 and C6-C7. Some superimposed posterior element hypertrophy at those levels. Mild spinal stenosis suspected at both. Upper chest: Left chest pacemaker type leads subclavian approach. Layering pleural effusions appear to be at least moderate on the right, smaller on the left. Lung apices otherwise well aerated. Grossly intact visible upper thoracic levels. IMPRESSION: 1. No acute traumatic injury identified in the cervical spine. 2. Advanced cervical spine degeneration, including at the C1-odontoid articulation. Mild spinal stenosis suspected at C5-C6 and C6-C7. 3. Right > left layering pleural effusions. Electronically Signed   By: Genevie Ann M.D.   On: 05/08/2022 04:54     Data Reviewed: Relevant notes from primary care and specialist  visits, past discharge summaries as available in EHR, including Care Everywhere. Prior diagnostic testing as pertinent to current admission diagnoses Updated medications and problem lists for reconciliation ED course, including vitals, labs, imaging, treatment and response to treatment Triage notes, nursing and pharmacy notes and ED provider's notes Notable results as noted in HPI   Assessment and Plan: * Intracranial hemorrhage with coma(HCC) History of recent fall Chronic anticoagulation Comfort care measures only per orders Inpatient hospice referral  Other acute medical problems: Aspiration pneumonitis Hyponatremia Hypoxia  Other chronic medical problems Congestive heart failure Atrial  fibrillation Essential hypertension Hypothyroidism Cardiac pacemaker Pulmonary hypertension   DVT prophylaxis: None due to comfort care  Consults: none  Advance Care Planning:   Code Status: DNR   Family Communication: Son at bedside  Disposition Plan: Back to previous home environment  Severity of Illness: The appropriate patient status for this patient is INPATIENT. Inpatient status is judged to be reasonable and necessary in order to provide the required intensity of service to ensure the patient's safety. The patient's presenting symptoms, physical exam findings, and initial radiographic and laboratory data in the context of their chronic comorbidities is felt to place them at high risk for further clinical deterioration. Furthermore, it is not anticipated that the patient will be medically stable for discharge from the hospital within 2 midnights of admission.   * I certify that at the point of admission it is my clinical judgment that the patient will require inpatient hospital care spanning beyond 2 midnights from the point of admission due to high intensity of service, high risk for further deterioration and high frequency of surveillance required.*  Author: Athena Masse,  MD 05/18/2022 5:27 AM  For on call review [email or web address].

## 2022-05-22 DIAGNOSIS — I629 Nontraumatic intracranial hemorrhage, unspecified: Principal | ICD-10-CM

## 2022-05-22 DIAGNOSIS — I10 Essential (primary) hypertension: Secondary | ICD-10-CM | POA: Diagnosis not present

## 2022-05-22 DIAGNOSIS — I272 Pulmonary hypertension, unspecified: Secondary | ICD-10-CM

## 2022-05-22 LAB — GLUCOSE, CAPILLARY: Glucose-Capillary: 104 mg/dL — ABNORMAL HIGH (ref 70–99)

## 2022-05-22 MED ORDER — DIPHENHYDRAMINE HCL 50 MG/ML IJ SOLN: 25.0000 mg | INTRAMUSCULAR | Status: DC | PRN

## 2022-05-22 MED ORDER — SODIUM CHLORIDE 0.9 % IV SOLN: INTRAVENOUS | Status: DC

## 2022-05-22 MED ORDER — LORAZEPAM 2 MG/ML IJ SOLN: 2.0000 mg | INTRAMUSCULAR | Status: DC | PRN

## 2022-05-22 MED ORDER — MORPHINE 100MG IN NS 100ML (1MG/ML) PREMIX INFUSION
0.0000 mg/h | INTRAVENOUS | Status: DC
  Administered 2022-05-22: 5 mg/h via INTRAVENOUS
  Filled 2022-05-22: qty 100

## 2022-05-22 MED ORDER — MORPHINE BOLUS VIA INFUSION: 5.0000 mg | INTRAVENOUS | Status: DC | PRN

## 2022-05-23 NOTE — Progress Notes (Signed)
       CROSS COVER NOTE  NAME: Heidi Baldwin MRN: [id number] DOB : 1923-10-30 ATTENDING PHYSICIAN: Richarda Osmond, MD     Notified by RN that patient has expired at 2008  Patient was DNR and comfort care  2 RN verified.  Family was present at bedside and available to RN.    This document was prepared using Dragon voice recognition software and may include unintentional dictation errors.  Neomia Glass DNP, MBA, FNP-BC Nurse Practitioner [facility] Pager [phone]

## 2022-05-23 NOTE — Plan of Care (Signed)
  Problem: Clinical Measurements: Goal: Quality of life will improve Outcome: Progressing   Problem: Respiratory: Goal: Verbalizations of increased ease of respirations will increase Outcome: Progressing

## 2022-05-23 NOTE — Progress Notes (Signed)
PROGRESS NOTE  Heidi Baldwin    DOB: 11-01-1923, 88 y.o.  TGP:[id number]    Code Status: DNR   DOA: 05/11/2022   LOS: 1   Brief hospital course  Heidi Baldwin is a 88 y.o. female with a PMH significant for Atrial fibrillation on Eliquis, HTN, sick sinus syndrome s/p pacemaker, pulmonary hypertension, hypothyroidism, CKD stage IIIb, hospitalized recently from 10/12 to 05/05/2022 with CHF exacerbation and pleural effusion requiring thoracentesis.  They presented from home to the ED on 04/28/2022 after a fall and found unresponsive. She had suffered several falls in recent history but declined presentation to hospital.  In the ED, it was found that they had mostly stable vital signs.  Significant findings included CT head with large acute intracranial hemorrhage with 4-27m midline shift as well as several other abnormalities. Neurosurgery was consulted.   They were initially treated with analgesics and antiemetics.  Family decided based on prognosis to pursue comfort care on admission.  Patient was admitted to medicine service for further workup and management of comfort care as outlined in detail below.  1November 11, 2023-family expresses interest in admission to hospice house. Continue comfort measures.   Assessment & Plan  Principal Problem:   Intracranial hemorrhage with coma(HCC) Active Problems:   History of recent fall   Chronic anticoagulation   Atrial fibrillation (HCC)   Essential hypertension, benign   Hypothyroidism   Pulmonary hypertension (HCC)   Hypoxia   Hyponatremia   [place]  GOC- per patient request and family agreement, patient transitioned to comfort care 10/30. All possible painful, anxiety provoking, invasive or life prolonging measures were discontinued at that time.  - continuous morphine gtt and PRN boluses for air hunger - other comfort measures per orders  Severe intracranial hemorrhage with midline shift. Patient is in and out of sedative  state. Has been able to recognize son this morning. She is unresponsive to my conversation today. She has pulled out her IV and per son, she is tugging on her foley - continue analgesia, antiemetic, antianxiety as needed  Body mass index is 23.7 kg/m.  VTE ppx: none  Diet:     Diet   Diet NPO time specified   Consultants: Neurosurgery hospice Subjective 111/05/2022   Pt is unable to respond. Son states that she did recognize him this morning and has been tugging on her IV/foley   Objective   Vitals:   05/02/2022 2304 111/[id number] 111/11/230126 1Nov 11, [id number]  BP: (!) 106/48  (!) 138/56 (!) 162/69  Pulse: 63  61 (!) 59  Resp: _0 Temp: 98.1 F (36.7 C)  98.3 F (36.8 C) 98 F (36.7 C)  TempSrc: Oral     SpO2: 98%  96% 95%  Weight:  64.6 kg    Height:  _1  (1.651 m)      Intake/Output Summary (Last 24 hours) at 1Nov 11, [id number] Last data filed at [id number] per 24 hour  Intake --  Output 600 ml  Net -600 ml   Filed Weights   04/22/2022 0425 [id number]  Weight: 66 kg 64.6 kg     Physical Exam:  General: asleep and comfortable appearing Respiratory: normal respiratory effort. Cardiovascular: quick capillary refill Nervous: unresponsive. Spontaneous movement of arms Extremities: moves all equally, no edema, normal tone Skin: dry, intact, normal temperature, normal color. No rashes, lesions or ulcers on exposed skin  Labs   I have personally reviewed the following labs and imaging  studies CBC    Component Value Date/Time   WBC 15.0 (H) 04/28/2022 0424   RBC 4.04 05/18/2022 0424   HGB 11.8 (L) 05/05/2022 0424   HGB 12.1 03/31/2013 1421   HCT 35.6 (L) 05/11/2022 0424   HCT 35.9 03/31/2013 1421   PLT 173 05/20/2022 0424   PLT 219 03/31/2013 1421   MCV 88.1 05/11/2022 0424   MCV 84 03/31/2013 1421   MCH 29.2 05/03/2022 0424   MCHC 33.1 05/08/2022 0424   RDW 12.3 05/01/2022 0424   RDW 14.3 03/31/2013 1421   LYMPHSABS 0.5 (L) 04/22/2022  0424   LYMPHSABS 1.5 03/31/2013 1421   MONOABS 1.1 (H) 05/01/2022 0424   MONOABS 0.6 03/31/2013 1421   EOSABS 0.0 05/19/2022 0424   EOSABS 0.1 03/31/2013 1421   BASOSABS 0.1 04/27/2022 0424   BASOSABS 0.1 03/31/2013 1421      Latest Ref Rng & Units 05/20/2022    4:24 AM 05/18/2022   11:54 AM 05/05/2022    4:48 AM  BMP  Glucose 70 - 99 mg/dL 158  93  59   BUN 8 - 23 mg/dL _0 Creatinine 0.44 - 1.00 mg/dL 0.92  1.08  1.11   Sodium 135 - 145 mmol/L 129  132  133   Potassium 3.5 - 5.1 mmol/L 3.7  3.8  3.5   Chloride 98 - 111 mmol/L 91  91  91   CO2 22 - 32 mmol/L 27  33  26   Calcium 8.9 - 10.3 mg/dL 8.9  9.3  9.1     CT Head Wo Contrast  Addendum Date: 05/17/2022   ADDENDUM REPORT: 04/26/2022 05:19 ADDENDUM: Study discussed by telephone with Dr. Hinda Kehr on 05/16/2022 at 0506 hours. He has met with the family, including healthcare power of attorney. And per the patient's wishes she will be made comfort care. Electronically Signed   By: Genevie Ann M.D.   On: 05/05/2022 05:19   Result Date: 04/28/2022 CLINICAL DATA:  88 year old female status post fall 2 days ago. On Eliquis. Altered mental status and vomiting. EXAM: CT HEAD WITHOUT CONTRAST TECHNIQUE: Contiguous axial images were obtained from the base of the skull through the vertex without intravenous contrast. RADIATION DOSE REDUCTION: This exam was performed according to the departmental dose-optimization program which includes automated exposure control, adjustment of the mA and/or kV according to patient size and/or use of iterative reconstruction technique. COMPARISON:  Head CT 03/02/2008. FINDINGS: Brain: There is both a large anterior right frontal lobe intra-axial hematoma (lobulated, 71 x 48 x 45 mm (AP by transverse by CC), and a moderate to large volume of subarachnoid hemorrhage centered at the suprasellar cistern, along the right MCA and sylvian fissure. The intra-axial right frontal lobe blood volume is estimated  at 77 mL. And in addition to some convexity subarachnoid hemorrhage, there are small 2-3 mm right side subdural and para falcine subdural hematomas. Some edema associated with the parenchymal hemorrhage. Mass effect on the right lateral ventricle and 4 mm of leftward midline shift at the anterior septum pellucidum, but up to 8 mm at the anterior ACA branches. Small to moderate volume of IVH, visible in both occipital horns. No ventriculomegaly. Basilar cisterns other than subarachnoid blood remain patent. Outside the areas of edema and hemorrhage gray-white differentiation appears maintained. Vascular: Calcified atherosclerosis at the skull base. No suspicious intracranial vascular hyperdensity. Is visible. Skull: Stable.  No skull fracture identified. Sinuses/Orbits: Visualized paranasal sinuses and mastoids are clear.  Other: No discrete orbit or scalp soft tissue injury identified. IMPRESSION: 1. Multifocal Large acute intracranial hemorrhage: - 77 mL anterior right frontal lobe intra-axial hematoma. - moderate volume aneurysmal pattern Subarachnoid Hemorrhage epicenter at the suprasellar cistern and right sylvian fissure. - 2-3 mm right side and para falcine Subdural Hematoma. - moderate volume intraventricular hemorrhage. 2. Recommend stat CTA head and neck and Neurosurgery consultation. 3. Intracranial mass effect on the right lateral ventricle and leftward midline shift of between 4-8 mm. No ventriculomegaly. 4. No skull fracture identified. Electronically Signed: By: Genevie Ann M.D. On: 05/02/2022 04:50   DG Chest Port 1 View  Result Date: 04/22/2022 CLINICAL DATA:  Altered mental status and vomiting. EXAM: PORTABLE CHEST 1 VIEW COMPARISON:  05/04/2022 FINDINGS: There is a left chest wall pacer with lead in the right ventricle. Stable cardiomediastinal contours. There are persistent small bilateral pleural effusions. Pulmonary vascular congestion. No airspace opacities. The visualized osseous structures are  unremarkable. IMPRESSION: Small bilateral pleural effusions and pulmonary vascular congestion. Electronically Signed   By: Kerby Moors M.D.   On: 05/10/2022 05:08   CT Cervical Spine Wo Contrast  Result Date: 05/06/2022 CLINICAL DATA:  88 year old female status post fall 2 days ago. On Eliquis. Altered mental status and vomiting. EXAM: CT CERVICAL SPINE WITHOUT CONTRAST TECHNIQUE: Multidetector CT imaging of the cervical spine was performed without intravenous contrast. Multiplanar CT image reconstructions were also generated. RADIATION DOSE REDUCTION: This exam was performed according to the departmental dose-optimization program which includes automated exposure control, adjustment of the mA and/or kV according to patient size and/or use of iterative reconstruction technique. COMPARISON:  Head CT today reported separately. FINDINGS: Alignment: Relatively maintained cervical lordosis with degenerative appearing anterolisthesis C4-C5 (2-3 mm) and to a lesser extent C3-C4. Cervicothoracic junction alignment is within normal limits. Bilateral posterior element alignment is within normal limits. Skull base and vertebrae: Visualized skull base is intact. No atlanto-occipital dissociation. Advanced degeneration at the anterior C1 odontoid, but C1 and C2 appear to remain intact and aligned. No acute osseous abnormality identified. Soft tissues and spinal canal: No prevertebral fluid or swelling. No visible canal hematoma. Negative noncontrast visible neck soft tissues aside from motion artifact and calcified carotid atherosclerosis. Disc levels: Marland Kitchen Chronic severe C1-odontoid degeneration. Possible developing ankylosis there. Bulky chronic disc and endplate degeneration M1-Y7 and C6-C7. Some superimposed posterior element hypertrophy at those levels. Mild spinal stenosis suspected at both. Upper chest: Left chest pacemaker type leads subclavian approach. Layering pleural effusions appear to be at least moderate on  the right, smaller on the left. Lung apices otherwise well aerated. Grossly intact visible upper thoracic levels. IMPRESSION: 1. No acute traumatic injury identified in the cervical spine. 2. Advanced cervical spine degeneration, including at the C1-odontoid articulation. Mild spinal stenosis suspected at C5-C6 and C6-C7. 3. Right > left layering pleural effusions. Electronically Signed   By: Genevie Ann M.D.   On: 05/19/2022 04:54    Disposition Plan & Communication  Patient status: Inpatient  Admitted From: Home Planned disposition location: Hospice care Anticipated discharge date: 11/1 pending hospice placement. At this time she would be stable to transport but could potentially expire in hospital prior to transfer  Family Communication: son at bedside    Author: Richarda Osmond, DO [facility] 05/26/22, 7:13 AM   Available by Epic secure chat 7AM-7PM. If 7PM-7AM, please contact night-coverage.  [facility] contact information found on [email or web address].

## 2022-05-23 NOTE — Progress Notes (Signed)
Called to pt room by family to check pt. Pt with no respirations,no pulse. Family at bedside informed. Charge nurse notified.

## 2022-05-23 DEATH — deceased

## 2022-06-22 NOTE — Death Summary Note (Signed)
DEATH SUMMARY   Patient Details  Name: Heidi Baldwin MRN: [id number] DOB: Dec 19, 1923 JQG:BEEFE, Randell Patient, MD Admission/Discharge Information   Admit Date:  June 02, 2022  Date of Death: Date of Death: 06/03/2022  Time of Death: Time of Death: 09-15-2006  Length of Stay: 1   Principle Cause of death: Severe intracranial hemorrhage with midline shift after fall at home with presumed head injury  Hospital Diagnoses: Principal Problem:   Intracranial hemorrhage with coma(HCC) Active Problems:   History of recent fall   Chronic anticoagulation   Atrial fibrillation ([place])   Essential hypertension, benign   Hypothyroidism   Pulmonary hypertension (HCC)   Hypoxia   Hyponatremia   Aspiration pneumonia (HCC)   Acute intracranial hemorrhage [facility])   Hospital Course: Heidi Baldwin was a 88 y.o. female with a PMH significant for Atrial fibrillation on Eliquis, HTN, sick sinus syndrome s/p pacemaker, pulmonary hypertension, hypothyroidism, CKD stage IIIb, hospitalized recently from 10/12 to 05/05/2022 with CHF exacerbation and pleural effusion requiring thoracentesis.   They presented from home to the ED on 06/02/2022 after a fall and found unresponsive. She had suffered several falls in recent history but declined presentation to hospital.   In the ED, it was found that they had mostly stable vital signs.  Significant findings included CT head with large acute intracranial hemorrhage with 4-20m midline shift as well as several other abnormalities. Neurosurgery was consulted.  They were initially treated with analgesics and antiemetics.  Family decided based on prognosis to pursue comfort care on admission. She passed away in the hospital of natural causes with her loved ones at her bedside.   Procedures: CT head, neck  Consultations: neurosurgery, palliative, hospice  The results of significant diagnostics from this hospitalization (including imaging, microbiology, ancillary and laboratory) are  listed below for reference.   Significant Diagnostic Studies: CT Head Wo Contrast  Addendum Date: 1Nov 11, 2023  ADDENDUM REPORT: 1Nov 11, 202305:19 ADDENDUM: Study discussed by telephone with Dr. CHinda Kehron [id number] 0506 hours. He has met with the family, including healthcare power of attorney. And per the patient's wishes she will be made comfort care. Electronically Signed   By: HGenevie AnnM.D.   On: 111-05-2304:19   Result Date: 111/11/23CLINICAL DATA:  88year old female status post fall 2 days ago. On Eliquis. Altered mental status and vomiting. EXAM: CT HEAD WITHOUT CONTRAST TECHNIQUE: Contiguous axial images were obtained from the base of the skull through the vertex without intravenous contrast. RADIATION DOSE REDUCTION: This exam was performed according to the departmental dose-optimization program which includes automated exposure control, adjustment of the mA and/or kV according to patient size and/or use of iterative reconstruction technique. COMPARISON:  Head CT 03/02/2008. FINDINGS: Brain: There is both a large anterior right frontal lobe intra-axial hematoma (lobulated, 71 x 48 x 45 mm (AP by transverse by CC), and a moderate to large volume of subarachnoid hemorrhage centered at the suprasellar cistern, along the right MCA and sylvian fissure. The intra-axial right frontal lobe blood volume is estimated at 77 mL. And in addition to some convexity subarachnoid hemorrhage, there are small 2-3 mm right side subdural and para falcine subdural hematomas. Some edema associated with the parenchymal hemorrhage. Mass effect on the right lateral ventricle and 4 mm of leftward midline shift at the anterior septum pellucidum, but up to 8 mm at the anterior ACA branches. Small to moderate volume of IVH, visible in both occipital horns. No ventriculomegaly. Basilar cisterns other than subarachnoid blood remain  patent. Outside the areas of edema and hemorrhage gray-white differentiation appears  maintained. Vascular: Calcified atherosclerosis at the skull base. No suspicious intracranial vascular hyperdensity. Is visible. Skull: Stable.  No skull fracture identified. Sinuses/Orbits: Visualized paranasal sinuses and mastoids are clear. Other: No discrete orbit or scalp soft tissue injury identified. IMPRESSION: 1. Multifocal Large acute intracranial hemorrhage: - 77 mL anterior right frontal lobe intra-axial hematoma. - moderate volume aneurysmal pattern Subarachnoid Hemorrhage epicenter at the suprasellar cistern and right sylvian fissure. - 2-3 mm right side and para falcine Subdural Hematoma. - moderate volume intraventricular hemorrhage. 2. Recommend stat CTA head and neck and Neurosurgery consultation. 3. Intracranial mass effect on the right lateral ventricle and leftward midline shift of between 4-8 mm. No ventriculomegaly. 4. No skull fracture identified. Electronically Signed: By: Genevie Ann M.D. On: 05/13/2022 04:50   DG Chest Port 1 View  Result Date: 05/15/2022 CLINICAL DATA:  Altered mental status and vomiting. EXAM: PORTABLE CHEST 1 VIEW COMPARISON:  05/04/2022 FINDINGS: There is a left chest wall pacer with lead in the right ventricle. Stable cardiomediastinal contours. There are persistent small bilateral pleural effusions. Pulmonary vascular congestion. No airspace opacities. The visualized osseous structures are unremarkable. IMPRESSION: Small bilateral pleural effusions and pulmonary vascular congestion. Electronically Signed   By: Kerby Moors M.D.   On: 05/20/2022 05:08   CT Cervical Spine Wo Contrast  Result Date: 04/22/2022 CLINICAL DATA:  88 year old female status post fall 2 days ago. On Eliquis. Altered mental status and vomiting. EXAM: CT CERVICAL SPINE WITHOUT CONTRAST TECHNIQUE: Multidetector CT imaging of the cervical spine was performed without intravenous contrast. Multiplanar CT image reconstructions were also generated. RADIATION DOSE REDUCTION: This exam was  performed according to the departmental dose-optimization program which includes automated exposure control, adjustment of the mA and/or kV according to patient size and/or use of iterative reconstruction technique. COMPARISON:  Head CT today reported separately. FINDINGS: Alignment: Relatively maintained cervical lordosis with degenerative appearing anterolisthesis C4-C5 (2-3 mm) and to a lesser extent C3-C4. Cervicothoracic junction alignment is within normal limits. Bilateral posterior element alignment is within normal limits. Skull base and vertebrae: Visualized skull base is intact. No atlanto-occipital dissociation. Advanced degeneration at the anterior C1 odontoid, but C1 and C2 appear to remain intact and aligned. No acute osseous abnormality identified. Soft tissues and spinal canal: No prevertebral fluid or swelling. No visible canal hematoma. Negative noncontrast visible neck soft tissues aside from motion artifact and calcified carotid atherosclerosis. Disc levels: Marland Kitchen Chronic severe C1-odontoid degeneration. Possible developing ankylosis there. Bulky chronic disc and endplate degeneration A5-W9 and C6-C7. Some superimposed posterior element hypertrophy at those levels. Mild spinal stenosis suspected at both. Upper chest: Left chest pacemaker type leads subclavian approach. Layering pleural effusions appear to be at least moderate on the right, smaller on the left. Lung apices otherwise well aerated. Grossly intact visible upper thoracic levels. IMPRESSION: 1. No acute traumatic injury identified in the cervical spine. 2. Advanced cervical spine degeneration, including at the C1-odontoid articulation. Mild spinal stenosis suspected at C5-C6 and C6-C7. 3. Right > left layering pleural effusions. Electronically Signed   By: Genevie Ann M.D.   On: 04/24/2022 04:54   ECHOCARDIOGRAM COMPLETE  Result Date: 05/04/2022    ECHOCARDIOGRAM REPORT   Patient Name:   RUKIYA HODGKINS Date of Exam: 05/04/2022 Medical Rec  #:  [id number]    Height:       64.0 in Accession #:    [id number]   Weight:  147.0 lb Date of Birth:  30-May-1924     BSA:          1.716 m Patient Age:    34 years     BP:           120/55 mmHg Patient Gender: F            HR:           62 bpm. Exam Location:  [facility] Procedure: 2D Echo Indications:    Dyspnea  History:        Patient has no prior history of Echocardiogram examinations.                 Pacemaker, Arrythmias:Atrial Fibrillation and Bradycardia; Risk                 Factors:Hypertension.  Sonographer:    Harvie Junior Referring Phys: [place]  1. Left ventricular ejection fraction, by estimation, is 45 to 50%. The left ventricle has mildly decreased function. The left ventricle demonstrates global hypokinesis. Left ventricular diastolic parameters are consistent with Grade III diastolic dysfunction (restrictive).  2. Right ventricular systolic function is normal. The right ventricular size is mildly enlarged. There is normal pulmonary artery systolic pressure.  3. Left atrial size was mildly dilated.  4. Right atrial size was mildly dilated.  5. The mitral valve is normal in structure. Mild mitral valve regurgitation.  6. The aortic valve is calcified. Aortic valve regurgitation is trivial. FINDINGS  Left Ventricle: Left ventricular ejection fraction, by estimation, is 45 to 50%. The left ventricle has mildly decreased function. The left ventricle demonstrates global hypokinesis. The left ventricular internal cavity size was normal in size. There is  borderline concentric left ventricular hypertrophy. Left ventricular diastolic parameters are consistent with Grade III diastolic dysfunction (restrictive). Right Ventricle: The right ventricular size is mildly enlarged. No increase in right ventricular wall thickness. Right ventricular systolic function is normal. There is normal pulmonary artery systolic pressure. The tricuspid regurgitant velocity is 2.57  m/s, and with an  assumed right atrial pressure of 3 mmHg, the estimated right ventricular systolic pressure is 45.8 mmHg. Left Atrium: Left atrial size was mildly dilated. Right Atrium: Right atrial size was mildly dilated. Pericardium: There is no evidence of pericardial effusion. Mitral Valve: The mitral valve is normal in structure. Mild mitral valve regurgitation. MV peak gradient, 7.8 mmHg. The mean mitral valve gradient is 3.0 mmHg. Tricuspid Valve: The tricuspid valve is normal in structure. Tricuspid valve regurgitation is mild. Aortic Valve: The aortic valve is calcified. Aortic valve regurgitation is trivial. Aortic valve mean gradient measures 5.5 mmHg. Aortic valve peak gradient measures 11.2 mmHg. Aortic valve area, by VTI measures 2.32 cm. Pulmonic Valve: The pulmonic valve was grossly normal. Pulmonic valve regurgitation is mild. No evidence of pulmonic stenosis. Aorta: The ascending aorta was not well visualized. IAS/Shunts: No atrial level shunt detected by color flow Doppler. Additional Comments: A device lead is visualized. There is a small pleural effusion in the left lateral region.  LEFT VENTRICLE PLAX 2D LVIDd:         3.40 cm     Diastology LVIDs:         2.70 cm     LV e' medial:    9.46 cm/s LV PW:         0.90 cm     LV E/e' medial:  14.2 LV IVS:        1.20 cm  LV e' lateral:   10.30 cm/s LVOT diam:     1.80 cm     LV E/e' lateral: 13.0 LV SV:         76 LV SV Index:   44 LVOT Area:     2.54 cm  LV Volumes (MOD) LV vol d, MOD A2C: 53.4 ml LV vol d, MOD A4C: 48.0 ml LV vol s, MOD A2C: 20.2 ml LV vol s, MOD A4C: 21.2 ml LV SV MOD A2C:     33.2 ml LV SV MOD A4C:     48.0 ml LV SV MOD BP:      30.9 ml RIGHT VENTRICLE RV Basal diam:  4.00 cm RV Mid diam:    4.00 cm RV S prime:     12.60 cm/s TAPSE (M-mode): 1.0 cm LEFT ATRIUM              Index        RIGHT ATRIUM           Index LA diam:        4.20 cm  2.45 cm/m   RA Area:     18.50 cm LA Vol (A2C):   103.0 ml 60.01 ml/m  RA Volume:   46.10 ml  26.86  ml/m LA Vol (A4C):   87.4 ml  50.92 ml/m LA Biplane Vol: 95.0 ml  55.35 ml/m  AORTIC VALVE                     PULMONIC VALVE AV Area (Vmax):    2.43 cm      PV Vmax:          1.06 m/s AV Area (Vmean):   2.36 cm      PV Peak grad:     4.5 mmHg AV Area (VTI):     2.32 cm      PR End Diast Vel: 6.45 msec AV Vmax:           167.50 cm/s AV Vmean:          110.000 cm/s AV VTI:            0.328 m AV Peak Grad:      11.2 mmHg AV Mean Grad:      5.5 mmHg LVOT Vmax:         160.00 cm/s LVOT Vmean:        102.000 cm/s LVOT VTI:          0.299 m LVOT/AV VTI ratio: 0.91  AORTA Ao Root diam: 3.10 cm MITRAL VALVE                TRICUSPID VALVE MV Area (PHT): 2.39 cm     TR Peak grad:   26.4 mmHg MV Area VTI:   1.90 cm     TR Vmax:        257.00 cm/s MV Peak grad:  7.8 mmHg MV Mean grad:  3.0 mmHg     SHUNTS MV Vmax:       1.40 m/s     Systemic VTI:  0.30 m MV Vmean:      73.1 cm/s    Systemic Diam: 1.80 cm MV Decel Time: 317 msec MR Peak grad: 48.3 mmHg MR Mean grad: 41.0 mmHg MR Vmax:      347.67 cm/s MR Vmean:     303.0 cm/s MV E velocity: 134.00 cm/s MV A velocity: 31.60 cm/s MV E/A ratio:  4.24 Yolonda Kida MD Electronically  signed by Yolonda Kida MD Signature Date/Time: 05/04/2022/4:42:32 PM    Final    US THORACENTESIS ASP PLEURAL SPACE W/IMG GUIDE  Result Date: 05/04/2022 INDICATION: Right pleural effusion EXAM: ULTRASOUND GUIDED RIGHT THORACENTESIS MEDICATIONS: 6 cc 1% lidocaine COMPLICATIONS: None immediate.  No pneumothorax on follow-up radiograph. PROCEDURE: An ultrasound guided thoracentesis was thoroughly discussed with the patient and questions answered. The benefits, risks, alternatives and complications were also discussed. The patient understands and wishes to proceed with the procedure. Written consent was obtained. Ultrasound was performed to localize and mark an adequate pocket of fluid in the right chest. The area was then prepped and draped in the normal sterile fashion. 1% Lidocaine  was used for local anesthesia. Under ultrasound guidance a 6 Fr Safe-T-Centesis catheter was introduced. Thoracentesis was performed. The catheter was removed and a dressing applied. FINDINGS: A total of approximately 1.2 L of clear yellow fluid was removed. Samples were sent to the laboratory as requested by the clinical team. IMPRESSION: Successful ultrasound guided right thoracentesis yielding 1.2 L of pleural fluid. Read by: Reatha Armour, PA-C Electronically Signed   By: Lucrezia Europe M.D.   On: 05/04/2022 14:58   DG Chest Port 1 View  Result Date: 05/04/2022 CLINICAL DATA:  Thoracentesis. EXAM: PORTABLE CHEST 1 VIEW COMPARISON:  Chest x-ray October 12, 23. FINDINGS: Bilateral pleural effusions, decreased in size on the right. Probable overlying atelectasis. No visible pneumothorax. Unchanged cardiomediastinal silhouette. Left subclavian approach cardiac rhythm maintenance device. IMPRESSION: Bilateral pleural effusions, decreased in size on the right. Probable overlying atelectasis. No visible pneumothorax. Electronically Signed   By: Margaretha Sheffield M.D.   On: 05/04/2022 12:21   CT CHEST WO CONTRAST  Result Date: 05/03/2022 CLINICAL DATA:  Pleural effusion, malignancy suspected EXAM: CT CHEST WITHOUT CONTRAST TECHNIQUE: Multidetector CT imaging of the chest was performed following the standard protocol without IV contrast. RADIATION DOSE REDUCTION: This exam was performed according to the departmental dose-optimization program which includes automated exposure control, adjustment of the mA and/or kV according to patient size and/or use of iterative reconstruction technique. COMPARISON:  CT abdomen and pelvis 03/04/2015 FINDINGS: Cardiovascular: Cardiac enlargement. Calcification in the mitral valve annulus. Coronary artery and aortic calcification. No aortic aneurysm. Mediastinum/Nodes: Esophagus is decompressed. Thyroid gland is unremarkable. Mediastinal lymph nodes are not pathologically enlarged,  likely reactive. Cardiac pacemaker. Lungs/Pleura: Large bilateral pleural effusions with bilateral basilar infiltrates. This likely represents compressive atelectasis but could indicate multifocal pneumonia in the appropriate clinical setting. Bronchial wall thickening consistent with chronic bronchitis. No pneumothorax. Upper Abdomen: Large heterogeneous mass in the upper pole of the right kidney measuring 7.5 x 8.6 cm. This is enlarging since the previous study and likely represents renal cell carcinoma. Characterization is limited on noncontrast imaging. The mass is incompletely included within the field of view. Surgical absence of the gallbladder. Musculoskeletal: Degenerative changes.  No focal bone lesions. IMPRESSION: 1. Large bilateral pleural effusions with basilar atelectasis or consolidation. 2. 8.6 cm diameter solid mass in the upper pole of the right kidney is enlarging since prior study and likely represents renal cell carcinoma. 3. Cardiac enlargement. Calcification of the mitral valve annulus. Aortic atherosclerosis. Electronically Signed   By: Lucienne Capers M.D.   On: 05/03/2022 22:05   DG Chest 2 View  Result Date: 05/03/2022 CLINICAL DATA:  Shortness of breath EXAM: CHEST - 2 VIEW COMPARISON:  Chest radiograph dated 04/02/2013. FINDINGS: The heart is borderline enlarged. Vascular calcifications are seen in the aortic arch. There is a small  to moderate right pleural effusion with associated atelectasis/airspace disease. The left lung is clear with no pleural effusion. There is no pneumothorax. A left subclavian approach cardiac device is redemonstrated. Degenerative changes are seen in the spine. IMPRESSION: Small to moderate right pleural effusion with associated atelectasis/airspace disease. Aortic Atherosclerosis (ICD10-I70.0). Electronically Signed   By: Zerita Boers M.D.   On: 05/03/2022 12:56    Microbiology: No results found for this or any previous visit (from the past 240  hour(s)).  Time spent: >20 minutes  Signed: Richarda Osmond, MD May 30, 2022

## 2022-07-05 ENCOUNTER — Ambulatory Visit: Payer: Medicare HMO | Admitting: Family
